# Patient Record
Sex: Male | Born: 1955 | Race: White | Hispanic: No | Marital: Married | Smoking: Never smoker
Health system: Southern US, Community
[De-identification: ages and names within clinical notes are randomized; demographics above are authoritative.]

## PROBLEM LIST (undated history)

## (undated) DIAGNOSIS — G459 Transient cerebral ischemic attack, unspecified: Secondary | ICD-10-CM

## (undated) DIAGNOSIS — I10 Essential (primary) hypertension: Secondary | ICD-10-CM

## (undated) DIAGNOSIS — I639 Cerebral infarction, unspecified: Secondary | ICD-10-CM

## (undated) DIAGNOSIS — M1990 Unspecified osteoarthritis, unspecified site: Secondary | ICD-10-CM

## (undated) DIAGNOSIS — R011 Cardiac murmur, unspecified: Secondary | ICD-10-CM

## (undated) HISTORY — PX: APPENDECTOMY (OPEN): SHX54

## (undated) HISTORY — PX: EYE SURGERY: SHX253

## (undated) HISTORY — PX: APPENDECTOMY: SHX54

## (undated) HISTORY — PX: COLONOSCOPY: SHX174

## (undated) HISTORY — PX: ESOPHAGOGASTRODUODENOSCOPY: SHX1529

## (undated) HISTORY — PX: OTHER SURGICAL HISTORY: SHX169

---

## 1997-10-29 ENCOUNTER — Other Ambulatory Visit: Admission: RE | Admit: 1997-10-29 | Discharge: 1997-10-29 | Payer: Self-pay | Admitting: Family Medicine

## 1998-02-08 ENCOUNTER — Other Ambulatory Visit: Admission: RE | Admit: 1998-02-08 | Discharge: 1998-02-08 | Payer: Self-pay | Admitting: Family Medicine

## 2014-11-07 ENCOUNTER — Observation Stay
Admission: EM | Admit: 2014-11-07 | Discharge: 2014-11-09 | Disposition: A | Discharge status: Home / Self Care | Payer: Commercial Managed Care - POS | Attending: Internal Medicine | Admitting: Internal Medicine

## 2014-11-07 ENCOUNTER — Observation Stay: Payer: Commercial Managed Care - POS

## 2014-11-07 ENCOUNTER — Observation Stay: Payer: Commercial Managed Care - POS | Admitting: Internal Medicine

## 2014-11-07 ENCOUNTER — Emergency Department: Payer: Commercial Managed Care - POS

## 2014-11-07 DIAGNOSIS — R030 Elevated blood-pressure reading, without diagnosis of hypertension: Secondary | ICD-10-CM | POA: Insufficient documentation

## 2014-11-07 DIAGNOSIS — G459 Transient cerebral ischemic attack, unspecified: Principal | ICD-10-CM | POA: Insufficient documentation

## 2014-11-07 DIAGNOSIS — E781 Pure hyperglyceridemia: Secondary | ICD-10-CM | POA: Insufficient documentation

## 2014-11-07 LAB — CBC AND DIFFERENTIAL
Basophils Absolute Automated: 0.02 10*3/uL (ref 0.00–0.20)
Basophils Automated: 0 %
Eosinophils Absolute Automated: 0.18 10*3/uL (ref 0.00–0.70)
Eosinophils Automated: 4 %
Hematocrit: 41.8 % — ABNORMAL LOW (ref 42.0–52.0)
Hgb: 14.9 g/dL (ref 13.0–17.0)
Immature Granulocytes Absolute: 0.01 10*3/uL
Immature Granulocytes: 0 %
Lymphocytes Absolute Automated: 2.41 10*3/uL (ref 0.50–4.40)
Lymphocytes Automated: 46 %
MCH: 31.4 pg (ref 28.0–32.0)
MCHC: 35.6 g/dL (ref 32.0–36.0)
MCV: 88 fL (ref 80.0–100.0)
MPV: 9.9 fL (ref 9.4–12.3)
Monocytes Absolute Automated: 0.39 10*3/uL (ref 0.00–1.20)
Monocytes: 8 %
Neutrophils Absolute: 2.18 10*3/uL (ref 1.80–8.10)
Neutrophils: 42 %
Nucleated RBC: 0 /100 WBC (ref 0–1)
Platelets: 211 10*3/uL (ref 140–400)
RBC: 4.75 10*6/uL (ref 4.70–6.00)
RDW: 13 % (ref 12–15)
WBC: 5.18 10*3/uL (ref 3.50–10.80)

## 2014-11-07 LAB — ECG 12-LEAD
Atrial Rate: 77 {beats}/min
P Axis: 60 degrees
P-R Interval: 138 ms
Q-T Interval: 376 ms
QRS Duration: 82 ms
QTC Calculation (Bezet): 425 ms
R Axis: 63 degrees
T Axis: 55 degrees
Ventricular Rate: 77 {beats}/min

## 2014-11-07 LAB — LIPID PANEL
Cholesterol / HDL Ratio: 4.7
Cholesterol: 160 mg/dL (ref 0–199)
HDL: 34 mg/dL — ABNORMAL LOW (ref >40)
LDL Calculated: 84 mg/dL (ref 0–99)
Triglycerides: 212 mg/dL — ABNORMAL HIGH (ref 34–149)
VLDL Calculated: 42 mg/dL — ABNORMAL HIGH (ref 10–40)

## 2014-11-07 LAB — URINALYSIS, REFLEX TO MICROSCOPIC EXAM IF INDICATED
Bilirubin, UA: NEGATIVE
Blood, UA: NEGATIVE
Glucose, UA: NEGATIVE
Ketones UA: NEGATIVE
Leukocyte Esterase, UA: NEGATIVE
Nitrite, UA: NEGATIVE
Protein, UR: NEGATIVE
Specific Gravity UA: 1.013 (ref 1.001–1.035)
Urine pH: 6 (ref 5.0–8.0)
Urobilinogen, UA: NORMAL mg/dL

## 2014-11-07 LAB — BASIC METABOLIC PANEL
Anion Gap: 8 (ref 5.0–15.0)
BUN: 21 mg/dL (ref 9–28)
CO2: 24 mEq/L (ref 22–29)
Calcium: 9.3 mg/dL (ref 8.5–10.5)
Chloride: 111 mEq/L (ref 100–111)
Creatinine: 1 mg/dL (ref 0.7–1.3)
Glucose: 105 mg/dL — ABNORMAL HIGH (ref 70–100)
Potassium: 3.9 mEq/L (ref 3.5–5.1)
Sodium: 143 mEq/L (ref 136–145)

## 2014-11-07 LAB — PT AND APTT
PT INR: 0.8 — ABNORMAL LOW (ref 0.9–1.1)
PT: 11.7 s — ABNORMAL LOW (ref 12.6–15.0)
PTT: 25 s (ref 23–37)

## 2014-11-07 LAB — HEMOGLOBIN A1C: Hemoglobin A1C: 5.5 % (ref 0.0–6.0)

## 2014-11-07 LAB — GFR: EGFR: >60

## 2014-11-07 LAB — PHOSPHORUS: Phosphorus: 3 mg/dL (ref 2.3–4.7)

## 2014-11-07 LAB — TSH: TSH: 2.91 u[IU]/mL (ref 0.35–4.94)

## 2014-11-07 LAB — GLUCOSE WHOLE BLOOD - POCT: Whole Blood Glucose POCT: 106 mg/dL — ABNORMAL HIGH (ref 70–100)

## 2014-11-07 LAB — TROPONIN I: Troponin I: 0.02 ng/mL (ref 0.00–0.09)

## 2014-11-07 LAB — HEMOLYSIS INDEX: Hemolysis Index: 10 (ref 0–18)

## 2014-11-07 LAB — MAGNESIUM: Magnesium: 2.2 mg/dL (ref 1.6–2.6)

## 2014-11-07 MED ORDER — SODIUM CHLORIDE 0.9 % IV SOLN
INTRAVENOUS | Status: DC
Start: 2014-11-07 — End: 2014-11-07

## 2014-11-07 MED ORDER — MORPHINE SULFATE 2 MG/ML IJ/IV SOLN (WRAP)
2.0000 mg | Status: AC | PRN
Start: 2014-11-07 — End: 2014-11-07

## 2014-11-07 MED ORDER — ATORVASTATIN CALCIUM 20 MG PO TABS
40.0000 mg | ORAL_TABLET | Freq: Every evening | ORAL | Status: DC
Start: 2014-11-07 — End: 2014-11-09
  Administered 2014-11-07 – 2014-11-08 (×2): 40 mg via ORAL
  Filled 2014-11-07 (×2): qty 2

## 2014-11-07 MED ORDER — ENOXAPARIN SODIUM 40 MG/0.4ML SC SOLN
40.0000 mg | Freq: Every morning | SUBCUTANEOUS | Status: DC
Start: 2014-11-07 — End: 2014-11-09
  Administered 2014-11-07 – 2014-11-09 (×3): 40 mg via SUBCUTANEOUS
  Filled 2014-11-07 (×3): qty 0.4

## 2014-11-07 MED ORDER — ONDANSETRON HCL 4 MG/2ML IJ SOLN
4.0000 mg | Freq: Four times a day (QID) | INTRAMUSCULAR | Status: AC | PRN
Start: 2014-11-07 — End: 2014-11-07

## 2014-11-07 MED ORDER — ASPIRIN 81 MG PO CHEW
81.0000 mg | CHEWABLE_TABLET | Freq: Every day | ORAL | Status: DC
Start: 2014-11-08 — End: 2014-11-09
  Administered 2014-11-08 – 2014-11-09 (×2): 81 mg via ORAL
  Filled 2014-11-07 (×2): qty 1

## 2014-11-07 NOTE — Plan of Care (Signed)
Problem: Day of Admission- Stroke  Goal: Neurological status is stable or improving  Outcome: Progressing  Patient states left arm tingling sensation getting better and sensation coming back.   Otherwise neurological exam benign.  Goal: Patient/Patient Care Companion demonstrates understanding on disease process, treatment plan, medications and discharge plan and consequences of noncompliance.  Outcome: Progressing  Patient Admitted For: TIA  Current Assessed Risk Factors:              Non-modifiable (age, gender, race, family history,              Modifiable (smoking, obesity, alcohol, illegal drug use): n/a              Modifiable with Doctor's help (hypertension, DM,carotid, A-fib, high chol):  n/a              Doctor prescribed: Lovenox, Lipitor  Patient/Family Stroke Prevention Goals: list stated lifestyle changes/goals  Education Materials Given To: patient  Response To Teaching As Evidenced By: (summarize patient/family response) verbalized signs / symptoms  Goals For Today (imaging, lab tests, q 4hr neuro checks, education): PT/OT; MRI; US carotid, Neuro consult        Problem: Safety  Goal: Patient will be free from injury during hospitalization  Outcome: Progressing  Received patient resting in bed. Patient is alert, oriented x 4.   Ambulatory, independent.    Problem: Pain  Goal: Patient's pain/discomfort is manageable  Outcome: Progressing  Patient denies any pain / discomforts at this time.

## 2014-11-07 NOTE — Consults (Signed)
Service Date: 11/07/2014     Patient Type: V     CONSULTING PHYSICIAN: Shawnee Knapp MD     REFERRING PHYSICIAN: Ramond Dial MD     CONSULTING SERVICE:  Neurology.       REASON FOR CONSULTATION:  Transient left arm numbness and weakness.     HISTORY OF PRESENT ILLNESS:  The patient is a 59 year old overall healthy man who comes into the  hospital with above complaints.  The patient woke up at 2:30 a.m., his left  arm was numb.  He also could not use his left hand properly to tie buttons  or shoelaces.  The patient came to the ER within an hour.  Due to low NIH  stroke scale, improving symptoms, he is not felt to be a candidate for  thrombolytic therapy.  The patient afebrile, hemodynamically stable, blood  pressure elevated at 164/101.  CT brain scan is negative.  The patient is  admitted to the hospital for further evaluation.  His symptoms lasted about  3 hours, completely resolved.  He denies any headache, dizziness, facial  numbness, slurred speech, facial droop, no chest pain, palpitation, cough,  hemoptysis, abdominal pain, diarrhea, dysuria, hematuria.  No head or neck  trauma, no neck pain, no problem with left leg such as numbness, tingling,  weakness.     PAST MEDICAL HISTORY:  Unremarkable.  The patient's blood pressure elevated, but he has never been  diagnosed with hypertension.  He was also noted to have mild greatly  increased triglycerides of 212.     PAST SURGICAL HISTORY:  Vasectomy.     FAMILY HISTORY:  Father died of ruptured aortic aneurysm at age 66.  Mother is living in  good health at age 25.     SOCIAL HISTORY:  Nonsmoker, no alcohol or drug abuse.  Lives in [place].     HOME MEDICATIONS:  None regularly.     ALLERGIES:  None known.     REVIEW OF SYSTEMS:  A 10-point review of systems is negative other than covered above.     PHYSICAL EXAMINATION:  VITAL SIGNS:  Temperature 97.7, pulse 72, respirations 18, blood pressure  149/87, oxygen saturation 98%.  GENERAL:  Pleasant,  cooperative, middle-aged man lying in bed in no acute  distress.  HEAD:  No external signs of trauma.  NECK:  Supple.  ENT:  Unremarkable.  SKIN:  Warm, dry.  EXTREMITIES:  Without clubbing, cyanosis or edema.  Peripheral pulses  intact.  NEUROLOGIC:  Shows patient to be awake, alert, oriented to time, place,  person and situation.  Speech is clear.  Language is normal.  Extraocular  movements full.  Pupils equal, round, reactive.  Face symmetric.  Tongue  midline.  Good strength in arms and legs, no drift.  Negative Babinski.  HEART:  Sounds normal, no murmurs, no carotid bruits.  No signs of  meningeal irritation.  Sensations and reflexes are intact and symmetric.     LABORATORY DATA:  Review of laboratory and imaging data shows unremarkable CBC, normal  chemistry panel:  Creatinine 1, glucose 105, sodium 143, cholesterol 160,  triglyceride 212, LDL 84, HDL 34.  CT brain scan:  No acute intracranial  infarct.  Brain MRI done without contrast:  Mild nonspecific white matter  disease, no acute infarct.     IMPRESSION:  A 59 year old overall healthy man comes in with a 3-hour episode of left  arm numbness and weakness.  Symptoms have completely resolved.  Most  likely  diagnosis is transient ischemic attack, possibility of pinched nerve from  lying on the left arm in and incorrect position is not entirely excluded  and is a consideration in differential diagnosis.  The patient's blood  pressure has been borderline elevated and he has got mild dyslipidemia with  elevated triglycerides.     RECOMMENDATIONS:  Brain MRI to look for infarct has been negative.  Carotid Doppler is  pending.  The patient's symptoms have resolved.  Recommendation is to treat  patient with aspirin, medications for dyslipidemia.  If the blood pressure  continues to run high, the patient will need medications for that.   Treatment of modifiable risk factors to reduce future risk of strokes and  TIAs.     Diagnosis, recommendations and plan of  care discussed with the patient.   Care coordinated with the hospitalist team and the nursing staff.  If  patient's carotid Doppler is negative, he can be discharged with outpatient  neurology and primary care followup.           D:  11/07/2014 17:51 PM by Dr. Shawnee Knapp, MD ([id number])  T:  11/07/2014 18:26 PM by [id number]      Everlean Cherry: [id number]) (Doc ID: [id number])

## 2014-11-07 NOTE — H&P (Signed)
Patient Type: V    PRIMARY CARE PHYSICIAN:  The patient does not have a local primary care doctor.     CHIEF COMPLAINT:  Left upper extremity numbness.     HISTORY OF PRESENT ILLNESS:  Matthew Dudley is a 59 year old male with no significant past medical history  who presented to the ED earlier tonight secondary to acute onset of left  upper extremity numbness.  The patient states that he was awoken in the  middle of the night with flailing left upper extremity and numbness.  The  patient at that time denied any other associated symptoms, no associated  headache, no vision changes, no difficulty in swallowing, no slurred  speech.  He denied any associated chest pressure or chest discomfort.  No  left lower extremity paresthesias or weakness.  He denied any associated  left upper extremity weakness; however, he did note some flailing of the  extremity.  While present in the emergency department, he stated his  symptoms have resolved.     PAST MEDICAL HISTORY:  None.     CURRENT MEDICATIONS:  None.     ALLERGIES:  No known drug allergies.     SOCIAL HISTORY:  The patient is married and resides in [place], [place].  The  patient is currently visiting the area for work.  He is an Doctor, general practice.  Denies any history of tobacco use.  No significant alcohol use,  he reports only on occasion.  No illicit drug use reported.  The patient  states he does not have an advanced directive and is full code.     FAMILY HISTORY:  The patient was asked and reports noncontributory.     REVIEW OF SYSTEMS:  Twelve systems were reviewed.  Pertinent positives per the HPI, which  include recent onset of left upper extremity numbness with associated arm  flailing per the patient's report.  The patient denies any other associated  symptoms.  No fevers, chills.  No headaches, no vision changes, no sore  throat, no difficulty in swallowing, no difficulty with speech or aphasia.   He denies any associated chest pain, chest  pressure, or palpitations.  No  shortness of breath, no cough, no abdominal pain, no nausea or changes in  bowels.  No unilateral weakness.  All other systems were reviewed and were  negative.     PHYSICAL EXAMINATION:  VITAL SIGNS:  Blood pressure 153/88, heart rate 63, respiratory rate 17,  oxygen saturation 98% on room air, temperature current 98.2.  GENERAL:  No acute distress.  HEENT:  Head normocephalic, atraumatic.  Oropharynx clear with moist mucous  membranes.  NECK:  Supple, no JVD, no carotid bruits.  CARDIOVASCULAR:  Regular rate and rhythm, no murmurs.  LUNGS:  Clear auscultation bilaterally, no wheezes, rales, or rhonchi.  ABDOMEN:  Soft, nontender, nondistended, normoactive bowel sounds.  EXTREMITIES:  No cyanosis, clubbing, or edema.  NEUROLOGIC:  Cranial nerves II-XII are grossly intact.  He moves all  extremities, 5/5 muscle strength bilateral upper and lower extremities.   Bilateral upper extremity sensation intact and symmetrical.  No focal  neurologic deficit appreciated on exam.  SKIN:  No visual rashes or lesions.  PSYCHIATRIC:  The patient is awake, alert and oriented x3.     LABORATORY AND DIAGNOSTIC DATA:  WBC is 5.18, hemoglobin 14.9, hematocrit 41.8, platelets 211, MCV 88.   Sodium 143, potassium 3.9, chloride 111, bicarbonate 24, BUN 21 creatinine  is 1.0, glucose of 105, calcium 9.3.  Troponin 0.02.  INR 0.8.  Urinalysis  unremarkable.  Chest x-ray shows no acute findings.  CT of the head without  contrast shows no acute findings.     ASSESSMENT AND PLAN:  The patient is a 59 year old male with no significant past medical history  who presents to the emergency department earlier today secondary to acute  onset of left upper extremity numbness.    1.  Transient ischemic attack.  The patient will be admitted to observation  and monitored on telemetry overnight.  The patient will have MRI of the  brain as well as carotid Dopplers performed.  We will check fasting lipid  panel.  We will  consult neurology for further evaluation.  The patient has  received aspirin prior to arrival to the ED.  We will start statin and  daily aspirin dose.  2.  Hypertension.  Monitor blood pressure closely.  3.  Deep venous thrombosis prophylaxis with Lovenox.  4.  Code status:  Full code.           D:  11/07/2014 06:53 AM by Dr. Ramond Dial, MD ([id number])  T:  11/07/2014 14:31 PM by NTS      (Conf: [id number]) (Doc ID: [id number])

## 2014-11-07 NOTE — ED Provider Notes (Signed)
Physician/Midlevel provider first contact with patient: 11/07/14 0330         [facility] EMERGENCY DEPARTMENT HISTORY AND PHYSICAL EXAM    Patient Name: Matthew Dudley, Matthew Dudley  Encounter Date:  11/07/2014  Rendering Provider: Venancio Poisson, MD  Patient DOB:  12/01/1955  MRN:  [id number]    History of Presenting Illness     Historian: Patient    59 y.o. male with h/o appendectomy p/w persistent left arm numbness distal to the elbow since waking up 1 hour ago (3:30 AM). Patient states he did not have these sxs when he went to sleep at 11 PM last night. He notes he took 3 ASA just PTA. Denies any facial numbness or slurred speech.     PMD:  Md, Out Of Network    Past Medical History     History reviewed. No pertinent past medical history.    Past Surgical History     Past Surgical History   Procedure Laterality Date   . Appendectomy         Family History     History reviewed. No pertinent family history.    Social History     History     Social History   . Marital Status: Married     Spouse Name: N/A   . Number of Children: N/A   . Years of Education: N/A     Social History Main Topics   . Smoking status: Never Smoker    . Smokeless tobacco: Not on file   . Alcohol Use: Yes   . Drug Use: No   . Sexual Activity: Not on file     Other Topics Concern   . Not on file     Social History Narrative   . No narrative on file       Home Medications     Home medications reviewed by ED MD     There are no discharge medications for this patient.      Review of Systems     Neuro: +Arm numbness. No facial numbness or slurred speech  All other systems reviewed and negative.     Physical Exam     BP 138/93 mmHg  Pulse 66  Temp(Src) 97.5 F (36.4 C) (Oral)  Resp 19  Ht 5\' 6"  (1.676 m)  Wt 69.536 kg  BMI 24.75 kg/m2  SpO2 95%    CONSTITUTIONAL: Vital signs reviewed, Well appearing, Alert and oriented X 3.  HEAD: Atraumatic, Normocephalic.  EYES: Eyes are normal to inspection, Sclera are normal, Conjunctiva are normal.  ENT:  Ears normal to inspection, Nose examination normal.  NECK: Normal ROM.  RESPIRATORY CHEST: Breath sounds normal, No respiratory distress.  CARDIOVASCULAR: RRR, No murmurs, Normal S1 S2.  ABDOMEN: Abdomen is nontender, No distension.  UPPER EXTREMITY: Inspection normal, Normal range of motion.  NEURO: GCS Is 15, No focal motor deficits. Decreased sensation from elbow distally on the left, strength is normal  SKIN: Skin is warm, Skin Is dry.  PSYCHIATRIC: Oriented X 3, Normal affect.    ED Medications Administered     ED Medication Orders     None          Orders Placed During This Encounter     Orders Placed This Encounter   Procedures   . Stroke/tPA Management   . Chest AP Portable   . CT Head WO Contrast   . MRI brain without contrast   . US CAROTID DUPLEX DOPP COMP   .  Basic Metabolic Panel   . CBC and differential   . PT/APTT   . Troponin I   . UA, Reflex to Microscopic   . GFR   . Hemoglobin A1c   . CBC   . Basic Metabolic Panel   . Magnesium   . Phosphorus   . Lipid panel   . TSH   . Hemolysis index   . Diet cardiac   . Glucose POC   . Cardiac monitoring (ED ONLY)   . Dysphagia Screen   . Nasal Cannula Low-Flow (5 lpm or Less)   . Activity as Tolerated   . Cardiac Monitor-May transport OFF monitor   . ED Holding Orders Expire in 8 Hours   . Notify Admitting Attending ( Change in Condition)   . Notify Attending of Patient Arrival to Floor within 8 Hours   . Notify Physician (Vital Signs)   . Notify Physician (Lab Results)   . Vital Signs Q4HR   . Vital signs with pulse ox (Q4H)   . Up as tolerated   . Notify physician (specify)   . Document Stroke Score   . Nursing swallow assessment   . Neuro checks   . Intermittent Pneumatic Compression   . Telemetry 24 Hour Protocol   . Full Code   . OT eval and treat   . OT eval and treat   . PT EVALUATE AND TREAT   . PT EVALUATE AND TREAT   . Glucose Whole Blood - POCT   . ECG 12 lead   . EKG SCAN   . EKG SCAN   . Echocardiogram Adult Comp W Clr/Dop/Bubble   . Saline lock  IV   . Place (admit) for Observation Services   . Place  for Observation Services   . Einar Gip ED Bed Request         Diagnostic Study Results     Labs  Results     Procedure Component Value Units Date/Time    TSH [[id number]] Collected:  11/07/14 0340     Thyroid Stimulating Hormone 2.91 uIU/mL Updated:  11/07/14 1614    Hemoglobin A1c [[id number]] Collected:  11/07/14 0340    Specimen Information:  Blood Updated:  11/07/14 1412     Hemoglobin A1C 5.5 %     Lipid panel [[id number]]  (Abnormal) Collected:  11/07/14 0340     Cholesterol 160 mg/dL Updated:  08/65/78 4696     Triglycerides 212 (H) mg/dL      HDL 34 (L) mg/dL      LDL Calculated 84 mg/dL      VLDL Cholesterol Cal 42 (H) mg/dL      CHOL/HDL Ratio 4.7     Hemolysis index [[id number]] Collected:  11/07/14 0340     Hemolysis Index 10 Updated:  11/07/14 1406    Magnesium [[id number]] Collected:  11/07/14 0340     Magnesium 2.2 mg/dL Updated:  36/64/40 3474    Phosphorus [[id number]] Collected:  11/07/14 0340     Phosphorus 3.0 mg/dL Updated:  64/33/29 5188    UA, Reflex to Microscopic [[id number]] Collected:  11/07/14 0515    Specimen Information:  Urine Updated:  11/07/14 0524     Urine Type Clean Catch      Color, UA Yellow      Clarity, UA Clear      Specific Gravity UA 1.013      Urine pH 6.0      Leukocyte Esterase, UA Negative      Nitrite,  UA Negative      Protein, UR Negative      Glucose, UA Negative      Ketones UA Negative      Urobilinogen, UA Normal mg/dL      Bilirubin, UA Negative      Blood, UA Negative     Troponin I [[id number]] Collected:  11/07/14 0340    Specimen Information:  Blood Updated:  11/07/14 0419     Troponin I 0.02 ng/mL     Basic Metabolic Panel [[id number]]  (Abnormal) Collected:  11/07/14 0340    Specimen Information:  Blood Updated:  11/07/14 0400     Glucose 105 (H) mg/dL      BUN 21 mg/dL      Creatinine 1.0 mg/dL      CALCIUM 9.3 mg/dL      Sodium 742 mEq/L      Potassium 3.9 mEq/L      Chloride 111 mEq/L      CO2 24 mEq/L       Anion Gap 8.0     GFR [[id number]] Collected:  11/07/14 0340     EGFR >60.0 Updated:  11/07/14 0400    PT/APTT [[id number]]  (Abnormal) Collected:  11/07/14 0340     PT 11.7 (L) sec Updated:  11/07/14 0357     PT INR 0.8 (L)      PT Anticoag. Given Within 48 hrs. None      PTT 25 sec     CBC and differential [[id number]]  (Abnormal) Collected:  11/07/14 0340    Specimen Information:  Blood / Blood Updated:  11/07/14 0346     WBC 5.18 x10 3/uL      Hgb 14.9 g/dL      Hematocrit 60.1 (L) %      Platelets 211 x10 3/uL      RBC 4.75 x10 6/uL      MCV 88.0 fL      MCH 31.4 pg      MCHC 35.6 g/dL      RDW 13 %      MPV 9.9 fL      Neutrophils 42 %      Lymphocytes Automated 46 %      Monocytes 8 %      Eosinophils Automated 4 %      Basophils Automated 0 %      Immature Granulocyte 0 %      Nucleated RBC 0 /100 WBC      Neutrophils Absolute 2.18 x10 3/uL      Abs Lymph Automated 2.41 x10 3/uL      Abs Mono Automated 0.39 x10 3/uL      Abs Eos Automated 0.18 x10 3/uL      Absolute Baso Automated 0.02 x10 3/uL      Absolute Immature Granulocyte 0.01 x10 3/uL     Glucose Whole Blood - POCT [[id number]]  (Abnormal) Collected:  11/07/14 0338     POCT - Glucose Whole blood 106 (H) mg/dL Updated:  22/02/54 2706        Labs Reviewed   BASIC METABOLIC PANEL - Abnormal; Notable for the following:     Glucose 105 (*)     All other components within normal limits   CBC AND DIFFERENTIAL - Abnormal; Notable for the following:     Hematocrit 41.8 (*)     All other components within normal limits   PT AND APTT - Abnormal; Notable for the following:     PT  11.7 (*)     PT INR 0.8 (*)     All other components within normal limits   LIPID PANEL - Abnormal; Notable for the following:     Triglycerides 212 (*)     HDL 34 (*)     VLDL Cholesterol Cal 42 (*)     All other components within normal limits   GLUCOSE WHOLE BLOOD - POCT - Abnormal; Notable for the following:     POCT - Glucose Whole blood 106 (*)     All other components within normal  limits   TROPONIN I   URINALYSIS, REFLEX TO MICROSCOPIC EXAM IF INDICATED   GFR   HEMOGLOBIN A1C   MAGNESIUM   PHOSPHORUS   TSH   HEMOLYSIS INDEX         Radiologic Studies  Radiology Results (24 Hour)     Procedure Component Value Units Date/Time    US CAROTID DUPLEX DOPP COMP [[id number]] Collected:  11/07/14 1739    Order Status:  Completed Updated:  11/07/14 1744    Narrative:      HISTORY: TIA    DUPLEX CAROTID DOPPLER:  The extracranial carotid arteries and proximal vertebral arteries were  evaluated with high resolution imaging, color Doppler, and spectral  Doppler techniques.      There are minimal carotid plaque seen bilaterally.  Flow velocities are  not elevated.  Low resistance flow pattern is seen in the common carotid  and internal carotid arteries bilaterally.  Vertebral arteries show  antegrade flow bilaterally.  Following maximal velocities were measured:    MEASURED VELOCITIES:    Right Distal CCA                    54cm/sec      Right Distal ICA (peak systolic)    70cm/sec  Right Distal ICA (end diastolic)     32cm/sec             Left Distal CCA                     61cm/sec                  Left Distal ICA (peak systolic)     94cm/sec  Left Distal ICA (end diastolic)      45cm/sec           Impression:          No hemodynamically significant stenosis seen in bilateral carotid  arteries.    Kristine Linea, MD   11/07/2014 5:40 PM      MRI brain without contrast [[id number]] Collected:  11/07/14 1333    Order Status:  Completed Updated:  11/07/14 1339    Narrative:      History: TIA.    TECHNIQUE: Noncontrast multiplanar MR imaging of the brain was performed  on a 1.5 Tesla MRI scanner.    COMPARISON: CT of the brain dated 11/07/2014.    FINDINGS: There are mild scattered punctate foci of T2 and FLAIR  hyperintense signal within the cerebral white matter. These lesions are  nonspecific and similar finding can be seen in the setting of chronic  small vessel ischemic change, migraine headaches, gliosis  from prior  infectious/inflammatory processes, vasculitis or demyelination.  Statistically, these lesions likely reflect chronic small vessel  ischemic change in a patient of this age. There is no restricted  diffusion to suggest acute infarction. There is no evidence of acute  intracranial  hemorrhage. The ventricles are age-appropriate. There is no  hydrocephalus. There is no mass effect or midline shift. No extra-axial  collections are seen. Note is made of a partially empty sella. The  corpus callosum is normally formed. The cerebellar tonsils are normally  positioned. The major intracranial flow voids are preserved. The orbits  appear unremarkable. The visualized portions of the paranasal sinuses  are clear.      Impression:       No acute intracranial abnormality. Mild nonspecific white  matter changes which may reflect chronic small vessel ischemic change in  a patient of this age.    Georgann Housekeeper, MD   11/07/2014 1:35 PM      Chest AP Portable [[id number]] Collected:  11/07/14 0410    Order Status:  Completed Updated:  11/07/14 0416    Narrative:      INDICATION: Code neuro.  59 year old male. Suspected stroke.            COMPARISON: No comparison study.          TECHNIQUE: XR CHEST AP PORTABLE: AP portable upright    FINDINGS: No indwelling lines are appreciated. Multiple cardiac  monitoring leads are noted.      The lungs are well-expanded and clear.       There is no evidence of pulmonary edema.      No pleural effusion or  pneumothorax is seen.      The heart is top normal in size.      There  is no mediastinal shift and the upper mediastinum does not appear  widened.      Hilar structures are within normal limits.       There is  no apparent subdiaphragmatic free air.          No significant bony  lesions are seen.               Impression:       Portable AP radiographic examination of the chest shows no  acute abnormality. The heart appears top normal in size.     There is no  evidence of CHF or  pneumonia.                 Miguel Dibble, MD   11/07/2014 4:11 AM      CT Head WO Contrast [[id number]] Collected:  11/07/14 0359    Order Status:  Completed Updated:  11/07/14 0404    Narrative:      INDICATION: left sided numbness.  59 year old male presents with  numbness in the left arm and hand.                    COMPARISON: No prior study for comparison.           TECHNIQUE:  CT HEAD WO CONTRAST: Axial imaging of the brain was  performed from base to vertex.  Scans were performed without IV  contrast.        FINDINGS:      The scalp and calvarium appear normal without acute or  significant abnormalities.       There is no midline shift or other mass  effect.  No intracranial hemorrhage is seen.  The ventricles and sulci  appear unremarkable.  There is no hydrocephalus.  No territorial  infarcts are identified.  The basal cisterns are clear.  No extra-axial  lesions are identified.          The skull base, middle  ear structures,  and mastoid air cells are grossly unremarkable.      The visualized  facial structures are essentially unremarkable and show no acute  abnormality.                  Impression:       CT scan of the head shows no hydrocephalus, herniation, or  intracranial hemorrhage. There is no visible acute intracranial  abnormality.       An acute stroke, if present, is not visible on this  examination.  Correlate clinically to determine whether further imaging  with MRI is indicated.                       Miguel Dibble, MD   11/07/2014 4:00 AM              Monitors, EKG     EKG (interpreted by ED physician): NSR at 80, no ST/T wave changes    Cardiac Monitor (interpreted by ED physician): NSR at 80, no ST/T wave changes      Injury Mechanism               Critical Care     None     Clinical Course / MDM     Working Differential (not completely inclusive):   Harvard Zeiss is a 59 y.o. male who woke from sleep with numbness in his left lower arm. Code NEURO called. Will work up and reccomended  admission.     Notes:     4:05 AM Patient reports sensation is returning in his left arm.     4:45 AM Sxs have nearly resolved. Discussed with patient importance of admission for TIA work-up. He agrees.     4:50 AM Discussed with Dr. Patsy Lager [facility]) who accepts patient for admission to CDU Tele for TIA.     Code NEUROCalled  Last known well: 11 PM  ED RN NIH Stroke Score: 1    Stroke/tPA Management  Date/Time: 11/07/2014 4:44 AM  Performed by: Venancio Poisson  Authorized by: Venancio Poisson  Stroke/tPA: Best estimate of time since last normal exceeds the recommended window for tPA treatment..Reasons for not giving tPA at < 3 hours: Minimal deficit on NIHSS on arrival.        Discussion of abnormal results/incidental findings:         Data Review     Nursing records reviewed and agree: Yes    Laboratory results reviewed by ED provider (yes/no): Yes    Radiologic study results reviewed by ED provider (yes/no): Yes    I, Venancio Poisson, MD, personally performed the services documented. Sherrilyn Rist is scribing for me on Boyden,Har[place] ANTHONY. I reviewed and confirm the accuracy of the information in this medical record.    I, Sherrilyn Rist, am serving as a Neurosurgeon to document services personally performed by Venancio Poisson, MD, based on the provider's statements to me.     Rendering Provider: Venancio Poisson, MD      Diagnosis and Disposition     Clinical Impression  1. Transient cerebral ischemia, unspecified type        Disposition  ED Disposition     Admit Bed Type: Telemetry [5]  Bed request comments: Dx: TIA- CDU Tele Obs  Admitting Physician: Dawayne Patricia [[id number]]  Patient Class: Observation [104]            Prescriptions  There are no discharge medications for this patient.          Venancio Poisson, MD  11/07/14 [phone]

## 2014-11-07 NOTE — Progress Notes (Signed)
Received patient back from MRI, tele placed. All other needs attended. U/S carotid not yet done.

## 2014-11-07 NOTE — Plan of Care (Signed)
Problem: Day of Admission- Stroke  Goal: Patient/Patient Care Companion demonstrates understanding on disease process, treatment plan, medications and discharge plan and consequences of noncompliance.  Outcome: Progressing  Patient Admitted For: left arm numbness  Current Assessed Risk Factors:              Non-modifiable (age, gender, race, family history, previous stroke or TIA): gender              Modifiable (smoking, obesity, alcohol, illegal drug use): n/a              Modifiable with Doctor's help (hypertension, DM,carotid, A-fib, high chol):  n/a              Doctor prescribed: to be determined, physician into see patient now  Patient/Family Stroke Prevention Goals: (list stated lifestyle changes/goals) "Go to PCP"  Education Materials Given To: patient  Response To Teaching As Evidenced By: (summarize patient/family response) patient understands S/S of stroke  Goals For Today (imaging, lab tests, q 4hr neuro checks, education): MRI, q4 neuro checks, AM labs, continued education

## 2014-11-07 NOTE — Progress Notes (Signed)
Vascular Lab called at [phone] and left a message regarding patient's testing.

## 2014-11-07 NOTE — Plan of Care (Signed)
Problem: Day of Admission- Stroke  Goal: Patient/Patient Care Companion demonstrates understanding on disease process, treatment plan, medications and discharge plan and consequences of noncompliance.  Outcome: Progressing  Patient Admitted For: left arm numbness   Current Assessed Risk Factors:              Non-modifiable (age, gender, race, family history, previous stroke or TIA): age, gender              Modifiable (smoking, obesity, alcohol, illegal drug use): n/a              Modifiable with Doctor's help (hypertension, DM,carotid, A-fib, high chol):  High cholesterol               Doctor prescribed: asa, lipitor, lovenox  Patient/Family Stroke Prevention Goals: (list stated lifestyle changes/goals) Diet modification, medication compliance   Education Materials Given To: patient  Response To Teaching As Evidenced By: (summarize patient/family response) Patient verbalized understanding   Goals For Today (imaging, lab tests, q 4hr neuro checks, education): ECHO, Q4 neuro checks, continued education

## 2014-11-07 NOTE — PT Eval Note (Signed)
[facility]  Physical Therapy Evaluation    Patient: Matthew Dudley MRN: [id number]   Unit: [facility]    Bed: V409/W119-14    Medicare ID # - Patient is not covered by Medicare.     Assessment:   Patient is independent w/ all assessed activities and may discharge home when medically able. No continued inpatient physical therapy needs.   Discharge therapy.    Interdisciplinary Communication:   Patient is in bed and call bell within reach.  Spoke with RN regarding results of evaluation.    Plan:   Goals:  All physical therapy goals met on evaluation.  Discontinue PT.    Discharge Recommendation: Home with family  DME Recommended for Discharge: none    Education:   Educated patient on role of physical therapy and no further needs for inpatient PT.  Patient verbalized understanding and in agreement with discharge.    Evaluation:   Consult received for Juliann Pares [facility] for PT evaluation and treatment.  Chart reviewed.  Patient's medical condition is appropriate for Physical Therapy intervention at this time.     Medical Diagnosis: Transient cerebral ischemia, unspecified type [G45.9]         History of Present Illness: Matthew Dudley is a 59 y.o. male admitted on 11/07/2014 with numbness to left arm and left hand.    Patient Active Problem List   Diagnosis   . TIA (transient ischemic attack)     History reviewed. No pertinent past medical history.  Past Surgical History   Procedure Laterality Date   . Appendectomy       Prior Level of Function   Prior level of function: Ambulates / Performs ADL's independently   Baseline Activity Level: Community ambulation   DME Currently at Home: none    Home Living Arrangements    home w/ family in [place].  Up in area for business.    Subjective: Patient is agreeable to participation in the therapy session.   Patient Goal: Return home    Pain Assessment  denies    Objective:  Observation of patient/vitals  Cognition:  A&Ox3  Inspection/Posture: [facility]    Musculoskeletal Examination  Gross ROM: Within functional limits B LE  Gross Strength: Within functional limits B LE    Sensation: Grossly Intact    Functional Mobility  Transfers: independent  Ambulation: 300' independent without AD  Stairs: 4 independent w/ R Rail    Balance: within functional limits    Participation and Endurance   Participation Effort: excellent   Endurance: Endurance does not limit participation in activity      G codes  Based on AM-PAC Current:         Goal:         Discharge:    Time Calculation  PT Received On: 11/07/14  Start Time: 0945  Stop Time: 1005  Time Calculation (min): 20 min    Signature: Jacqulyn Cane, PT

## 2014-11-07 NOTE — ED Notes (Addendum)
Pt with numbness to left arm and left hand, which woke him from sleep. Went to sleep at 2300 yesterday and woke at 0230 with s/s. No HA. No weakness. No vision change. No slurred speech or facial droop. Took three regular asa prior to arrival.

## 2014-11-07 NOTE — Progress Notes (Signed)
Patient picked up by transport via [facility] for MRI and possibly [place] carotid. Patient travelled off tele per MD order.

## 2014-11-07 NOTE — Progress Notes (Addendum)
[facility]  HOSPITALIST  PROGRESS NOTE      Patient: Matthew Dudley [facility]  Date: 11/07/2014   LOS:  Days  Admission Date: 11/07/2014   MRN: [id number]  Attending: Brand Males MD     ASSESSMENT/PLAN     Matthew Dudley is a 59 y.o. male admitted with left arm numbness      1. Left arm numbess - likely TIA  - CT head negative for acute CVA  - MRI head negative for without acute intracranial abnormality  - pending carotid doppler, TTE  - Dr. Adline Mango consulted  - lipid panel with LDL 84 and low HDL 34 - cont statin  - cont asa  - TSH normal  - HbA1C  5.5    2. DVT prophylaxis  - lovenox     Code Status: full code    DISPO: home with family      Care Plan discussed with nursing, consultants       SUBJECTIVE     Matthew Dudley [facility] states his left arm numbness has resolved. No other complaints. Has not seen PCP in years.     MEDICATIONS     Current Facility-Administered Medications   Medication Dose Route Frequency   . [START ON 11/08/2014] aspirin  81 mg Oral Daily   . atorvastatin  40 mg Oral QPM   . enoxaparin  40 mg Subcutaneous QAM       ROS     Remainder of 10 point ROS as above or otherwise negative    PHYSICAL EXAM     Filed Vitals:    11/07/14 1526   BP: 142/98   Pulse: 67   Temp: 97.3 F (36.3 C)   Resp: 17   SpO2: 97%       Temperature: Temp  Min: 97.3 F (36.3 C)  Max: 98.2 F (36.8 C)  Pulse: Pulse  Min: 63  Max: 78  Respiratory: Resp  Min: 17  Max: 18  Non-Invasive BP: BP  Min: 141/78  Max: 164/101  Pulse Oximetry SpO2  Min: 96 %  Max: 98 %    Intake and Output Summary (Last 24 hours) at Date Time    Intake/Output Summary (Last 24 hours) at 11/07/14 1705  Last data filed at 11/07/14 1256   Gross per 24 hour   Intake    480 ml   Output      0 ml   Net    480 ml       GEN APPEARANCE: Normal;  A&OX3  HEENT: PERLA; EOMI; Conjunctiva Clear  NECK: Supple; No bruits  CVS: RRR, S1, S2; No M/G/R  LUNGS: CTAB; No Wheezes; No Rhonchi: No rales  ABD: Soft; No TTP; + Normoactive BS  EXT: No edema; Pulses 2+ and  intact  SKIN: No rash or Lesions  NEURO: CN 2-12 intact; No Focal neurological deficits      LABS       Recent Labs  Lab 11/07/14  0340   WBC 5.18   RBC 4.75   HGB 14.9   HEMATOCRIT 41.8*   MCV 88.0   PLATELETS 211         Recent Labs  Lab 11/07/14  0340   SODIUM 143   POTASSIUM 3.9   CHLORIDE 111   CO2 24   BUN 21   CREATININE 1.0   GLUCOSE 105*   CALCIUM 9.3   MAGNESIUM 2.2  Recent Labs  Lab 11/07/14  0340   TROPONIN I 0.02         Recent Labs  Lab 11/07/14  0340   PT INR 0.8*   PT 11.7*   PTT 25       Microbiology Results     None           RADIOLOGY     Radiological Procedure personally reviewed and concur with radiologist reports unless stated otherwise.    MRI brain without contrast   Final Result    No acute intracranial abnormality. Mild nonspecific white   matter changes which may reflect chronic small vessel ischemic change in   a patient of this age.      Georgann Housekeeper, MD    11/07/2014 1:35 PM         Chest AP Portable   Final Result    Portable AP radiographic examination of the chest shows no   acute abnormality. The heart appears top normal in size.     There is no   evidence of CHF or pneumonia.                   Miguel Dibble, MD    11/07/2014 4:11 AM         CT Head WO Contrast   Final Result    CT scan of the head shows no hydrocephalus, herniation, or   intracranial hemorrhage. There is no visible acute intracranial   abnormality.       An acute stroke, if present, is not visible on this   examination.  Correlate clinically to determine whether further imaging   with MRI is indicated.                         Miguel Dibble, MD    11/07/2014 4:00 AM         US CAROTID DUPLEX DOPP COMP    (Results Pending)       Signed,  Brand Males, MD  5:05 PM 11/07/2014

## 2014-11-07 NOTE — OT Eval Note (Signed)
[facility]   Occupational Therapy Evaluation     Patient: Matthew Dudley [facility]      MRN#: [id number]   Unit: T4C TELEMETRY  Bed: Q469/G295-28    Time Calculation  OT Received On: 11/07/14  Start Time: 1422  Stop Time: 1434  Time Calculation (min): 12 min    Consult received for Matthew Dudley for OT Evaluation and Treatment.  Patient's medical condition is appropriate for Occupational therapy intervention at this time.    Medicare ID# - Patient is not covered by Medicare.    Assessment:   Assessment: Appears to be at baseline for ADL's;Appears to be at baseline for mobility.   Prognosis: Good   Pt with very mild impairment of FMC and proprioception of LUE; WFL. HEP provided for functional [facility] exercises. D/C OT.     Interdisciplinary Communication   Patient is in bed and call bell set within reach. Communicated with RN regarding readiness for session.    Plan:   Treatment Interventions: No skilled interventions needed at this time     Discharge Recommendation: Home with no needs;Other (Comment) (HEP for [facility] provided to pt)     OT Frequency Recommended: one time visit   If coordination worsens or does not improve pt educated on importance of discussing with MD to obtain outpatient OT order.      Education:   Educated patient to role of occupational therapy, plan of care, home safety, next appropriate level of care, HEP. Demonstrated good understanding. Also discussed goals of therapy and are in agreement with plan.      Evaluation:   Precautions and Contraindications:  Precautions  Weight Bearing Status: no restrictions    Medical Diagnosis: Transient cerebral ischemia, unspecified type [G45.9]      History of Present Illness: Matthew Dudley is a 59 y.o. male admitted on 11/07/2014 with LUE numbness    Patient Active Problem List   Diagnosis   . TIA (transient ischemic attack)      Social History:  Prior Level of Function  Prior level of function: Independent with ADLs;Ambulates  independently  Baseline Activity Level: Community ambulation  Driving: independent  Dressing - Upper Body: independent  Dressing - Lower Body: independent  Cooking: Yes  Feeding: independent  Bathing: independent  Grooming: independent  Toileting: independent  Employment: FT  Home Living Arrangements  Living Arrangements: Spouse/significant other  Type of Home: House  Home Layout: Multi-level  Home Living - Notes / Comments: home with spouse, pt is from out of town and here for work    Subjective:   Patient is agreeable to participation in the therapy session. Nursing clears patient for therapy.      Pain Assessment  Pain Assessment: No/denies pain    Objective:  Inspection/Posture: [facility]    Patient is in bed with telemetry in place.    Cognitive Status and Neuro Exam:  Cognition/Neuro Status  Arousal/Alertness: Appropriate responses to stimuli  Attention Span: Appears intact  Orientation Level: Oriented X4  Memory: Appears intact  Following Commands: independent  Safety Awareness: independent  Insights: Fully aware of deficits  Problem Solving: Able to problem solve independently  Behavior: calm;cooperative  Neuro Status  Behavior: calm;cooperative        Sensory  Auditory: intact  Tactile - Light Touch: intact  Visual Acuity: wears glasses  Vision - Complex Assessment  Ocular Range of Motion: Within Functional Limits  Head Position: Within Defined Limits  Tracking: Able to track stimulus in all  quads without difficulty  Visual Fields:  [facility])  Acuity: Able to read clock/calendar on wall without difficulty;Able to read employee name badge without difficulty;Able to read normal print without difficulty (wearing reading glasses)    Musculoskeletal Examination  Gross ROM  Right Upper Extremity ROM: within functional limits  Left Upper Extremity ROM: within functional limits  Gross Strength  Right Upper Extremity Strength: 5/5  Left Upper Extremity Strength: 4+/5    Sensory/Oculomotor Examination  Sensory  Auditory:  intact  Tactile - Light Touch: intact  Visual Acuity: wears glasses  Vision - Complex Assessment  Ocular Range of Motion: Within Functional Limits  Head Position: Within Defined Limits  Tracking: Able to track stimulus in all quads without difficulty  Visual Fields:  [facility])  Acuity: Able to read clock/calendar on wall without difficulty;Able to read employee name badge without difficulty;Able to read normal print without difficulty (wearing reading glasses)    Activities of Daily Living  Self-care and Home Management  Eating: Independent  Grooming: Independent  Bathing: Independent  UB Dressing: Independent  LB Dressing: Independent  Toileting: Independent  Functional Transfers: Independent    Functional Mobility:  Mobility and Transfers  Supine to Sit: Independent  Sit to Stand: Independent  Functional Mobility/Ambulation: Independent     Balance  Balance  Static Sitting Balance: Independent  Dyanamic Sitting Balance: Independent  Static Standing Balance: Independent  Dynamic Standing Balance: Independent    Goals:   Patient Goal: Return home at PLOF    Signature: Synetta Fail, OT 11/07/2014

## 2014-11-07 NOTE — Progress Notes (Signed)
Patient picked up by transport via [place] for [place] carotid. Patient travelled off Tele.

## 2014-11-08 ENCOUNTER — Observation Stay: Payer: Commercial Managed Care - POS

## 2014-11-08 ENCOUNTER — Other Ambulatory Visit: Payer: Commercial Managed Care - POS

## 2014-11-08 ENCOUNTER — Other Ambulatory Visit: Payer: Self-pay

## 2014-11-08 DIAGNOSIS — G459 Transient cerebral ischemic attack, unspecified: Secondary | ICD-10-CM | POA: Insufficient documentation

## 2014-11-08 LAB — CBC
Hematocrit: 40.3 % — ABNORMAL LOW (ref 42.0–52.0)
Hgb: 14.2 g/dL (ref 13.0–17.0)
MCH: 31 pg (ref 28.0–32.0)
MCHC: 35.2 g/dL (ref 32.0–36.0)
MCV: 88 fL (ref 80.0–100.0)
MPV: 10.3 fL (ref 9.4–12.3)
Nucleated RBC: 0 /100 WBC (ref 0–1)
Platelets: 221 10*3/uL (ref 140–400)
RBC: 4.58 10*6/uL — ABNORMAL LOW (ref 4.70–6.00)
RDW: 13 % (ref 12–15)
WBC: 5.76 10*3/uL (ref 3.50–10.80)

## 2014-11-08 LAB — BASIC METABOLIC PANEL
Anion Gap: 6 (ref 5.0–15.0)
BUN: 15 mg/dL (ref 9–28)
CO2: 25 mEq/L (ref 22–29)
Calcium: 9.2 mg/dL (ref 8.5–10.5)
Chloride: 109 mEq/L (ref 100–111)
Creatinine: 1 mg/dL (ref 0.7–1.3)
Glucose: 97 mg/dL (ref 70–100)
Potassium: 4.3 mEq/L (ref 3.5–5.1)
Sodium: 140 mEq/L (ref 136–145)

## 2014-11-08 LAB — GFR: EGFR: >60

## 2014-11-08 MED ORDER — ACETAMINOPHEN 325 MG PO TABS
650.0000 mg | ORAL_TABLET | ORAL | Status: DC | PRN
Start: 2014-11-08 — End: 2014-11-09
  Administered 2014-11-08 – 2014-11-09 (×2): 650 mg via ORAL
  Filled 2014-11-08 (×2): qty 2

## 2014-11-08 MED ORDER — ASPIRIN 81 MG PO CHEW
81.0000 mg | CHEWABLE_TABLET | Freq: Every day | ORAL | 1 refills | Status: AC
Start: 2014-11-08 — End: ?
  Filled 2014-11-08: qty 30, 30d supply, fill #0

## 2014-11-08 MED ORDER — ATORVASTATIN CALCIUM 40 MG PO TABS
40.0000 mg | ORAL_TABLET | Freq: Every evening | ORAL | 1 refills | Status: AC
Start: 2014-11-08 — End: ?
  Filled 2014-11-08: qty 30, 30d supply, fill #0

## 2014-11-08 MED ORDER — AMLODIPINE BESYLATE 5 MG PO TABS
5.0000 mg | ORAL_TABLET | Freq: Every day | ORAL | Status: DC
Start: 2014-11-08 — End: 2014-11-09
  Administered 2014-11-08 – 2014-11-09 (×2): 5 mg via ORAL
  Filled 2014-11-08 (×3): qty 1

## 2014-11-08 MED ORDER — AMLODIPINE BESYLATE 5 MG PO TABS
5.0000 mg | ORAL_TABLET | Freq: Every day | ORAL | 1 refills | Status: AC
Start: 2014-11-08 — End: ?
  Filled 2014-11-08: qty 30, 30d supply, fill #0

## 2014-11-08 NOTE — Discharge Instructions (Signed)
Please make an appointment with your primary care doctor within 1 week of discharge so they can monitor your blood pressure and note your new medications you have been started on. Be sure to follow up with your PCP routinely.     What isa TIA?  A TIA (Transient Ischemic Attack) is an early warning that a stroke (also called a brain attack) is coming. A TIA is a temporary stroke. It causes no lasting damage. But the effects of a stroke, if it happens, can be very serious and lasting. If you think you are having symptoms of a TIA or stroke--even if they don't last--get medical help right away.  Symptoms of TIA and stroke  Symptoms may come on suddenly and last for a few seconds or a few hours. You may have symptoms only once. Or they may come and go for days. If you notice any of the following symptoms, don't wait. Call 911 or emergency services right away.   Weakness, numbness, tingling, or loss of feeling in your face, arm, or leg.   Trouble seeing in one or both eyes; double vision.   Slurred speech, trouble talking, or problems understanding others when they speak   Sudden, severe headache   Dizziness or a feeling of spinning   Loss of balance or falling   Blackouts   2000-2015 [employer] [address], [place], [place] [address]. All rights reserved. This information is not intended as a substitute for professional medical care. Always follow your healthcare professional's instructions.      Atorvastatin Calcium Oral tablet  What is this medicine?  ATORVASTATIN (a TORE [place] sta tin) is known as a HMG-CoA reductase inhibitor or 'statin'. It lowers the level of cholesterol and triglycerides in the blood. This drug may also reduce the risk of heart attack, stroke, or other health problems in patients with risk factors for heart disease. Diet and lifestyle changes are often used with this drug.  This medicine may be used for other purposes; ask your health care provider or pharmacist if you  have questions.  What should I tell my health care provider before I take this medicine?  They need to know if you have any of these conditions:   frequently drink alcoholic beverages   history of stroke, TIA   kidney disease   liver disease   muscle aches or weakness   other medical condition   an unusual or allergic reaction to atorvastatin, other medicines, foods, dyes, or preservatives   pregnant or trying to get pregnant   breast-feeding  How should I use this medicine?  Take this medicine by mouth with a glass of water. Follow the directions on the prescription label. You can take this medicine with or without food. Take your doses at regular intervals. Do not take your medicine more often than directed.  Talk to your pediatrician regarding the use of this medicine in children. While this drug may be prescribed for children as young as 42 years old for selected conditions, precautions do apply.  Overdosage: If you think you have taken too much of this medicine contact a poison control center or emergency room at once.  NOTE: This medicine is only for you. Do not share this medicine with others.  What if I miss a dose?  If you miss a dose, take it as soon as you can. If it is almost time for your next dose, take only that dose. Do not take double or extra  doses.  What may interact with this medicine?  Do not take this medicine with any of the following medications:   red yeast rice   telaprevir   telithromycin   voriconazole  This medicine may also interact with the following medications:   alcohol   antiviral medicines for HIV or AIDS   boceprevir   certain antibiotics like clarithromycin, erythromycin, troleandomycin   certain medicines for cholesterol like fenofibrate or gemfibrozil   cimetidine   clarithromycin   colchicine   cyclosporine   digoxin   male hormones, like estrogens or progestins and birth control pills   grapefruit juice   medicines for fungal infections like  fluconazole, itraconazole, ketoconazole   niacin   rifampin   spironolactone  This list may not describe all possible interactions. Give your health care provider a list of all the medicines, herbs, non-prescription drugs, or dietary supplements you use. Also tell them if you smoke, drink alcohol, or use illegal drugs. Some items may interact with your medicine.  What should I watch for while using this medicine?  Visit your doctor or health care professional for regular check-ups. You may need regular tests to make sure your liver is working properly.  Tell your doctor or health care professional right away if you get any unexplained muscle pain, tenderness, or weakness, especially if you also have a fever and tiredness. Your doctor or health care professional may tell you to stop taking this medicine if you develop muscle problems. If your muscle problems do not go away after stopping this medicine, contact your health care professional.  This drug is only part of a total heart-health program. Your doctor or a dietician can suggest a low-cholesterol and low-fat diet to help. Avoid alcohol and smoking, and keep a proper exercise schedule.  Do not use this drug if you are pregnant or breast-feeding. Serious side effects to an unborn child or to an infant are possible. Talk to your doctor or pharmacist for more information.  This medicine may affect blood sugar levels. If you have diabetes, check with your doctor or health care professional before you change your diet or the dose of your diabetic medicine.  If you are going to have surgery tell your health care professional that you are taking this drug.  What side effects may I notice from receiving this medicine?  Side effects that you should report to your doctor or health care professional as soon as possible:   allergic reactions like skin rash, itching or hives, swelling of the face, lips, or tongue   dark urine   fever   joint pain   muscle cramps,  pain   redness, blistering, peeling or loosening of the skin, including inside the mouth   trouble passing urine or change in the amount of urine   unusually weak or tired   yellowing of eyes or skin  Side effects that usually do not require medical attention (report to your doctor or health care professional if they continue or are bothersome):   constipation   heartburn   stomach gas, pain, upset  This list may not describe all possible side effects. Call your doctor for medical advice about side effects. You may report side effects to FDA at 1-800-FDA-1088.  Where should I keep my medicine?  Keep out of the reach of children.  Store at room temperature between 20 to 25 degrees C (68 to 77 degrees F). Throw away any unused medicine after the expiration date.  NOTE:This sheet is a summary. It may not cover all possible information. If you have questions about this medicine, talk to your doctor, pharmacist, or health care provider. Copyright 2015 [employer]          Aspirin Oral tablet  What is this medicine?  ASPIRIN (AS pir in) is a pain reliever. It is used to treat mild pain and fever. This medicine is also used as directed by a doctor to prevent and to treat heart attacks, to prevent strokes, and to treat arthritis or inflammation.  This medicine may be used for other purposes; ask your health care provider or pharmacist if you have questions.  What should I tell my health care provider before I take this medicine?  They need to know if you have any of these conditions:   anemia   asthma   bleeding problems   child with chickenpox, the flu, or other viral infection   diabetes   gout   if you frequently drink alcohol containing drinks   kidney disease   liver disease   low level of vitamin K   lupus   smoke tobacco   stomach ulcers or other problems   an unusual or allergic reaction to aspirin, tartrazine dye, other medicines, dyes, or preservatives   pregnant or trying to get  pregnant   breast-feeding  How should I use this medicine?  Take this medicine by mouth with a glass of water. Follow the directions on the package or prescription label. You can take this medicine with or without food. If it upsets your stomach, take it with food. Do not take your medicine more often than directed.  Talk to your pediatrician regarding the use of this medicine in children. While this drug may be prescribed for children as young as 38 years of age for selected conditions, precautions do apply. Children and teenagers should not use this medicine to treat chicken pox or flu symptoms unless directed by a doctor.  Patients over 20 years old may have a stronger reaction and need a smaller dose.  Overdosage: If you think you have taken too much of this medicine contact a poison control center or emergency room at once.  NOTE: This medicine is only for you. Do not share this medicine with others.  What if I miss a dose?  If you are taking this medicine on a regular schedule and miss a dose, take it as soon as you can. If it is almost time for your next dose, take only that dose. Do not take double or extra doses.  What may interact with this medicine?  Do not take this medicine with any of the following medications:   cidofovir   ketorolac   probenecid  This medicine may also interact with the following medications:   alcohol   alendronate   bismuth subsalicylate   flavocoxid   herbal supplements like feverfew, garlic, ginger, ginkgo biloba, horse chestnut   medicines for diabetes or glaucoma like acetazolamide, methazolamide   medicines for gout   medicines that treat or prevent blood clots like enoxaparin, heparin, ticlopidine, warfarin   other aspirin and aspirin-like medicines   NSAIDs, medicines for pain and inflammation, like ibuprofen or naproxen   pemetrexed   sulfinpyrazone   varicella live vaccine  This list may not describe all possible interactions. Give your health care  provider a list of all the medicines, herbs, non-prescription drugs, or dietary supplements you use. Also tell them if you smoke,  drink alcohol, or use illegal drugs. Some items may interact with your medicine.  What should I watch for while using this medicine?  If you are treating yourself for pain, tell your doctor or health care professional if the pain lasts more than 10 days, if it gets worse, or if there is a new or different kind of pain. Tell your doctor if you see redness or swelling. Also, check with your doctor if you have a fever that lasts for more than 3 days. Only take this medicine to prevent heart attacks or blood clotting if prescribed by your doctor or health care professional.  Do not take aspirin or aspirin-like medicines with this medicine. Too much aspirin can be dangerous. Always read the labels carefully.  This medicine can irritate your stomach or cause bleeding problems. Do not smoke cigarettes or drink alcohol while taking this medicine. Do not lie down for 30 minutes after taking this medicine to prevent irritation to your throat.  If you are scheduled for any medical or dental procedure, tell your healthcare provider that you are taking this medicine. You may need to stop taking this medicine before the procedure.  This medicine may be used to treat migraines. If you take migraine medicines for 10 or more days a month, your migraines may get worse. Keep a diary of headache days and medicine use. Contact your healthcare professional if your migraine attacks occur more frequently.  What side effects may I notice from receiving this medicine?  Side effects that you should report to your doctor or health care professional as soon as possible:   allergic reactions like skin rash, itching or hives, swelling of the face, lips, or tongue   breathing problems   changes in hearing, ringing in the ears   confusion   general ill feeling or flu-like symptoms   pain on swallowing   redness,  blistering, peeling or loosening of the skin, including inside the mouth or nose   signs and symptoms of bleeding such as bloody or black, tarry stools; red or dark-brown urine; spitting up blood or brown material that looks like coffee grounds; red spots on the skin; unusual bruising or bleeding from the eye, gums, or nose   trouble passing urine or change in the amount of urine   unusually weak or tired   yellowing of the eyes or skin  Side effects that usually do not require medical attention (report to your doctor or health care professional if they continue or are bothersome):   diarrhea or constipation   headache   nausea, vomiting   stomach gas, heartburn  This list may not describe all possible side effects. Call your doctor for medical advice about side effects. You may report side effects to FDA at 1-800-FDA-1088.  Where should I keep my medicine?  Keep out of the reach of children.  Store at room temperature between 15 and 30 degrees C (59 and 86 degrees F). Protect from heat and moisture. Do not use this medicine if it has a strong vinegar smell. Throw away any unused medicine after the expiration date.  NOTE:This sheet is a summary. It may not cover all possible information. If you have questions about this medicine, talk to your doctor, pharmacist, or health care provider. Copyright 2015 [employer] Standard        Amlodipine Besylate Oral tablet  What is this medicine?  AMLODIPINE (am LOE di peen) is a calcium-channel blocker. It affects the amount of calcium found  in your heart and muscle cells. This relaxes your blood vessels, which can reduce the amount of work the heart has to do. This medicine is used to lower high blood pressure. It is also used to prevent chest pain.  This medicine may be used for other purposes; ask your health care provider or pharmacist if you have questions.  What should I tell my health care provider before I take this medicine?  They need to know if you have any of  these conditions:   heart problems like heart failure or aortic stenosis   liver disease   an unusual or allergic reaction to amlodipine, other medicines, foods, dyes, or preservatives   pregnant or trying to get pregnant   breast-feeding  How should I use this medicine?  Take this medicine by mouth with a glass of water. Follow the directions on the prescription label. Take your medicine at regular intervals. Do not take more medicine than directed.  Talk to your pediatrician regarding the use of this medicine in children. Special care may be needed. This medicine has been used in children as young as 6.  Persons over 21 years old may have a stronger reaction to this medicine and need smaller doses.  Overdosage: If you think you have taken too much of this medicine contact a poison control center or emergency room at once.  NOTE: This medicine is only for you. Do not share this medicine with others.  What if I miss a dose?  If you miss a dose, take it as soon as you can. If it is almost time for your next dose, take only that dose. Do not take double or extra doses.  What may interact with this medicine?   herbal or dietary supplements   local or general anesthetics   medicines for high blood pressure   medicines for prostate problems   rifampin  This list may not describe all possible interactions. Give your health care provider a list of all the medicines, herbs, non-prescription drugs, or dietary supplements you use. Also tell them if you smoke, drink alcohol, or use illegal drugs. Some items may interact with your medicine.  What should I watch for while using this medicine?  Visit your doctor or health care professional for regular check ups. Check your blood pressure and pulse rate regularly. Ask your health care professional what your blood pressure and pulse rate should be, and when you should contact him or her.  This medicine may make you feel confused, dizzy or lightheaded. Do not drive, use  machinery, or do anything that needs mental alertness until you know how this medicine affects you. To reduce the risk of dizzy or fainting spells, do not sit or stand up quickly, especially if you are an older patient. Avoid alcoholic drinks; they can make you more dizzy.  Do not suddenly stop taking amlodipine. Ask your doctor or health care professional how you can gradually reduce the dose.  What side effects may I notice from receiving this medicine?  Side effects that you should report to your doctor or health care professional as soon as possible:   allergic reactions like skin rash, itching or hives, swelling of the face, lips, or tongue   breathing problems   changes in vision or hearing   chest pain   fast, irregular heartbeat   swelling of legs or ankles  Side effects that usually do not require medical attention (report to your doctor or health care professional  if they continue or are bothersome):   dry mouth   facial flushing   nausea, vomiting   stomach gas, pain   tired, weak   trouble sleeping  This list may not describe all possible side effects. Call your doctor for medical advice about side effects. You may report side effects to FDA at 1-800-FDA-1088.  Where should I keep my medicine?  Keep out of the reach of children.  Store at room temperature between 59 and 86 degrees F (15 and 30 degrees C). Protect from light. Keep container tightly closed. Throw away any unused medicine after the expiration date.  NOTE:This sheet is a summary. It may not cover all possible information. If you have questions about this medicine, talk to your doctor, pharmacist, or health care provider. Copyright 2015 Gold Standard

## 2014-11-08 NOTE — Progress Notes (Signed)
NEUROLOGY PROGRESS NOTE    Date Time: 11/08/2014 2:18 PM  Patient Name: Matthew Dudley  Attending Physician: Shawnee Knapp, MD    BRIEF HISTORY:   REASON FOR CONSULTATION:  Transient left arm numbness and weakness.    HISTORY OF PRESENT ILLNESS:  The patient is a 59 year old overall healthy man who comes into the  hospital with above complaints. The patient woke up at 2:30 a.m., his left  arm was numb. He also could not use his left hand properly to tie buttons  or shoelaces. The patient came to the ER within an hour. Due to low NIH  stroke scale, improving symptoms, he is not felt to be a candidate for  thrombolytic therapy. The patient afebrile, hemodynamically stable, blood  pressure elevated at 164/101. CT brain scan is negative. The patient is  admitted to the hospital for further evaluation. His symptoms lasted about  3 hours, completely resolved. He denies any headache, dizziness, facial  numbness, slurred speech, facial droop, no chest pain, palpitation, cough,  hemoptysis, abdominal pain, diarrhea, dysuria, hematuria. No head or neck  trauma, no neck pain, no problem with left leg such as numbness, tingling,  weakness.      Chief Complaints:   Left arm OK    Interval History/24 hour events:   Awaiting TEE based on 2 D Echo results      Review Of Systems:  No headaches, dizziness, confusion, focal weakness or numbness  No deafness, tinnitus, blurred vision, or vision loss, No double vision or droopy lids  Sleep good, no anxiety or depression, memory preserved  No speech or swallowing problems  Balance intact, No Falls  No back or neck pain  No bowel or bladder incontinence  No fever or chills        Physical Exam:     Filed Vitals:    11/08/14 1204   BP: 158/87   Pulse: 66   Temp: 96.4 F (35.8 C)   Resp: 16   SpO2: 96%       Intake and Output Summary (Last 24 hours) at Date Time    Intake/Output Summary (Last 24 hours) at 11/08/14 1418  Last data filed at 11/08/14 0918   Gross per  24 hour   Intake    420 ml   Output      0 ml   Net    420 ml       General: No apparent distress, comfortable appearing, vital signs noted above  Head: Atraumatic and normocephalic  Eyes: Anicteric sclerae, moist conjunctiva, PERRLA   ENT: Oropharynx clear; no erythema or exudates;   Neck: supple; FROM; no mass   Extremities: No clubbing, cyanosis or edema  CV: Regular rate and rhythm, no murmurs  Lungs: clear  Skin: No rash, lesions, ulcers   Neurological Examination  Mental Status:   Alert, oriented, to time, place, person and situation   Clear speech, normal language, including fluency, naming,     Normal short term and remote memory.  Adequate knowledge.   Affect appropriate   CN 2-12    EOM full, PERL, Normal fields and fundi, Face symmetric, tongue midline with normal bulk and no fasicualtions  Hearing, facial sensations and Gag reflex intact   Motor system: Normal tone, bulk, no involuntary movements.     Power 5/5 and  Symmetric in arms and legs.  No cog wheeling, tremors, or bradykinesia.   Sensory system: Normal touch, pin prick vibration in all 4 extremities; no sensory level  Reflexes:  Deep tendon reflexes symmetric, Negative Babinski  Cerebellar:  Normal   Gait: Normal  Spine: Non tender full Range of motion  Neurovascular: No bruits.  Signs of meningeal irritation: None        Other pertinent positive findings:       Meds:     Current Facility-Administered Medications   Medication Dose Route Frequency   . amLODIPine  5 mg Oral Daily   . aspirin  81 mg Oral Daily   . atorvastatin  40 mg Oral QPM   . enoxaparin  40 mg Subcutaneous QAM       Labs:     Results     Procedure Component Value Units Date/Time    Basic Metabolic Panel [[id number]] Collected:  11/08/14 0517    Specimen Information:  Blood Updated:  11/08/14 0632     Glucose 97 mg/dL      BUN 15 mg/dL      Creatinine 1.0 mg/dL      CALCIUM 9.2 mg/dL      Sodium 409 mEq/L      Potassium 4.3 mEq/L      Chloride 109 mEq/L      CO2 25 mEq/L       Anion Gap 6.0     GFR [[id number]] Collected:  11/08/14 0517     EGFR >60.0 Updated:  11/08/14 0632    CBC [[id number]]  (Abnormal) Collected:  11/08/14 0517    Specimen Information:  Blood / Blood Updated:  11/08/14 0604     WBC 5.76 x10 3/uL      Hgb 14.2 g/dL      Hematocrit 57.8 (L) %      Platelets 221 x10 3/uL      RBC 4.58 (L) x10 6/uL      MCV 88.0 fL      MCH 31.0 pg      MCHC 35.2 g/dL      RDW 13 %      MPV 10.3 fL      Nucleated RBC 0 /100 WBC     TSH [[id number]] Collected:  11/07/14 0340     Thyroid Stimulating Hormone 2.91 uIU/mL Updated:  11/07/14 1614            Imaging personally reviewed, including:     Radiology Results (24 Hour)     Procedure Component Value Units Date/Time    US CAROTID DUPLEX DOPP COMP [[id number]] Collected:  11/07/14 1739    Order Status:  Completed Updated:  11/07/14 1744    Narrative:      HISTORY: TIA    DUPLEX CAROTID DOPPLER:  The extracranial carotid arteries and proximal vertebral arteries were  evaluated with high resolution imaging, color Doppler, and spectral  Doppler techniques.      There are minimal carotid plaque seen bilaterally.  Flow velocities are  not elevated.  Low resistance flow pattern is seen in the common carotid  and internal carotid arteries bilaterally.  Vertebral arteries show  antegrade flow bilaterally.  Following maximal velocities were measured:    MEASURED VELOCITIES:    Right Distal CCA                    54cm/sec      Right Distal ICA (peak systolic)    70cm/sec  Right Distal ICA (end diastolic)     32cm/sec             Left Distal CCA  61cm/sec                  Left Distal ICA (peak systolic)     94cm/sec  Left Distal ICA (end diastolic)      45cm/sec           Impression:          No hemodynamically significant stenosis seen in bilateral carotid  arteries.    Kristine Linea, MD   11/07/2014 5:40 PM              Assessment:     Patient Active Problem List   Diagnosis   . TIA (transient ischemic attack)   . Transient  cerebral ischemia, unspecified type     Neurologically stable    Plan/Recommendations:   General measures:  Diet/activity/Precuations: Per routine from admitting orders  Symptomatic measures: as needed  Diagnostics:MRI/Carotid doppler/Echo  Reviewed, TEE planned  Medications:Noted  Consultation:Noted  Physical/Occupational/Speech Therapy:As needed  Patient Education/Counseling:Provided        Anticipated discharge disposition and date: Unknown    Case discussed with: Nursing/Attending        Signed by: Shawnee Knapp, MD

## 2014-11-08 NOTE — Plan of Care (Signed)
Problem: Day 2- Stroke  Goal: Stable vital signs and fluid balance  Outcome: Progressing  Patient Admitted For:TIA  Current Assessed Risk Factors:              Non-modifiable (age, gender, race, family history, previous stroke or TIA): age, gender              Modifiable (smoking, obesity, alcohol, illegal drug use): na              Modifiable with Doctor's help (hypertension, DM,carotid, A-fib, high chol):  High chol, HTN               Doctor prescribed: Norvasc, Lipitor  Patient/Family Stroke Prevention Goals: (list stated lifestyle changes/goals) taking medication as prescribed  Education Materials Given To: Pt  Response To Teaching As Evidenced By: (summarize patient/family response) verbalized understanding  Goals For Today (imaging, lab tests, q 4hr neuro checks, education): q4 neuro check, education, ECHO

## 2014-11-08 NOTE — Plan of Care (Signed)
Problem: Day 2- Stroke  Goal: Patient/Patient Care Companion demonstrates understanding on disease process, treatment plan, medications,discharge plan, and consequences of noncompliance  Outcome: Progressing  Patient Admitted For:TIA  Current Assessed Risk Factors:              Non-modifiable (age, gender, race, family history, previous stroke or TIA): age, gender              Modifiable (smoking, obesity, alcohol, illegal drug use): na              Modifiable with Doctor's help (hypertension, DM,carotid, A-fib, high chol):  High chol, HTN                Doctor prescribed: Norvasc, Lipitor  Patient/Family Stroke Prevention Goals: (list stated lifestyle changes/goals) taking medication as prescribed  Education Materials Given To: Pt  Response To Teaching As Evidenced By: (summarize patient/family response) verbalized understanding  Goals For Today (imaging, lab tests, q 4hr neuro checks, education): q4 neuro check, education, ECHO

## 2014-11-08 NOTE — Progress Notes (Addendum)
[facility] - Inpatient  HOSPITALIST  PROGRESS NOTE      Patient: Matthew Dudley  Date: 11/08/2014   LOS:  Days  Admission Date: 11/07/2014   MRN: [id number]  Attending: Brand Males MD     ASSESSMENT/PLAN     Matthew Dudley is a 59 y.o. male admitted with left arm numbness      1. Left arm numbess - likely TIA  - CT head negative for acute CVA  - MRI head negative for without acute intracranial abnormality  - Carotid doppler without significant stenosis  - Dr. Adline Mango consulted, appreciate input  - lipid panel with LDL 84 and low HDL 34 - cont statin  - cont asa  - TSH normal  - HbA1C  5.5    2. 4mm echodensity on the right coronary cusp of aortic valve on TTE  - read as a focal calcification vs vegetation  - Pt is asymptomatic with no fevers or leukocytosis so suspicion for endocarditis is low although should be ruled out before discharge.Holding off on abx for now. Will obtain TEE tomorrow for definite answer - spoke with Dr. Marinell Blight  - check blood cx  - NPO after midnight    3. HTN - no previous dx of HTN (although pt admits to not seeing his PCP in years)  - SBP during this admission mostly 140-150's  - start norvasc 5mg  daily  - educated about low salt diet, exercise    4. DVT prophylaxis  - lovenox    Dispo: if TEE results tomorrow confirm calcification rather than vegetation pt can be discharged     Code Status: full code    DISPO: home with family      Care Plan discussed with nursing, consultants       SUBJECTIVE     Matthew Dudley feels well - anxious to go home although understands TTE findings and need for further evaluation to r/o vegetation. No other complaints. No numbness in left arm.    MEDICATIONS     Current Facility-Administered Medications   Medication Dose Route Frequency   . amLODIPine  5 mg Oral Daily   . aspirin  81 mg Oral Daily   . atorvastatin  40 mg Oral QPM   . enoxaparin  40 mg Subcutaneous QAM       ROS     Remainder of 10 point ROS as above or otherwise  negative    PHYSICAL EXAM     Filed Vitals:    11/08/14 1548   BP: 143/83   Pulse: 84   Temp: 97.2 F (36.2 C)   Resp: 16   SpO2: 95%       Temperature: Temp  Min: 96.4 F (35.8 C)  Max: 97.5 F (36.4 C)  Pulse: Pulse  Min: 63  Max: 84  Respiratory: Resp  Min: 13  Max: 19  Non-Invasive BP: BP  Min: 138/93  Max: 159/94  Pulse Oximetry SpO2  Min: 95 %  Max: 97 %    Intake and Output Summary (Last 24 hours) at Date Time    Intake/Output Summary (Last 24 hours) at 11/08/14 1612  Last data filed at 11/08/14 0918   Gross per 24 hour   Intake    420 ml   Output      0 ml   Net    420 ml       GEN APPEARANCE: Normal;  A&OX3  HEENT: PERLA; EOMI; Conjunctiva Clear  NECK: Supple; No bruits  CVS: RRR, S1, S2; No M/G/R  LUNGS: CTAB; No Wheezes; No Rhonchi: No rales  ABD: Soft; No TTP; + Normoactive BS  EXT: No edema; Pulses 2+ and intact  SKIN: No rash or Lesions  NEURO: CN 2-12 intact; No Focal neurological deficits      LABS       Recent Labs  Lab 11/08/14  0517 11/07/14  0340   WBC 5.76 5.18   RBC 4.58* 4.75   HGB 14.2 14.9   HEMATOCRIT 40.3* 41.8*   MCV 88.0 88.0   PLATELETS 221 211         Recent Labs  Lab 11/08/14  0517 11/07/14  0340   SODIUM 140 143   POTASSIUM 4.3 3.9   CHLORIDE 109 111   CO2 25 24   BUN 15 21   CREATININE 1.0 1.0   GLUCOSE 97 105*   CALCIUM 9.2 9.3   MAGNESIUM  --  2.2               Recent Labs  Lab 11/07/14  0340   TROPONIN I 0.02         Recent Labs  Lab 11/07/14  0340   PT INR 0.8*   PT 11.7*   PTT 25       Microbiology Results     None           RADIOLOGY     Radiological Procedure personally reviewed and concur with radiologist reports unless stated otherwise.    Echocardiogram Adult Comp W Clr/Dop/Bubble   Final Result         1.  The quality of the study is technically adequate for interpretation.   2.  The left ventricle is normal in size. Left ventricular systolic   function is normal. There are no discrete regional wall motion   abnormalities. Estimated EF is  60%. There is no left  ventricular   hypertrophy. There is Doppler evidence of reduced left ventricular   compliance.   3.  The left atrium is normal in size.   4.  The aortic valve is trileaflet and mildly sclerotic. On the aortic   aspect of the right coronary cusp, located at the cusp margin, there is   a 4 mm echodensity, visualized in multiple planes, with an appearance   consistent with a focal calcification but also consistent with the   possibility of a vegetation. Normal valve excursion is present. There is   mild to moderate, centrally directed, aortic insufficiency. No aortic   stenosis is present. The aortic root is normal in size.   5.  The mitral valve leaflets are minimally thickened, with normal   leaflet excursion. Mild, centrally directed, mitral regurgitation is   present.   6.  The right ventricle is normal in size and contractility.   7.  The right atrium is normal in size.   8.  The tricuspid valve is structurally normal. There is trace tricuspid   regurgitation.   9.  The pulmonic valve is structurally normal. There is trace pulmonic   insufficiency.   10.  There is no Doppler evidence of pulmonary hypertension.   11.  No pericardial effusion, intracardiac thrombi, or masses are   visualized.   12.  The atrial septum is structurally normal, without shunt by color   flow Doppler.   13.  No prior studies are available for comparison.   14.  The echocardiographic abnormality of the aortic valve was   communicated  by telephone to Brand Males, MD, at 1:00 PM.      Lanette Hampshire, MD    11/08/2014 1:13 PM         US CAROTID DUPLEX DOPP COMP   Final Result         No hemodynamically significant stenosis seen in bilateral carotid   arteries.      Kristine Linea, MD    11/07/2014 5:40 PM         MRI brain without contrast   Final Result    No acute intracranial abnormality. Mild nonspecific white   matter changes which may reflect chronic small vessel ischemic change in   a patient of this age.      Georgann Housekeeper, MD     11/07/2014 1:35 PM         Chest AP Portable   Final Result    Portable AP radiographic examination of the chest shows no   acute abnormality. The heart appears top normal in size.     There is no   evidence of CHF or pneumonia.                   Miguel Dibble, MD    11/07/2014 4:11 AM         CT Head WO Contrast   Final Result    CT scan of the head shows no hydrocephalus, herniation, or   intracranial hemorrhage. There is no visible acute intracranial   abnormality.       An acute stroke, if present, is not visible on this   examination.  Correlate clinically to determine whether further imaging   with MRI is indicated.                         Miguel Dibble, MD    11/07/2014 4:00 AM         Echocardiogram Adult Transesophageal    (Results Pending)       Signed,  Brand Males, MD  4:12 PM 11/08/2014

## 2014-11-09 ENCOUNTER — Observation Stay: Payer: Commercial Managed Care - POS | Admitting: Pain Medicine

## 2014-11-09 ENCOUNTER — Observation Stay: Payer: Commercial Managed Care - POS

## 2014-11-09 LAB — GFR: EGFR: >60

## 2014-11-09 LAB — CBC
Hematocrit: 43 % (ref 42.0–52.0)
Hgb: 15.3 g/dL (ref 13.0–17.0)
MCH: 31 pg (ref 28.0–32.0)
MCHC: 35.6 g/dL (ref 32.0–36.0)
MCV: 87 fL (ref 80.0–100.0)
MPV: 10 fL (ref 9.4–12.3)
Nucleated RBC: 0 /100 WBC (ref 0–1)
Platelets: 227 10*3/uL (ref 140–400)
RBC: 4.94 10*6/uL (ref 4.70–6.00)
RDW: 13 % (ref 12–15)
WBC: 5.08 10*3/uL (ref 3.50–10.80)

## 2014-11-09 LAB — BASIC METABOLIC PANEL
Anion Gap: 6 (ref 5.0–15.0)
BUN: 21 mg/dL (ref 9–28)
CO2: 26 mEq/L (ref 22–29)
Calcium: 9.4 mg/dL (ref 8.5–10.5)
Chloride: 108 mEq/L (ref 100–111)
Creatinine: 1 mg/dL (ref 0.7–1.3)
Glucose: 99 mg/dL (ref 70–100)
Potassium: 4.3 mEq/L (ref 3.5–5.1)
Sodium: 140 mEq/L (ref 136–145)

## 2014-11-09 MED ORDER — SODIUM CHLORIDE 0.9 % IV SOLN
INTRAVENOUS | Status: DC | PRN
Start: 2014-11-09 — End: 2014-11-09

## 2014-11-09 MED ORDER — PROPOFOL INFUSION 10 MG/ML
INTRAVENOUS | Status: DC | PRN
Start: 2014-11-09 — End: 2014-11-09
  Administered 2014-11-09 (×4): 10 mg via INTRAVENOUS
  Administered 2014-11-09: 50 mg via INTRAVENOUS
  Administered 2014-11-09 (×2): 10 mg via INTRAVENOUS

## 2014-11-09 MED ORDER — LIDOCAINE HCL 2 % IJ SOLN
INTRAMUSCULAR | Status: DC | PRN
Start: 2014-11-09 — End: 2014-11-09
  Administered 2014-11-09: 50 mg

## 2014-11-09 NOTE — Transfer of Care (Signed)
Patient stable, care transferred.

## 2014-11-09 NOTE — Progress Notes (Signed)
NEUROLOGY PROGRESS NOTE    Date Time: 11/09/2014 10:07 AM  Patient Name: Matthew Dudley  Attending Physician: Shawnee Knapp, MD    BRIEF HISTORY:   REASON FOR CONSULTATION:  Transient left arm numbness and weakness.    HISTORY OF PRESENT ILLNESS:  The patient is a 59 year old overall healthy man who comes into the  hospital with above complaints. The patient woke up at 2:30 a.m., his left  arm was numb. He also could not use his left hand properly to tie buttons  or shoelaces. The patient came to the ER within an hour. Due to low NIH  stroke scale, improving symptoms, he is not felt to be a candidate for  thrombolytic therapy. The patient afebrile, hemodynamically stable, blood  pressure elevated at 164/101. CT brain scan is negative. The patient is  admitted to the hospital for further evaluation. His symptoms lasted about  3 hours, completely resolved. He denies any headache, dizziness, facial  numbness, slurred speech, facial droop, no chest pain, palpitation, cough,  hemoptysis, abdominal pain, diarrhea, dysuria, hematuria. No head or neck  trauma, no neck pain, no problem with left leg such as numbness, tingling,  weakness.      Chief Complaints:   Left arm OK    Interval History/24 hour events:   TEE just completed  Official results pending      Review Of Systems:  No headaches, dizziness, confusion, focal weakness or numbness  No deafness, tinnitus, blurred vision, or vision loss, No double vision or droopy lids  Sleep good, no anxiety or depression, memory preserved  No speech or swallowing problems  Balance intact, No Falls  No back or neck pain  No bowel or bladder incontinence  No fever or chills        Physical Exam:     Filed Vitals:    11/09/14 0800   BP: 150/94   Pulse: 70   Temp: 96.5 F (35.8 C)   Resp: 16   SpO2: 97%       Intake and Output Summary (Last 24 hours) at Date Time  No intake or output data in the 24 hours ending 11/09/14 1007    General: No apparent distress,  comfortable appearing, vital signs noted above  Head: Atraumatic and normocephalic  Eyes: Anicteric sclerae, moist conjunctiva, PERRLA   ENT: Oropharynx clear; no erythema or exudates;   Neck: supple; FROM; no mass   Extremities: No clubbing, cyanosis or edema  CV: Regular rate and rhythm, no murmurs  Lungs: clear  Skin: No rash, lesions, ulcers   Neurological Examination  Mental Status:   Alert, oriented, to time, place, person and situation   Clear speech, normal language, including fluency, naming,     Normal short term and remote memory.  Adequate knowledge.   Affect appropriate   CN 2-12    EOM full, PERL, Normal fields and fundi, Face symmetric, tongue midline with normal bulk and no fasicualtions  Hearing, facial sensations and Gag reflex intact   Motor system: Normal tone, bulk, no involuntary movements.     Power 5/5 and  Symmetric in arms and legs.  No cog wheeling, tremors, or bradykinesia.   Sensory system: Normal touch, pin prick vibration in all 4 extremities; no sensory level   Reflexes:  Deep tendon reflexes symmetric, Negative Babinski  Cerebellar:  Normal   Gait: Normal  Spine: Non tender full Range of motion  Neurovascular: No bruits.  Signs of meningeal irritation: None  Other pertinent positive findings:       Meds:     Current Facility-Administered Medications   Medication Dose Route Frequency   . amLODIPine  5 mg Oral Daily   . aspirin  81 mg Oral Daily   . atorvastatin  40 mg Oral QPM   . enoxaparin  40 mg Subcutaneous QAM       Labs:     Results     Procedure Component Value Units Date/Time    Basic Metabolic Panel [[id number]] Collected:  11/09/14 0607    Specimen Information:  Blood Updated:  11/09/14 0723     Glucose 99 mg/dL      BUN 21 mg/dL      Creatinine 1.0 mg/dL      CALCIUM 9.4 mg/dL      Sodium 829 mEq/L      Potassium 4.3 mEq/L      Chloride 108 mEq/L      CO2 26 mEq/L      Anion Gap 6.0     GFR [[id number]] Collected:  11/09/14 0607     EGFR >60.0 Updated:  11/09/14 0723     CBC [[id number]] Collected:  11/09/14 0607    Specimen Information:  Blood / Blood Updated:  11/09/14 0704     WBC 5.08 x10 3/uL      Hgb 15.3 g/dL      Hematocrit 28.4 %      Platelets 227 x10 3/uL      RBC 4.94 x10 6/uL      MCV 87.0 fL      MCH 31.0 pg      MCHC 35.6 g/dL      RDW 13 %      MPV 10.0 fL      Nucleated RBC 0 /100 WBC     CULTURE BLOOD AEROBIC AND ANAEROBIC [[id number]] Collected:  11/08/14 1501    Specimen Information:  Blood, Venipuncture Updated:  11/08/14 1919    Narrative:      L AC  1 BLUE+1 PURPLE            Imaging personally reviewed, including:     Radiology Results (24 Hour)     ** No results found for the last 24 hours. **            Assessment:     Patient Active Problem List   Diagnosis   . TIA (transient ischemic attack)   . Transient cerebral ischemia, unspecified type     Neurologically stable    Plan/Recommendations:   General measures:  Diet/activity/Precuations: Per routine from admitting orders  Symptomatic measures: as needed  Diagnostics:MRI/Carotid doppler/Echo  Reviewed, TEE preliminary negative  Medications:Noted, Optimum BP control  Consultation:Noted  Physical/Occupational/Speech Therapy:As needed  Patient Education/Counseling:Provided  Follow up with MD in [place]            Anticipated discharge disposition and date: [place] planning    Case discussed with: Nursing/Attending        Signed by: Shawnee Knapp, MD

## 2014-11-09 NOTE — Discharge Summary (Signed)
[facility]      Patient: Matthew Dudley [facility]  Admission Date: 11/07/2014   DOB: 10-Jan-1956  Discharge Date: 11/09/2014    MRN: [id number]  Discharge Attending: Buzzy Han MD   Referring Physician: Md, Out Of Network  PCP: Md, Out Of Network       DISCHARGE SUMMARY     Discharge Information   Admission Diagnosis: TIA    Discharge Diagnosis:   Patient Active Problem List    Diagnosis Date Noted   . Transient cerebral ischemia, unspecified type    . TIA (transient ischemic attack) 11/07/2014      Admission Condition: fair  Discharge Condition: fair  Functional Status: Patient is independent with mobility/ambulation, transfers, ADL's, IADL's.    Discharge Medications:     Medication List      START taking these medications          amLODIPine 5 MG tablet   Commonly known as:  NORVASC   Take 1 tablet (5 mg total) by mouth daily.       aspirin 81 MG chewable tablet   Chew 1 tablet (81 mg total) by mouth daily.       atorvastatin 40 MG tablet   Commonly known as:  LIPITOR   Take 1 tablet (40 mg total) by mouth every evening.         Where to Get Your Medications     You need to pick up these prescriptions. We sent them to a specific pharmacy, so go there to get them.           [facility]   -  amLODIPine 5 MG tablet   -  aspirin 81 MG chewable tablet   -  atorvastatin 40 MG tablet    [address]   [place] [address]   Phone:  [phone]   Hours:  9AM to 7PM Monday - Friday 10AM to [facility] Saturday                          Hospital Course   Presentation History   [name] is a 59 year old male with no significant past medical history  who presented to the ED earlier tonight secondary to acute onset of left  upper extremity numbness. The patient states that he was awoken in the  middle of the night with flailing left upper extremity and numbness. The  patient at that time denied any other associated symptoms, no associated  headache, no vision changes, no difficulty in  swallowing, no slurred  speech. He denied any associated chest pressure or chest discomfort. No  left lower extremity paresthesias or weakness. He denied any associated  left upper extremity weakness; however, he did note some flailing of the  extremity. While present in the emergency department, he stated his  symptoms have resolved.    See HPI for details.    Hospital Course ( Days)     Dispo: Home    1. Left arm numbess - likely TIA  CT head negative for acute CVA. MRI head negative for without acute intracranial abnormality. Carotid doppler without significant stenosis. Dr. Adline Mango consulted, appreciate help. Lipid panel with LDL 84 and low HDL 34 - cont statin. Cont asa. TSH normal. HbA1C 5.5.    4mm echodensity on the right coronary cusp of aortic valve on TTE  Read as a focal calcification vs vegetation, TEE showed some calcifacation. Negative for endocarditis.  HTN - no previous dx of HTN (although pt admits to not seeing his PCP in years)  SBP during this admission mostly 140-150's. Started on norvasc 5mg  daily. Educated about low salt diet, exercise.    Procedures/Imaging:   Echocardiogram Adult Transesophageal W Color Doppler Waveform   Final Result         The left ventricle is normal in size. Left ventricular function is   normal.Regional wall motion abnormalities are absent.  Estimated EF is   65%.       The left atrium and left atrial appendage demonstrate no clot.      The aortic valve is trileaflet. There is an area of focal calcification   noted on the valve..  There is no aortic insufficiency. There is no   aortic stenosis. The aortic root is normal.      The mitral valve is structurally normal. There is no significant mitral   insufficiency.      The right ventricle is normal in size and normal in function.      The right atrium demonstrates no clot.      The tricuspid valve is structurally normal. There is physiologic   tricuspid insufficiency.    The pulmonic valve is structurally normal.  There is no significant   pulmonic insufficiency.      No pericardial effusion or intracardiac masses.      The atrial septum is structurally intact. There is no shunt by color   flow doppler or by bubble study.      The descending thoracic aorta was visualized and appeared free of   detectable atherosclerosis, thrombus, or spontaneous echo contrast.       CONCLUSION:   Normal TEE except for a small area of focal calcification on the aortic   valve      Jodelle Gross, MD    11/09/2014 2:58 PM         Echocardiogram Adult Comp W Clr/Dop/Bubble   Final Result         1.  The quality of the study is technically adequate for interpretation.   2.  The left ventricle is normal in size. Left ventricular systolic   function is normal. There are no discrete regional wall motion   abnormalities. Estimated EF is  60%. There is no left ventricular   hypertrophy. There is Doppler evidence of reduced left ventricular   compliance.   3.  The left atrium is normal in size.   4.  The aortic valve is trileaflet and mildly sclerotic. On the aortic   aspect of the right coronary cusp, located at the cusp margin, there is   a 4 mm echodensity, visualized in multiple planes, with an appearance   consistent with a focal calcification but also consistent with the   possibility of a vegetation. Normal valve excursion is present. There is   mild to moderate, centrally directed, aortic insufficiency. No aortic   stenosis is present. The aortic root is normal in size.   5.  The mitral valve leaflets are minimally thickened, with normal   leaflet excursion. Mild, centrally directed, mitral regurgitation is   present.   6.  The right ventricle is normal in size and contractility.   7.  The right atrium is normal in size.   8.  The tricuspid valve is structurally normal. There is trace tricuspid   regurgitation.   9.  The pulmonic valve is structurally normal. There is trace pulmonic  insufficiency.   10.  There is no Doppler evidence of pulmonary  hypertension.   11.  No pericardial effusion, intracardiac thrombi, or masses are   visualized.   12.  The atrial septum is structurally normal, without shunt by color   flow Doppler.   13.  No prior studies are available for comparison.   14.  The echocardiographic abnormality of the aortic valve was   communicated by telephone to Brand Males, MD, at 1:00 PM.      Lanette Hampshire, MD    11/08/2014 1:13 PM         US CAROTID DUPLEX DOPP COMP   Final Result         No hemodynamically significant stenosis seen in bilateral carotid   arteries.      Kristine Linea, MD    11/07/2014 5:40 PM         MRI brain without contrast   Final Result    No acute intracranial abnormality. Mild nonspecific white   matter changes which may reflect chronic small vessel ischemic change in   a patient of this age.      Georgann Housekeeper, MD    11/07/2014 1:35 PM         Chest AP Portable   Final Result    Portable AP radiographic examination of the chest shows no   acute abnormality. The heart appears top normal in size.     There is no   evidence of CHF or pneumonia.                   Miguel Dibble, MD    11/07/2014 4:11 AM         CT Head WO Contrast   Final Result    CT scan of the head shows no hydrocephalus, herniation, or   intracranial hemorrhage. There is no visible acute intracranial   abnormality.       An acute stroke, if present, is not visible on this   examination.  Correlate clinically to determine whether further imaging   with MRI is indicated.                         Miguel Dibble, MD    11/07/2014 4:00 AM             Treatment Team:   Consulting Physician: Dawayne Patricia, MD  Consulting Physician: Shawnee Knapp, MD       Best Practices   Was the patient admitted with either a CHF Exacerbation or Pneumonia? No     Progress Note/Physical Exam at Discharge     Subjective: Ready to go home.    Filed Vitals:    11/08/14 2356 11/09/14 0355 11/09/14 0800 11/09/14 1150   BP: 148/82 135/81 150/94 145/94   Pulse: 68 63 70 68   Temp: 97.2  F (36.2 C) 97.2 F (36.2 C) 96.5 F (35.8 C) 95.5 F (35.3 C)   TempSrc: Oral Oral  Oral   Resp: 16 16 16 16    Height:       Weight:       SpO2: 97% 97% 97% 99%       General: NAD, AAOx3  HEENT: perrla, eomi, sclera anicteric, OP: Clear, MMM  Neck: supple, FROM, no LAD  Cardiovascular: RRR, no m/r/g  Lungs: CTAB, no w/r/r  Abdomen: soft, +BS, NT/ND, no masses, no g/r  Extremities: no C/C/E  Skin: no rashes or lesions noted  Neuro: CN 2-12 intact; No Focal neurological deficits       Diagnostics     Labs/Studies Pending at Discharge: Yes    Last Labs     Recent Labs  Lab 11/09/14  0607 11/08/14  0517 11/07/14  0340   WBC 5.08 5.76 5.18   RBC 4.94 4.58* 4.75   HGB 15.3 14.2 14.9   HEMATOCRIT 43.0 40.3* 41.8*   MCV 87.0 88.0 88.0   PLATELETS 227 221 211         Recent Labs  Lab 11/09/14  0607 11/08/14  0517 11/07/14  0340   SODIUM 140 140 143   POTASSIUM 4.3 4.3 3.9   CHLORIDE 108 109 111   CO2 26 25 24    BUN 21 15 21    CREATININE 1.0 1.0 1.0   GLUCOSE 99 97 105*   CALCIUM 9.4 9.2 9.3   MAGNESIUM  --   --  2.2             Microbiology Results     Procedure Component Value Units Date/Time    CULTURE BLOOD AEROBIC AND ANAEROBIC [[id number]] Collected:  11/08/14 1501    Specimen Information:  Blood, Venipuncture Updated:  11/08/14 1919    Narrative:      L AC  1 BLUE+1 PURPLE            Recent Labs  Lab 11/09/14  0607 11/08/14  0517 11/07/14  0340   WBC 5.08 5.76 5.18   RBC 4.94 4.58* 4.75   HGB 15.3 14.2 14.9   HEMATOCRIT 43.0 40.3* 41.8*   MCV 87.0 88.0 88.0   PLATELETS 227 221 211         Recent Labs  Lab 11/09/14  0607 11/08/14  0517 11/07/14  0340   SODIUM 140 140 143   POTASSIUM 4.3 4.3 3.9   CHLORIDE 108 109 111   CO2 26 25 24    BUN 21 15 21    CREATININE 1.0 1.0 1.0   GLUCOSE 99 97 105*   CALCIUM 9.4 9.2 9.3   MAGNESIUM  --   --  2.2               Recent Labs  Lab 11/07/14  0340   TROPONIN I 0.02         Recent Labs  Lab 11/07/14  0340   PT INR 0.8*   PT 11.7*   PTT 25          Patient Instructions   Discharge  Diet: regular diet  Discharge Activity:  activity as tolerated    Follow Up Appointment: PCP in 1 week.       Time spent examining patient, discussing with patient/family regarding hospital course, chart review, reconciling medications and discharge planning: 45 minutes.    Signed,  Buzzy Han, MD  6:22 PM 11/09/2014

## 2014-11-09 NOTE — Discharge Summary -  Nursing (Signed)
Discharge instruction given to pt and family. Verbalized understanding. IV out. Telemetry returned. No s/s of distress noted. Pt left the unit via wheelchair with volunteer.

## 2014-11-09 NOTE — Progress Notes (Signed)
Patient in ICU for TEE procedure with Dr. Marcelo Baldy and Dr. Wyvonnia Dusky.  Tolerated well, VSS throughout procedure.  Patient currently awake and alert, wife at bedside.

## 2014-11-09 NOTE — Anesthesia Preprocedure Evaluation (Signed)
Anesthesia Evaluation    AIRWAY    Mallampati: II    TM distance: >3 FB  Neck ROM: full  Mouth Opening:full   CARDIOVASCULAR    cardiovascular exam normal       DENTAL    no notable dental hx     PULMONARY    pulmonary exam normal     OTHER FINDINGS                      Anesthesia Plan    ASA 2     general                     intravenous induction   Detailed anesthesia plan: general IV            informed consent obtained    ECG reviewed  pertinent labs reviewed

## 2014-11-09 NOTE — Anesthesia Postprocedure Evaluation (Signed)
Anesthesia Post Evaluation    Patient: Matthew Dudley [facility]    Procedures performed: * No procedures listed *    Anesthesia type: General TIVA    Patient location:ICU    Last vitals:   Filed Vitals:    11/09/14 0800   BP: 150/94   Pulse: 70   Temp: 35.8 C (96.5 F)   Resp: 16   SpO2: 97%       Post pain: Patient not complaining of pain, continue current therapy      Mental Status:awake    Respiratory Function: tolerating nasal cannula    Cardiovascular: stable    Nausea/Vomiting: patient not complaining of nausea or vomiting    Hydration Status: adequate    Post assessment: no apparent anesthetic complications

## 2014-11-09 NOTE — Plan of Care (Signed)
Problem: Day 3- Stroke  Goal: Stable vital signs and fluid balance  Outcome: Progressing  Patient Admitted For:tia  Current Assessed Risk Factors:              Non-modifiable (age, gender, race, family history, previous stroke or TIA): age, gender              Modifiable (smoking, obesity, alcohol, illegal drug use): na              Modifiable with Doctor's help (hypertension, DM,carotid, A-fib, high chol):  High cholesterol, htn               Doctor prescribed: Norvasc, ASA, Lipitor  Patient/Family Stroke Prevention Goals: (list stated lifestyle changes/goals) maintaining healthy lifestyle and control HTN and High chol  Education Materials Given To: Pt and family  Response To Teaching As Evidenced By: (summarize patient/family response) verbalized  Goals For Today (imaging, lab tests, q 4hr neuro checks, education): q4h neuro check, education

## 2016-01-21 ENCOUNTER — Encounter (INDEPENDENT_AMBULATORY_CARE_PROVIDER_SITE_OTHER): Payer: Managed Care, Other (non HMO) | Admitting: Ophthalmology

## 2016-01-21 DIAGNOSIS — I10 Essential (primary) hypertension: Secondary | ICD-10-CM

## 2016-01-21 DIAGNOSIS — H33302 Unspecified retinal break, left eye: Secondary | ICD-10-CM

## 2016-01-21 DIAGNOSIS — H2513 Age-related nuclear cataract, bilateral: Secondary | ICD-10-CM

## 2016-01-21 DIAGNOSIS — H43813 Vitreous degeneration, bilateral: Secondary | ICD-10-CM

## 2016-01-21 DIAGNOSIS — H35033 Hypertensive retinopathy, bilateral: Secondary | ICD-10-CM

## 2016-01-29 ENCOUNTER — Ambulatory Visit (INDEPENDENT_AMBULATORY_CARE_PROVIDER_SITE_OTHER): Payer: Managed Care, Other (non HMO) | Admitting: Ophthalmology

## 2016-01-29 DIAGNOSIS — H33302 Unspecified retinal break, left eye: Secondary | ICD-10-CM

## 2016-03-17 ENCOUNTER — Ambulatory Visit: Payer: Self-pay | Admitting: Orthopedic Surgery

## 2016-04-22 ENCOUNTER — Encounter (HOSPITAL_COMMUNITY): Payer: Self-pay

## 2016-04-22 ENCOUNTER — Encounter (HOSPITAL_COMMUNITY)
Admission: RE | Admit: 2016-04-22 | Discharge: 2016-04-22 | Disposition: A | Discharge status: Home / Self Care | Payer: Managed Care, Other (non HMO) | Source: Ambulatory Visit | Attending: Orthopedic Surgery | Admitting: Orthopedic Surgery

## 2016-04-22 ENCOUNTER — Other Ambulatory Visit: Payer: Self-pay

## 2016-04-22 ENCOUNTER — Ambulatory Visit: Payer: Self-pay | Admitting: Orthopedic Surgery

## 2016-04-22 DIAGNOSIS — Z01812 Encounter for preprocedural laboratory examination: Secondary | ICD-10-CM | POA: Diagnosis not present

## 2016-04-22 DIAGNOSIS — Z0183 Encounter for blood typing: Secondary | ICD-10-CM | POA: Insufficient documentation

## 2016-04-22 DIAGNOSIS — Z01818 Encounter for other preprocedural examination: Secondary | ICD-10-CM | POA: Diagnosis not present

## 2016-04-22 DIAGNOSIS — I10 Essential (primary) hypertension: Secondary | ICD-10-CM | POA: Diagnosis not present

## 2016-04-22 DIAGNOSIS — M1612 Unilateral primary osteoarthritis, left hip: Secondary | ICD-10-CM | POA: Diagnosis not present

## 2016-04-22 HISTORY — DX: Unspecified osteoarthritis, unspecified site: M19.90

## 2016-04-22 HISTORY — DX: Transient cerebral ischemic attack, unspecified: G45.9

## 2016-04-22 HISTORY — DX: Essential (primary) hypertension: I10

## 2016-04-22 LAB — BASIC METABOLIC PANEL
Anion gap: 8 (ref 5–15)
BUN: 23 mg/dL — ABNORMAL HIGH (ref 6–20)
CALCIUM: 9.5 mg/dL (ref 8.9–10.3)
CO2: 26 mmol/L (ref 22–32)
CREATININE: 0.97 mg/dL (ref 0.61–1.24)
Chloride: 107 mmol/L (ref 101–111)
GFR calc non Af Amer: >60 mL/min (ref >60)
GLUCOSE: 98 mg/dL (ref 65–99)
Potassium: 4 mmol/L (ref 3.5–5.1)
Sodium: 141 mmol/L (ref 135–145)

## 2016-04-22 LAB — CBC
HCT: 40.7 % (ref 39.0–52.0)
Hemoglobin: 14.1 g/dL (ref 13.0–17.0)
MCH: 30.5 pg (ref 26.0–34.0)
MCHC: 34.6 g/dL (ref 30.0–36.0)
MCV: 87.9 fL (ref 78.0–100.0)
PLATELETS: 243 10*3/uL (ref 150–400)
RBC: 4.63 MIL/uL (ref 4.22–5.81)
RDW: 14 % (ref 11.5–15.5)
WBC: 7.7 10*3/uL (ref 4.0–10.5)

## 2016-04-22 LAB — TYPE AND SCREEN
ABO/RH(D): A NEG
ANTIBODY SCREEN: NEGATIVE

## 2016-04-22 LAB — SURGICAL PCR SCREEN
MRSA, PCR: NEGATIVE
Staphylococcus aureus: NEGATIVE

## 2016-04-22 LAB — ABO/RH: ABO/RH(D): A NEG

## 2016-04-22 NOTE — H&P (Signed)
TOTAL HIP ADMISSION H&P  Patient is admitted for left total hip arthroplasty.  Subjective:  Chief Complaint: left hip pain  HPI: Jerome Shaffer, 60 y.o. male, has a history of pain and functional disability in the left hip(s) due to arthritis and patient has failed non-surgical conservative treatments for greater than 12 weeks to include NSAID's and/or analgesics, flexibility and strengthening excercises, use of assistive devices and activity modification.  Onset of symptoms was gradual starting 2 years ago with rapidlly worsening course since that time.The patient noted no past surgery on the left hip(s).  Patient currently rates pain in the left hip at 10 out of 10 with activity. Patient has night pain, worsening of pain with activity and weight bearing, pain that interfers with activities of daily living and pain with passive range of motion. Patient has evidence of subchondral cysts, subchondral sclerosis, periarticular osteophytes and joint space narrowing by imaging studies. This condition presents safety issues increasing the risk of falls.  There is no current active infection.  There are no active problems to display for this patient.  Past Medical History:  Diagnosis Date  . Arthritis   . Hypertension   . TIA (transient ischemic attack)     Past Surgical History:  Procedure Laterality Date  . APPENDECTOMY    . COLONOSCOPY    . ESOPHAGOGASTRODUODENOSCOPY    . EYE SURGERY     lasic, retina tear repair  . vasctectomy       (Not in a hospital admission) No Known Allergies  Social History  Substance Use Topics  . Smoking status: Never Smoker  . Smokeless tobacco: Never Used  . Alcohol use Yes     Comment: occasional    No family history on file.   Review of Systems  Constitutional: Negative.   HENT: Negative.   Eyes: Negative.   Respiratory: Negative.   Cardiovascular: Negative.   Gastrointestinal: Negative.   Genitourinary: Negative.   Musculoskeletal:  Positive for joint pain.  Skin: Negative.   Neurological: Negative.   Endo/Heme/Allergies: Negative.   Psychiatric/Behavioral: Negative.     Objective:  Physical Exam  Vitals reviewed. Constitutional: He is oriented to person, place, and time. He appears well-developed and well-nourished.  HENT:  Head: Normocephalic and atraumatic.  Eyes: Conjunctivae and EOM are normal. Pupils are equal, round, and reactive to light.  Neck: Normal range of motion. Neck supple.  Cardiovascular: Normal rate, regular rhythm and intact distal pulses.   Respiratory: Effort normal. No respiratory distress.  GI: Soft. He exhibits no distension.  Genitourinary:  Genitourinary Comments: deferred  Musculoskeletal:       Left hip: He exhibits decreased range of motion and tenderness.  Neurological: He is alert and oriented to person, place, and time. He has normal reflexes.  Skin: Skin is warm and dry.  Psychiatric: He has a normal mood and affect. His behavior is normal. Judgment and thought content normal.    Vital signs in last 24 hours: @VSRANGES @  Labs:   Estimated body mass index is 25.76 kg/m as calculated from the following:   Height as of an earlier encounter on 04/22/16: 5' 5.5" (1.664 m).   Weight as of an earlier encounter on 04/22/16: 71.3 kg (157 lb 3 oz).   Imaging Review Plain radiographs demonstrate severe degenerative joint disease of the left hip(s). The bone quality appears to be adequate for age and reported activity level.  Assessment/Plan:  End stage arthritis, left hip(s)  The patient history, physical examination, clinical judgement  of the provider and imaging studies are consistent with end stage degenerative joint disease of the left hip(s) and total hip arthroplasty is deemed medically necessary. The treatment options including medical management, injection therapy, arthroscopy and arthroplasty were discussed at length. The risks and benefits of total hip arthroplasty  were presented and reviewed. The risks due to aseptic loosening, infection, stiffness, dislocation/subluxation,  thromboembolic complications and other imponderables were discussed.  The patient acknowledged the explanation, agreed to proceed with the plan and consent was signed. Patient is being admitted for inpatient treatment for surgery, pain control, PT, OT, prophylactic antibiotics, VTE prophylaxis, progressive ambulation and ADL's and discharge planning.The patient is planning to be discharged home with home health services. (+) TXA, apixaban.

## 2016-04-22 NOTE — Progress Notes (Signed)
PCP - Maury Dus [facility] Cardiologist - denies  Chest x-ray - not needed EKG - 04/22/16 Stress Test - denies ECHO - requesting from PCP Cardiac Cath - denies  Stopped Aspirin 04/21/16    Patient denies shortness of breath, fever, cough and chest pain at PAT appointment

## 2016-04-22 NOTE — Pre-Procedure Instructions (Signed)
Jerome Shaffer  04/22/2016      [facility] [place], [place] AT [facility] [address] [facility] [address] [place] [address] Phone: [phone] Fax: [phone]    Your procedure is scheduled on Monday October 23  Report to [facility] Admitting at [place].M.  Call this number if you have problems the morning of surgery:  [phone]   Remember:  Do not eat food or drink liquids after midnight.   Take these medicines the morning of surgery with A SIP OF WATER amLODipine (NORVASC),   7 days prior to surgery STOP taking any Aspirin, Aleve, Naproxen, Ibuprofen, Motrin, Advil, Goody's, BC's, all herbal medications, fish oil, and all vitamins    Do not wear jewelry.  Do not wear lotions, powders, or cologne, or deoderant.  Men may shave face and neck.  Do not bring valuables to the hospital.  [facility] is not responsible for any belongings or valuables.  Contacts, dentures or bridgework may not be worn into surgery.  Leave your suitcase in the car.  After surgery it may be brought to your room.  For patients admitted to the hospital, discharge time will be determined by your treatment team.  Patients discharged the day of surgery will not be allowed to drive home.    Special instructions:   [place]- Preparing For Surgery  Before surgery, you can play an important role. Because skin is not sterile, your skin needs to be as free of germs as possible. You can reduce the number of germs on your skin by washing with CHG (chlorahexidine gluconate) Soap before surgery.  CHG is an antiseptic cleaner which kills germs and bonds with the skin to continue killing germs even after washing.  Please do not use if you have an allergy to CHG or antibacterial soaps. If your skin becomes reddened/irritated stop using the CHG.  Do not shave (including legs and underarms) for at least 48 hours prior to first CHG shower. It  is OK to shave your face.  Please follow these instructions carefully.   1. Shower the NIGHT BEFORE SURGERY and the MORNING OF SURGERY with CHG.   2. If you chose to wash your hair, wash your hair first as usual with your normal shampoo.  3. After you shampoo, rinse your hair and body thoroughly to remove the shampoo.  4. Use CHG as you would any other liquid soap. You can apply CHG directly to the skin and wash gently with a scrungie or a clean washcloth.   5. Apply the CHG Soap to your body ONLY FROM THE NECK DOWN.  Do not use on open wounds or open sores. Avoid contact with your eyes, ears, mouth and genitals (private parts). Wash genitals (private parts) with your normal soap.  6. Wash thoroughly, paying special attention to the area where your surgery will be performed.  7. Thoroughly rinse your body with warm water from the neck down.  8. DO NOT shower/wash with your normal soap after using and rinsing off the CHG Soap.  9. Pat yourself dry with a CLEAN TOWEL.   10. Wear CLEAN PAJAMAS   11. Place CLEAN SHEETS on your bed the night of your first shower and DO NOT SLEEP WITH PETS.    Day of Surgery: Do not apply any deodorants/lotions. Please wear clean clothes to the hospital/surgery center.      Please read over the following  fact sheets that you were given.

## 2016-04-24 MED ORDER — TRANEXAMIC ACID 1000 MG/10ML IV SOLN
1000.0000 mg | INTRAVENOUS | Status: AC
  Administered 2016-04-27: 1000 mg via INTRAVENOUS
  Filled 2016-04-24: qty 10

## 2016-04-24 MED ORDER — ACETAMINOPHEN 10 MG/ML IV SOLN
1000.0000 mg | INTRAVENOUS | Status: AC
  Administered 2016-04-27: 1000 mg via INTRAVENOUS
  Filled 2016-04-24: qty 100

## 2016-04-26 ENCOUNTER — Ambulatory Visit: Payer: Self-pay | Admitting: Orthopedic Surgery

## 2016-04-26 NOTE — Anesthesia Preprocedure Evaluation (Addendum)
Anesthesia Evaluation  Patient identified by MRN, date of birth, ID band Patient awake    Reviewed: Allergy & Precautions, NPO status , Patient's Chart, lab work & pertinent test results  Airway Mallampati: II  TM Distance: >3 FB Neck ROM: Full    Dental  (+) Dental Advisory Given   Pulmonary neg pulmonary ROS,    breath sounds clear to auscultation       Cardiovascular hypertension, Pt. on medications  Rhythm:Regular Rate:Normal     Neuro/Psych TIA   GI/Hepatic negative GI ROS, Neg liver ROS,   Endo/Other  negative endocrine ROS  Renal/GU negative Renal ROS     Musculoskeletal   Abdominal   Peds  Hematology negative hematology ROS (+)   Anesthesia Other Findings   Reproductive/Obstetrics                            Lab Results  Component Value Date   WBC 7.7 04/22/2016   HGB 14.1 04/22/2016   HCT 40.7 04/22/2016   MCV 87.9 04/22/2016   PLT 243 04/22/2016   Lab Results  Component Value Date   CREATININE 0.97 04/22/2016   BUN 23 (H) 04/22/2016   NA 141 04/22/2016   K 4.0 04/22/2016   CL 107 04/22/2016   CO2 26 04/22/2016   No results found for: INR, PROTIME  Anesthesia Physical Anesthesia Plan  ASA: II  Anesthesia Plan: MAC and Spinal   Post-op Pain Management:    Induction: Intravenous  Airway Management Planned: Natural Airway and Simple Face Mask  Additional Equipment:   Intra-op Plan:   Post-operative Plan:   Informed Consent: I have reviewed the patients History and Physical, chart, labs and discussed the procedure including the risks, benefits and alternatives for the proposed anesthesia with the patient or authorized representative who has indicated his/her understanding and acceptance.     Plan Discussed with: CRNA  Anesthesia Plan Comments:        Anesthesia Quick Evaluation

## 2016-04-27 ENCOUNTER — Inpatient Hospital Stay (HOSPITAL_COMMUNITY): Payer: Managed Care, Other (non HMO)

## 2016-04-27 ENCOUNTER — Inpatient Hospital Stay (HOSPITAL_COMMUNITY)
Admission: RE | Admit: 2016-04-27 | Discharge: 2016-04-28 | DRG: 470 | Disposition: A | Discharge status: Home Health | Payer: Managed Care, Other (non HMO) | Source: Ambulatory Visit | Attending: Orthopedic Surgery | Admitting: Orthopedic Surgery

## 2016-04-27 ENCOUNTER — Inpatient Hospital Stay (HOSPITAL_COMMUNITY): Payer: Managed Care, Other (non HMO) | Admitting: Emergency Medicine

## 2016-04-27 ENCOUNTER — Encounter (HOSPITAL_COMMUNITY)
Admission: RE | Disposition: A | Discharge status: Home Health | Payer: Self-pay | Source: Ambulatory Visit | Attending: Orthopedic Surgery

## 2016-04-27 ENCOUNTER — Encounter (HOSPITAL_COMMUNITY): Payer: Self-pay | Admitting: Anesthesiology

## 2016-04-27 ENCOUNTER — Inpatient Hospital Stay (HOSPITAL_COMMUNITY): Payer: Managed Care, Other (non HMO) | Admitting: Anesthesiology

## 2016-04-27 DIAGNOSIS — I10 Essential (primary) hypertension: Secondary | ICD-10-CM | POA: Diagnosis present

## 2016-04-27 DIAGNOSIS — Z09 Encounter for follow-up examination after completed treatment for conditions other than malignant neoplasm: Secondary | ICD-10-CM

## 2016-04-27 DIAGNOSIS — M1612 Unilateral primary osteoarthritis, left hip: Secondary | ICD-10-CM | POA: Diagnosis present

## 2016-04-27 DIAGNOSIS — Z8673 Personal history of transient ischemic attack (TIA), and cerebral infarction without residual deficits: Secondary | ICD-10-CM | POA: Diagnosis not present

## 2016-04-27 DIAGNOSIS — Z9889 Other specified postprocedural states: Secondary | ICD-10-CM

## 2016-04-27 HISTORY — PX: TOTAL HIP ARTHROPLASTY: SHX124

## 2016-04-27 SURGERY — ARTHROPLASTY, HIP, TOTAL, ANTERIOR APPROACH
Anesthesia: Monitor Anesthesia Care | Site: Hip | Laterality: Left

## 2016-04-27 MED ORDER — METOCLOPRAMIDE HCL 5 MG PO TABS: 5.0000 mg | ORAL_TABLET | Freq: Three times a day (TID) | ORAL | Status: DC | PRN

## 2016-04-27 MED ORDER — 0.9 % SODIUM CHLORIDE (POUR BTL) OPTIME
TOPICAL | Status: DC | PRN
  Administered 2016-04-27: 1000 mL

## 2016-04-27 MED ORDER — BUPIVACAINE-EPINEPHRINE (PF) 0.5% -1:200000 IJ SOLN
INTRAMUSCULAR | Status: AC
  Filled 2016-04-27: qty 30

## 2016-04-27 MED ORDER — AMLODIPINE BESYLATE 5 MG PO TABS
5.0000 mg | ORAL_TABLET | Freq: Every day | ORAL | Status: DC
  Filled 2016-04-27: qty 1

## 2016-04-27 MED ORDER — CEFAZOLIN SODIUM-DEXTROSE 2-4 GM/100ML-% IV SOLN
2.0000 g | INTRAVENOUS | Status: AC
  Administered 2016-04-27: 2 g via INTRAVENOUS
  Filled 2016-04-27: qty 100

## 2016-04-27 MED ORDER — PROPOFOL 10 MG/ML IV BOLUS
INTRAVENOUS | Status: DC | PRN
  Administered 2016-04-27: 150 mg via INTRAVENOUS

## 2016-04-27 MED ORDER — LACTATED RINGERS IV SOLN
INTRAVENOUS | Status: DC | PRN
  Administered 2016-04-27 (×2): via INTRAVENOUS

## 2016-04-27 MED ORDER — ATORVASTATIN CALCIUM 40 MG PO TABS
40.0000 mg | ORAL_TABLET | Freq: Every day | ORAL | Status: DC
  Filled 2016-04-27: qty 1

## 2016-04-27 MED ORDER — DEXTROSE 5 % IV SOLN
INTRAVENOUS | Status: DC | PRN
  Administered 2016-04-27: 07:00:00 via INTRAVENOUS

## 2016-04-27 MED ORDER — PROMETHAZINE HCL 25 MG/ML IJ SOLN: 6.2500 mg | INTRAMUSCULAR | Status: DC | PRN

## 2016-04-27 MED ORDER — FENTANYL CITRATE (PF) 100 MCG/2ML IJ SOLN
INTRAMUSCULAR | Status: DC | PRN
  Administered 2016-04-27 (×4): 50 ug via INTRAVENOUS

## 2016-04-27 MED ORDER — METOCLOPRAMIDE HCL 5 MG/ML IJ SOLN: 5.0000 mg | Freq: Three times a day (TID) | INTRAMUSCULAR | Status: DC | PRN

## 2016-04-27 MED ORDER — ONDANSETRON HCL 4 MG/2ML IJ SOLN
INTRAMUSCULAR | Status: DC | PRN
  Administered 2016-04-27: 4 mg via INTRAVENOUS

## 2016-04-27 MED ORDER — PROPOFOL 10 MG/ML IV BOLUS
INTRAVENOUS | Status: AC
  Filled 2016-04-27: qty 40

## 2016-04-27 MED ORDER — CHLORHEXIDINE GLUCONATE 4 % EX LIQD
60.0000 mL | Freq: Once | CUTANEOUS | Status: DC
Start: 2016-04-27 — End: 2016-04-27

## 2016-04-27 MED ORDER — DEXAMETHASONE SODIUM PHOSPHATE 10 MG/ML IJ SOLN
INTRAMUSCULAR | Status: AC
  Filled 2016-04-27: qty 1

## 2016-04-27 MED ORDER — SODIUM CHLORIDE 0.9 % IR SOLN
Status: DC | PRN
  Administered 2016-04-27: 3000 mL

## 2016-04-27 MED ORDER — ASPIRIN 81 MG PO CHEW
81.0000 mg | CHEWABLE_TABLET | Freq: Two times a day (BID) | ORAL | Status: DC
  Administered 2016-04-28: 81 mg via ORAL
  Filled 2016-04-27 (×2): qty 1

## 2016-04-27 MED ORDER — SODIUM CHLORIDE 0.9 % IV SOLN
INTRAVENOUS | Status: DC
  Administered 2016-04-27: 08:00:00 via INTRAVENOUS

## 2016-04-27 MED ORDER — SODIUM CHLORIDE 0.9 % IJ SOLN
INTRAMUSCULAR | Status: DC | PRN
  Administered 2016-04-27: 30 mL

## 2016-04-27 MED ORDER — POVIDONE-IODINE 10 % EX SWAB: 2.0000 "application " | Freq: Once | CUTANEOUS | Status: DC

## 2016-04-27 MED ORDER — SODIUM CHLORIDE 0.9 % IV SOLN
INTRAVENOUS | Status: DC
  Administered 2016-04-27 – 2016-04-28 (×3): via INTRAVENOUS

## 2016-04-27 MED ORDER — ONDANSETRON HCL 4 MG/2ML IJ SOLN
INTRAMUSCULAR | Status: AC
  Filled 2016-04-27: qty 2

## 2016-04-27 MED ORDER — SODIUM CHLORIDE 0.9 % IR SOLN
Status: DC | PRN
Start: 2016-04-27 — End: 2016-04-27
  Administered 2016-04-27: 1000 mL

## 2016-04-27 MED ORDER — HYDROMORPHONE HCL 1 MG/ML IJ SOLN
0.2500 mg | INTRAMUSCULAR | Status: DC | PRN
  Administered 2016-04-27 (×2): 0.5 mg via INTRAVENOUS

## 2016-04-27 MED ORDER — KETOROLAC TROMETHAMINE 15 MG/ML IJ SOLN
15.0000 mg | Freq: Four times a day (QID) | INTRAMUSCULAR | Status: AC
  Administered 2016-04-27 – 2016-04-28 (×4): 15 mg via INTRAVENOUS
  Filled 2016-04-27 (×4): qty 1

## 2016-04-27 MED ORDER — ARTIFICIAL TEARS OP OINT
TOPICAL_OINTMENT | OPHTHALMIC | Status: AC
  Filled 2016-04-27: qty 3.5

## 2016-04-27 MED ORDER — HYDROMORPHONE HCL 2 MG/ML IJ SOLN: 0.5000 mg | INTRAMUSCULAR | Status: DC | PRN

## 2016-04-27 MED ORDER — PHENOL 1.4 % MT LIQD: 1.0000 | OROMUCOSAL | Status: DC | PRN

## 2016-04-27 MED ORDER — KETOROLAC TROMETHAMINE 30 MG/ML IJ SOLN
INTRAMUSCULAR | Status: AC
  Filled 2016-04-27: qty 1

## 2016-04-27 MED ORDER — TRANEXAMIC ACID 1000 MG/10ML IV SOLN
1000.0000 mg | Freq: Once | INTRAVENOUS | Status: AC
  Administered 2016-04-27: 1000 mg via INTRAVENOUS
  Filled 2016-04-27 (×2): qty 10

## 2016-04-27 MED ORDER — DEXAMETHASONE SODIUM PHOSPHATE 10 MG/ML IJ SOLN
10.0000 mg | Freq: Once | INTRAMUSCULAR | Status: AC
  Administered 2016-04-28: 10 mg via INTRAVENOUS
  Filled 2016-04-27: qty 1

## 2016-04-27 MED ORDER — ACETAMINOPHEN 650 MG RE SUPP: 650.0000 mg | Freq: Four times a day (QID) | RECTAL | Status: DC | PRN

## 2016-04-27 MED ORDER — METHOCARBAMOL 1000 MG/10ML IJ SOLN
500.0000 mg | Freq: Four times a day (QID) | INTRAMUSCULAR | Status: DC | PRN
  Filled 2016-04-27: qty 5

## 2016-04-27 MED ORDER — BUPIVACAINE-EPINEPHRINE 0.5% -1:200000 IJ SOLN
INTRAMUSCULAR | Status: DC | PRN
  Administered 2016-04-27: 30 mL

## 2016-04-27 MED ORDER — DOCUSATE SODIUM 100 MG PO CAPS
100.0000 mg | ORAL_CAPSULE | Freq: Two times a day (BID) | ORAL | Status: DC
  Administered 2016-04-27 – 2016-04-28 (×2): 100 mg via ORAL
  Filled 2016-04-27 (×2): qty 1

## 2016-04-27 MED ORDER — ACETAMINOPHEN 10 MG/ML IV SOLN: 1000.0000 mg | INTRAVENOUS | Status: DC

## 2016-04-27 MED ORDER — CEFAZOLIN IN D5W 1 GM/50ML IV SOLN
1.0000 g | Freq: Four times a day (QID) | INTRAVENOUS | Status: AC
  Administered 2016-04-27 (×2): 1 g via INTRAVENOUS
  Filled 2016-04-27 (×2): qty 50

## 2016-04-27 MED ORDER — LIDOCAINE HCL (CARDIAC) 20 MG/ML IV SOLN
INTRAVENOUS | Status: DC | PRN
  Administered 2016-04-27: 50 mg via INTRATRACHEAL

## 2016-04-27 MED ORDER — PROPOFOL 500 MG/50ML IV EMUL
INTRAVENOUS | Status: DC | PRN
  Administered 2016-04-27: 100 ug/kg/min via INTRAVENOUS

## 2016-04-27 MED ORDER — FENTANYL CITRATE (PF) 100 MCG/2ML IJ SOLN
INTRAMUSCULAR | Status: AC
  Filled 2016-04-27: qty 2

## 2016-04-27 MED ORDER — POLYETHYLENE GLYCOL 3350 17 G PO PACK: 17.0000 g | PACK | Freq: Every day | ORAL | Status: DC | PRN

## 2016-04-27 MED ORDER — MIDAZOLAM HCL 5 MG/5ML IJ SOLN
INTRAMUSCULAR | Status: DC | PRN
  Administered 2016-04-27 (×2): 1 mg via INTRAVENOUS

## 2016-04-27 MED ORDER — ACETAMINOPHEN 325 MG PO TABS
650.0000 mg | ORAL_TABLET | Freq: Four times a day (QID) | ORAL | Status: DC | PRN
Start: 2016-04-27 — End: 2016-04-28

## 2016-04-27 MED ORDER — HYDROMORPHONE HCL 2 MG/ML IJ SOLN
INTRAMUSCULAR | Status: AC
  Filled 2016-04-27: qty 1

## 2016-04-27 MED ORDER — POVIDONE-IODINE 7.5 % EX SOLN
CUTANEOUS | Status: DC | PRN
Start: 2016-04-27 — End: 2016-04-27

## 2016-04-27 MED ORDER — DEXAMETHASONE SODIUM PHOSPHATE 10 MG/ML IJ SOLN
INTRAMUSCULAR | Status: DC | PRN
  Administered 2016-04-27: 10 mg via INTRAVENOUS

## 2016-04-27 MED ORDER — POVIDONE-IODINE 10 % EX SOLN
CUTANEOUS | Status: DC | PRN
  Administered 2016-04-27: 1 via TOPICAL

## 2016-04-27 MED ORDER — HYDROCODONE-ACETAMINOPHEN 5-325 MG PO TABS
1.0000 | ORAL_TABLET | ORAL | Status: DC | PRN
  Administered 2016-04-27: 1 via ORAL
  Administered 2016-04-27 (×2): 2 via ORAL
  Administered 2016-04-27: 1 via ORAL
  Administered 2016-04-28: 2 via ORAL
  Administered 2016-04-28: 1 via ORAL
  Filled 2016-04-27: qty 2
  Filled 2016-04-27: qty 1
  Filled 2016-04-27 (×4): qty 2

## 2016-04-27 MED ORDER — ONDANSETRON HCL 4 MG/2ML IJ SOLN: 4.0000 mg | Freq: Four times a day (QID) | INTRAMUSCULAR | Status: DC | PRN

## 2016-04-27 MED ORDER — MENTHOL 3 MG MT LOZG: 1.0000 | LOZENGE | OROMUCOSAL | Status: DC | PRN

## 2016-04-27 MED ORDER — ONDANSETRON HCL 4 MG PO TABS: 4.0000 mg | ORAL_TABLET | Freq: Four times a day (QID) | ORAL | Status: DC | PRN

## 2016-04-27 MED ORDER — BUPIVACAINE IN DEXTROSE 0.75-8.25 % IT SOLN
INTRATHECAL | Status: DC | PRN
  Administered 2016-04-27: 2 mL via INTRATHECAL

## 2016-04-27 MED ORDER — MIDAZOLAM HCL 2 MG/2ML IJ SOLN
INTRAMUSCULAR | Status: AC
  Filled 2016-04-27: qty 2

## 2016-04-27 MED ORDER — SENNA 8.6 MG PO TABS
2.0000 | ORAL_TABLET | Freq: Every day | ORAL | Status: DC
  Administered 2016-04-27: 17.2 mg via ORAL
  Filled 2016-04-27: qty 2

## 2016-04-27 MED ORDER — METHOCARBAMOL 500 MG PO TABS
500.0000 mg | ORAL_TABLET | Freq: Four times a day (QID) | ORAL | Status: DC | PRN
  Administered 2016-04-27: 500 mg via ORAL
  Filled 2016-04-27: qty 1

## 2016-04-27 MED ORDER — KETOROLAC TROMETHAMINE 30 MG/ML IJ SOLN
INTRAMUSCULAR | Status: DC | PRN
Start: 2016-04-27 — End: 2016-04-27
  Administered 2016-04-27: 30 mg

## 2016-04-27 MED ORDER — DIPHENHYDRAMINE HCL 12.5 MG/5ML PO ELIX: 12.5000 mg | ORAL_SOLUTION | ORAL | Status: DC | PRN

## 2016-04-27 SURGICAL SUPPLY — 50 items
ALCOHOL ISOPROPYL (RUBBING) (MISCELLANEOUS) ×2 IMPLANT
BLADE SURG ROTATE 9660 (MISCELLANEOUS) ×2 IMPLANT
CAPT HIP TOTAL 2 ×2 IMPLANT
CHLORAPREP W/TINT 26ML (MISCELLANEOUS) ×2 IMPLANT
COVER SURGICAL LIGHT HANDLE (MISCELLANEOUS) ×2 IMPLANT
DERMABOND ADVANCED (GAUZE/BANDAGES/DRESSINGS) ×2
DERMABOND ADVANCED .7 DNX12 (GAUZE/BANDAGES/DRESSINGS) ×2 IMPLANT
DRAPE C-ARM 42X72 X-RAY (DRAPES) ×2 IMPLANT
DRAPE IMP U-DRAPE 54X76 (DRAPES) ×4 IMPLANT
DRAPE STERI IOBAN 125X83 (DRAPES) ×2 IMPLANT
DRAPE U-SHAPE 47X51 STRL (DRAPES) ×4 IMPLANT
DRSG AQUACEL AG ADV 3.5X10 (GAUZE/BANDAGES/DRESSINGS) ×2 IMPLANT
ELECT BLADE 4.0 EZ CLEAN MEGAD (MISCELLANEOUS) ×2
ELECT REM PT RETURN 9FT ADLT (ELECTROSURGICAL) ×2
ELECTRODE BLDE 4.0 EZ CLN MEGD (MISCELLANEOUS) ×1 IMPLANT
ELECTRODE REM PT RTRN 9FT ADLT (ELECTROSURGICAL) ×1 IMPLANT
GLOVE BIO SURGEON STRL SZ8.5 (GLOVE) ×4 IMPLANT
GLOVE BIOGEL PI IND STRL 8.5 (GLOVE) ×1 IMPLANT
GLOVE BIOGEL PI INDICATOR 8.5 (GLOVE) ×1
GOWN STRL REUS W/ TWL LRG LVL3 (GOWN DISPOSABLE) ×2 IMPLANT
GOWN STRL REUS W/TWL 2XL LVL3 (GOWN DISPOSABLE) ×4 IMPLANT
GOWN STRL REUS W/TWL LRG LVL3 (GOWN DISPOSABLE) ×2
HANDPIECE INTERPULSE COAX TIP (DISPOSABLE) ×1
HOOD PEEL AWAY FACE SHEILD DIS (HOOD) ×4 IMPLANT
KIT BASIN OR (CUSTOM PROCEDURE TRAY) ×2 IMPLANT
KIT ROOM TURNOVER OR (KITS) ×2 IMPLANT
MANIFOLD NEPTUNE II (INSTRUMENTS) ×2 IMPLANT
MARKER SKIN DUAL TIP RULER LAB (MISCELLANEOUS) ×2 IMPLANT
NEEDLE SPNL 18GX3.5 QUINCKE PK (NEEDLE) ×2 IMPLANT
NS IRRIG 1000ML POUR BTL (IV SOLUTION) ×2 IMPLANT
PACK TOTAL JOINT (CUSTOM PROCEDURE TRAY) ×2 IMPLANT
PACK UNIVERSAL I (CUSTOM PROCEDURE TRAY) ×2 IMPLANT
PAD ARMBOARD 7.5X6 YLW CONV (MISCELLANEOUS) ×2 IMPLANT
SAW OSC TIP CART 19.5X105X1.3 (SAW) ×2 IMPLANT
SEALER BIPOLAR AQUA 6.0 (INSTRUMENTS) ×2 IMPLANT
SET HNDPC FAN SPRY TIP SCT (DISPOSABLE) ×1 IMPLANT
SOLUTION BETADINE 4OZ (MISCELLANEOUS) ×2 IMPLANT
SUCTION FRAZIER HANDLE 10FR (MISCELLANEOUS) ×1
SUCTION TUBE FRAZIER 10FR DISP (MISCELLANEOUS) ×1 IMPLANT
SUT ETHIBOND NAB CT1 #1 30IN (SUTURE) ×2 IMPLANT
SUT MNCRL AB 3-0 PS2 18 (SUTURE) ×2 IMPLANT
SUT MON AB 2-0 CT1 36 (SUTURE) ×2 IMPLANT
SUT VIC AB 1 CT1 27 (SUTURE) ×1
SUT VIC AB 1 CT1 27XBRD ANBCTR (SUTURE) ×1 IMPLANT
SUT VIC AB 2-0 CT1 27 (SUTURE) ×1
SUT VIC AB 2-0 CT1 TAPERPNT 27 (SUTURE) ×1 IMPLANT
SUT VLOC 180 0 24IN GS25 (SUTURE) ×2 IMPLANT
SYR 50ML LL SCALE MARK (SYRINGE) ×2 IMPLANT
TOWEL OR 17X24 6PK STRL BLUE (TOWEL DISPOSABLE) ×2 IMPLANT
TOWEL OR 17X26 10 PK STRL BLUE (TOWEL DISPOSABLE) ×2 IMPLANT

## 2016-04-27 NOTE — Transfer of Care (Signed)
Immediate Anesthesia Transfer of Care Note  Patient: ANTRELL ABRAHA  Procedure(s) Performed: Procedure(s) with comments: LEFT TOTAL HIP ARTHROPLASTY ANTERIOR APPROACH (Left) - Needs RNFA  Patient Location: PACU  Anesthesia Type:General and Spinal  Level of Consciousness: awake, alert , oriented, pateint uncooperative and responds to stimulation  Airway & Oxygen Therapy: Patient Spontanous Breathing and Patient connected to nasal cannula oxygen  Post-op Assessment: Report given to RN, Post -op Vital signs reviewed and stable, Patient moving all extremities and Patient moving all extremities X 4  Post vital signs: Reviewed and stable  Last Vitals:  Vitals:   04/27/16 0615 04/27/16 1015  BP: 126/82   Pulse: 69   Resp: 20   Temp: 36.4 C (P) 36.3 C    Last Pain:  Vitals:   04/27/16 0650  TempSrc:   PainSc: 5       Patients Stated Pain Goal: 2 ([id number])  Complications: No apparent anesthesia complications

## 2016-04-27 NOTE — Op Note (Signed)
OPERATIVE REPORT  SURGEON: Rod Can, MD   ASSISTANT: Ky Barban, RNFA.  PREOPERATIVE DIAGNOSIS: Left hip arthritis.   POSTOPERATIVE DIAGNOSIS: Left hip arthritis.   PROCEDURE: Left total hip arthroplasty, anterior approach.   IMPLANTS: DePuy Tri Lock stem, size 6, hi offset. DePuy Pinnacle Cup, size 56 mm. DePuy Altrx liner, size 36 by 56 mm, neutral. DePuy Biolox ceramic head ball, size 36 + 5 mm.  ANESTHESIA:  General and Spinal  ESTIMATED BLOOD LOSS: 300 mL.  ANTIBIOTICS: 2 g Ancef.  DRAINS: None.  COMPLICATIONS: None.   CONDITION: PACU - hemodynamically stable.  BRIEF CLINICAL NOTE: Jerome Shaffer is a 60 y.o. male with a long-standing history of Left hip arthritis. After failing conservative management, the patient was indicated for total hip arthroplasty. The risks, benefits, and alternatives to the procedure were explained, and the patient elected to proceed.  PROCEDURE IN DETAIL: Surgical site was marked by myself. Once inside the operative room, spinal anesthesia was obtained, and a foley catheter was inserted. The patient was then positioned on the Hana table. All bony prominences were well padded. The hip was prepped and draped in the normal sterile surgical fashion. A time-out was called verifying side and site of surgery. The patient received IV antibiotics within 60 minutes of beginning the procedure.  The direct anterior approach to the hip was performed through the Hueter interval. The spinal was not provide adequate, and therefore general anesthesia was obtained.  Lateral femoral circumflex vessels were treated with the Auqumantys. The anterior capsule was exposed and an inverted T capsulotomy was made.The femoral neck cut was made to the level of the templated cut. A corkscrew was placed into the head and the head was removed. The femoral head was found to have eburnated bone. The head was passed to the back table and was  measured.  Acetabular exposure was achieved, and the pulvinar and labrum were excised. Sequental reaming of the acetabulum was then performed up to a size 55 mm reamer. A 56 mm cup was then opened and impacted into place at approximately 40 degrees of abduction and 20 degrees of anteversion. The final polyethylene liner was impacted into place and acetabular osteophytes were removed.   I then gained femoral exposure taking care to protect the abductors and greater trochanter. This was performed using standard external rotation, extension, and adduction. The capsule was peeled off the inner aspect of the greater trochanter, taking care to preserve the short external rotators. A cookie cutter was used to enter the femoral canal, and then the femoral canal finder was placed. Sequential broaching was performed up to a size 6. Calcar planer was used on the femoral neck remnant. I placed a hi offset neck and a trial head ball. The hip was reduced. Leg lengths and offset were checked fluoroscopically. The hip was dislocated and trial components were removed. The final implants were placed, and the hip was reduced.  Fluoroscopy was used to confirm component position and leg lengths. At 90 degrees of external rotation and full extension, the hip was stable to an anterior directed force.  The wound was copiously irrigated with a dilute betadine solution followed by normal saline. Marcaine solution was injected into the periarticular soft tissue. The wound was closed in layers using #1 Vicryl and V-Loc for the fascia, 2-0 Vicryl for the subcutaneous fat, 2-0 Monocryl for the deep dermal layer, 3-0 running Monocryl subcuticular stitch, and Dermabond for the skin. Once the glue was fully dried, an Aquacell Ag  dressing was applied. The patient was transported to the recovery room in stable condition. Sponge, needle, and instrument counts were correct at the end of the case x2. The patient tolerated the  procedure well and there were no known complications.

## 2016-04-27 NOTE — Discharge Instructions (Signed)
°Dr. Teauna Dubach °Joint Replacement Specialist °[facility] °[address] °[place], [place] [address] °[phone] ° ° °TOTAL HIP REPLACEMENT POSTOPERATIVE DIRECTIONS ° ° ° °Hip Rehabilitation, Guidelines Following Surgery  ° °WEIGHT BEARING °Weight bearing as tolerated with assist device (walker, cane, etc) as directed, use it as long as suggested by your surgeon or therapist, typically at least 4-6 weeks. ° °The results of a hip operation are greatly improved after range of motion and muscle strengthening exercises. Follow all safety measures which are given to protect your hip. If any of these exercises cause increased pain or swelling in your joint, decrease the amount until you are comfortable again. Then slowly increase the exercises. Call your caregiver if you have problems or questions.  ° °HOME CARE INSTRUCTIONS  °Most of the following instructions are designed to prevent the dislocation of your new hip.  °Remove items at home which could result in a fall. This includes throw rugs or furniture in walking pathways.  °Continue medications as instructed at time of discharge. °· You may have some home medications which will be placed on hold until you complete the course of blood thinner medication. °· You may start showering once you are discharged home. Do not remove your dressing. °Do not put on socks or shoes without following the instructions of your caregivers.   °Sit on chairs with arms. Use the chair arms to help push yourself up when arising.  °Arrange for the use of a toilet seat elevator so you are not sitting low.  °· Walk with walker as instructed.  °You may resume a sexual relationship in one month or when given the OK by your caregiver.  °Use walker as long as suggested by your caregivers.  °You may put full weight on your legs and walk as much as is comfortable. °Avoid periods of inactivity such as sitting longer than an hour when not asleep. This helps prevent  blood clots.  °You may return to work once you are cleared by your surgeon.  °Do not drive a car for 6 weeks or until released by your surgeon.  °Do not drive while taking narcotics.  °Wear elastic stockings for two weeks following surgery during the day but you may remove then at night.  °Make sure you keep all of your appointments after your operation with all of your doctors and caregivers. You should call the office at the above phone number and make an appointment for approximately two weeks after the date of your surgery. °Please pick up a stool softener and laxative for home use as long as you are requiring pain medications. °· ICE to the affected hip every three hours for 30 minutes at a time and then as needed for pain and swelling. Continue to use ice on the hip for pain and swelling from surgery. You may notice swelling that will progress down to the foot and ankle.  This is normal after surgery.  Elevate the leg when you are not up walking on it.   °It is important for you to complete the blood thinner medication as prescribed by your doctor. °· Continue to use the breathing machine which will help keep your temperature down.  It is common for your temperature to cycle up and down following surgery, especially at night when you are not up moving around and exerting yourself.  The breathing machine keeps your lungs expanded and your temperature down. ° °RANGE OF MOTION AND STRENGTHENING EXERCISES  °These exercises are   designed to help you keep full movement of your hip joint. Follow your caregiver's or physical therapist's instructions. Perform all exercises about fifteen times, three times per day or as directed. Exercise both hips, even if you have had only one joint replacement. These exercises can be done on a training (exercise) mat, on the floor, on a table or on a bed. Use whatever works the best and is most comfortable for you. Use music or television while you are exercising so that the exercises  are a pleasant break in your day. This will make your life better with the exercises acting as a break in routine you can look forward to.  °Lying on your back, slowly slide your foot toward your buttocks, raising your knee up off the floor. Then slowly slide your foot back down until your leg is straight again.  °Lying on your back spread your legs as far apart as you can without causing discomfort.  °Lying on your side, raise your upper leg and foot straight up from the floor as far as is comfortable. Slowly lower the leg and repeat.  °Lying on your back, tighten up the muscle in the front of your thigh (quadriceps muscles). You can do this by keeping your leg straight and trying to raise your heel off the floor. This helps strengthen the largest muscle supporting your knee.  °Lying on your back, tighten up the muscles of your buttocks both with the legs straight and with the knee bent at a comfortable angle while keeping your heel on the floor.  ° °SKILLED REHAB INSTRUCTIONS: °If the patient is transferred to a skilled rehab facility following release from the hospital, a list of the current medications will be sent to the facility for the patient to continue.  When discharged from the skilled rehab facility, please have the facility set up the patient's Home Health Physical Therapy prior to being released. Also, the skilled facility will be responsible for providing the patient with their medications at time of release from the facility to include their pain medication and their blood thinner medication. If the patient is still at the rehab facility at time of the two week follow up appointment, the skilled rehab facility will also need to assist the patient in arranging follow up appointment in our office and any transportation needs. ° °MAKE SURE YOU:  °Understand these instructions.  °Will watch your condition.  °Will get help right away if you are not doing well or get worse. ° °Pick up stool softner and  laxative for home use following surgery while on pain medications. °Do not remove your dressing. °The dressing is waterproof--it is OK to take showers. °Continue to use ice for pain and swelling after surgery. °Do not use any lotions or creams on the incision until instructed by your surgeon. °Total Hip Protocol. ° ° °

## 2016-04-27 NOTE — Anesthesia Procedure Notes (Signed)
Procedure Name: MAC Date/Time: 04/27/2016 7:27 AM Performed by: Jacquiline Doe A Pre-anesthesia Checklist: Patient identified, Emergency Drugs available, Suction available and Patient being monitored Patient Re-evaluated:Patient Re-evaluated prior to inductionOxygen Delivery Method: Nasal cannula Intubation Type: IV induction Placement Confirmation: positive ETCO2 Dental Injury: Teeth and Oropharynx as per pre-operative assessment

## 2016-04-27 NOTE — H&P (View-Only) (Signed)
TOTAL HIP ADMISSION H&P  Patient is admitted for left total hip arthroplasty.  Subjective:  Chief Complaint: left hip pain  HPI: Jerome Shaffer, 60 y.o. male, has a history of pain and functional disability in the left hip(s) due to arthritis and patient has failed non-surgical conservative treatments for greater than 12 weeks to include NSAID's and/or analgesics, flexibility and strengthening excercises, use of assistive devices and activity modification.  Onset of symptoms was gradual starting 2 years ago with rapidlly worsening course since that time.The patient noted no past surgery on the left hip(s).  Patient currently rates pain in the left hip at 10 out of 10 with activity. Patient has night pain, worsening of pain with activity and weight bearing, pain that interfers with activities of daily living and pain with passive range of motion. Patient has evidence of subchondral cysts, subchondral sclerosis, periarticular osteophytes and joint space narrowing by imaging studies. This condition presents safety issues increasing the risk of falls.  There is no current active infection.  There are no active problems to display for this patient.  Past Medical History:  Diagnosis Date  . Arthritis   . Hypertension   . TIA (transient ischemic attack)     Past Surgical History:  Procedure Laterality Date  . APPENDECTOMY    . COLONOSCOPY    . ESOPHAGOGASTRODUODENOSCOPY    . EYE SURGERY     lasic, retina tear repair  . vasctectomy       (Not in a hospital admission) No Known Allergies  Social History  Substance Use Topics  . Smoking status: Never Smoker  . Smokeless tobacco: Never Used  . Alcohol use Yes     Comment: occasional    No family history on file.   Review of Systems  Constitutional: Negative.   HENT: Negative.   Eyes: Negative.   Respiratory: Negative.   Cardiovascular: Negative.   Gastrointestinal: Negative.   Genitourinary: Negative.   Musculoskeletal:  Positive for joint pain.  Skin: Negative.   Neurological: Negative.   Endo/Heme/Allergies: Negative.   Psychiatric/Behavioral: Negative.     Objective:  Physical Exam  Vitals reviewed. Constitutional: He is oriented to person, place, and time. He appears well-developed and well-nourished.  HENT:  Head: Normocephalic and atraumatic.  Eyes: Conjunctivae and EOM are normal. Pupils are equal, round, and reactive to light.  Neck: Normal range of motion. Neck supple.  Cardiovascular: Normal rate, regular rhythm and intact distal pulses.   Respiratory: Effort normal. No respiratory distress.  GI: Soft. He exhibits no distension.  Genitourinary:  Genitourinary Comments: deferred  Musculoskeletal:       Left hip: He exhibits decreased range of motion and tenderness.  Neurological: He is alert and oriented to person, place, and time. He has normal reflexes.  Skin: Skin is warm and dry.  Psychiatric: He has a normal mood and affect. His behavior is normal. Judgment and thought content normal.    Vital signs in last 24 hours: @VSRANGES @  Labs:   Estimated body mass index is 25.76 kg/m as calculated from the following:   Height as of an earlier encounter on 04/22/16: 5' 5.5" (1.664 m).   Weight as of an earlier encounter on 04/22/16: 71.3 kg (157 lb 3 oz).   Imaging Review Plain radiographs demonstrate severe degenerative joint disease of the left hip(s). The bone quality appears to be adequate for age and reported activity level.  Assessment/Plan:  End stage arthritis, left hip(s)  The patient history, physical examination, clinical judgement  of the provider and imaging studies are consistent with end stage degenerative joint disease of the left hip(s) and total hip arthroplasty is deemed medically necessary. The treatment options including medical management, injection therapy, arthroscopy and arthroplasty were discussed at length. The risks and benefits of total hip arthroplasty  were presented and reviewed. The risks due to aseptic loosening, infection, stiffness, dislocation/subluxation,  thromboembolic complications and other imponderables were discussed.  The patient acknowledged the explanation, agreed to proceed with the plan and consent was signed. Patient is being admitted for inpatient treatment for surgery, pain control, PT, OT, prophylactic antibiotics, VTE prophylaxis, progressive ambulation and ADL's and discharge planning.The patient is planning to be discharged home with home health services. (+) TXA, apixaban.

## 2016-04-27 NOTE — Interval H&P Note (Signed)
History and Physical Interval Note:  04/27/2016 7:21 AM  Jerome Shaffer  has presented today for surgery, with the diagnosis of DJD Left Hip  The various methods of treatment have been discussed with the patient and family. After consideration of risks, benefits and other options for treatment, the patient has consented to  Procedure(s) with comments: LEFT TOTAL HIP ARTHROPLASTY ANTERIOR APPROACH (Left) - Needs RNFA as a surgical intervention .  The patient's history has been reviewed, patient examined, no change in status, stable for surgery.  I have reviewed the patient's chart and labs.  Questions were answered to the patient's satisfaction.     Caralina Nop, Horald Pollen

## 2016-04-27 NOTE — Anesthesia Procedure Notes (Signed)
Procedure Name: LMA Insertion Date/Time: 04/27/2016 8:10 AM Performed by: Jacquiline Doe A Pre-anesthesia Checklist: Patient identified, Emergency Drugs available, Suction available and Patient being monitored Patient Re-evaluated:Patient Re-evaluated prior to inductionOxygen Delivery Method: Circle System Utilized and Circle system utilized Preoxygenation: Pre-oxygenation with 100% oxygen Intubation Type: IV induction Ventilation: Mask ventilation without difficulty LMA: LMA inserted LMA Size: 4.0 Tube type: Oral Number of attempts: 1 Airway Equipment and Method: Bite block Placement Confirmation: positive ETCO2 Tube secured with: Tape Dental Injury: Teeth and Oropharynx as per pre-operative assessment

## 2016-04-27 NOTE — Anesthesia Postprocedure Evaluation (Signed)
Anesthesia Post Note  Patient: Jerome Shaffer  Procedure(s) Performed: Procedure(s) (LRB): LEFT TOTAL HIP ARTHROPLASTY ANTERIOR APPROACH (Left)  Patient location during evaluation: PACU Anesthesia Type: General and Spinal Level of consciousness: awake and alert Pain management: pain level controlled Vital Signs Assessment: post-procedure vital signs reviewed and stable Respiratory status: spontaneous breathing, nonlabored ventilation, respiratory function stable and patient connected to nasal cannula oxygen Cardiovascular status: blood pressure returned to baseline and stable Postop Assessment: no signs of nausea or vomiting Anesthetic complications: no    Last Vitals:  Vitals:   04/27/16 1134 04/27/16 1238  BP:  125/72  Pulse:  72  Resp:  12  Temp: 36.4 C 36.1 C    Last Pain:  Vitals:   04/27/16 1238  TempSrc: Oral  PainSc:                  [name]

## 2016-04-27 NOTE — Anesthesia Procedure Notes (Addendum)
Spinal  Staffing Anesthesiologist: Suzette Battiest Performed: anesthesiologist  Preanesthetic Checklist Completed: patient identified, site marked, surgical consent, pre-op evaluation, timeout performed, IV checked, risks and benefits discussed and monitors and equipment checked Spinal Block Patient position: sitting Prep: site prepped and draped and DuraPrep Patient monitoring: heart rate, continuous pulse ox and blood pressure Approach: midline Location: L4-5 Injection technique: single-shot Needle Needle type: Pencan  Needle gauge: 24 G Needle length: 9 cm Assessment Events: failed spinal Additional Notes CSF flow before and after injection LA. Pt moving with incision. Converted to [place] with LMA.

## 2016-04-27 NOTE — Discharge Summary (Signed)
Physician Discharge Summary  Patient ID: KANARD FORS MRN: [id number] DOB/AGE: July 12, 1955 60 y.o.  Admit date: 04/27/2016 Discharge date: 04/28/2016  Admission Diagnoses:  Primary osteoarthritis of left hip  Discharge Diagnoses:  Principal Problem:   Primary osteoarthritis of left hip   Past Medical History:  Diagnosis Date  . Arthritis   . Hypertension   . TIA (transient ischemic attack)     Surgeries: Procedure(s): LEFT TOTAL HIP ARTHROPLASTY ANTERIOR APPROACH on 04/27/2016   Consultants (if any):   Discharged Condition: Improved  Hospital Course: Jerome Shaffer is an 60 y.o. male who was admitted 04/27/2016 with a diagnosis of Primary osteoarthritis of left hip and went to the operating room on 04/27/2016 and underwent the above named procedures.    He was given perioperative antibiotics:  Anti-infectives    Start     Dose/Rate Route Frequency Ordered Stop   04/27/16 1400  ceFAZolin (ANCEF) IVPB 1 g/50 mL premix     1 g 100 mL/hr over 30 Minutes Intravenous Every 6 hours 04/27/16 1146 04/27/16 2239   04/27/16 0700  ceFAZolin (ANCEF) IVPB 2g/100 mL premix     2 g 200 mL/hr over 30 Minutes Intravenous On call to O.R. 04/27/16 [id number] 04/27/16 0750    .  He was given sequential compression devices, early ambulation, and ASA for DVT prophylaxis.  He benefited maximally from the hospital stay and there were no complications.    Recent vital signs:  Vitals:   04/28/16 0050 04/28/16 0700  BP: 122/66 (!) 106/55  Pulse: 77 75  Resp: 18 18  Temp: 98 F (36.7 C) 98 F (36.7 C)    Recent laboratory studies:  Lab Results  Component Value Date   HGB 14.1 04/22/2016   Lab Results  Component Value Date   WBC 7.7 04/22/2016   PLT 243 04/22/2016   No results found for: INR Lab Results  Component Value Date   NA 137 04/28/2016   K 4.3 04/28/2016   CL 108 04/28/2016   CO2 24 04/28/2016   BUN 17 04/28/2016   CREATININE 0.92 04/28/2016   GLUCOSE 121 (H)  04/28/2016    Discharge Medications:     Medication List    TAKE these medications   ADVIL PM 200-25 MG Caps Generic drug:  Ibuprofen-Diphenhydramine HCl Take 1-2 tablets by mouth at bedtime as needed (for pain/sleep.).   amLODipine 5 MG tablet Commonly known as:  NORVASC Take 5 mg by mouth daily.   aspirin 81 MG chewable tablet Chew 1 tablet (81 mg total) by mouth 2 (two) times daily. What changed:  when to take this  Another medication with the same name was removed. Continue taking this medication, and follow the directions you see here.   atorvastatin 40 MG tablet Commonly known as:  LIPITOR Take 40 mg by mouth daily.   CoQ10 200 MG Caps Take 200 mg by mouth daily.   docusate sodium 100 MG capsule Commonly known as:  COLACE Take 1 capsule (100 mg total) by mouth 2 (two) times daily.   Fish Oil 1200 MG Caps Take 1,200 mg by mouth daily.   Flaxseed Oil 1000 MG Caps Take 1,000 mg by mouth daily.   HYDROcodone-acetaminophen 5-325 MG tablet Commonly known as:  NORCO/VICODIN Take 1-2 tablets by mouth every 4 (four) hours as needed (breakthrough pain).   methocarbamol 500 MG tablet Commonly known as:  ROBAXIN Take 1 tablet (500 mg total) by mouth every 6 (six) hours as needed for muscle  spasms.   multivitamin with minerals Tabs tablet Take 1 tablet by mouth daily. Multivitamin for 50+   naproxen sodium 220 MG tablet Commonly known as:  ANAPROX Take 440 mg by mouth 2 (two) times daily as needed (for pain.).   ondansetron 4 MG tablet Commonly known as:  ZOFRAN Take 1 tablet (4 mg total) by mouth every 6 (six) hours as needed for nausea.   senna 8.6 MG Tabs tablet Commonly known as:  SENOKOT Take 2 tablets (17.2 mg total) by mouth at bedtime.   vitamin C 1000 MG tablet Take 1,000 mg by mouth daily.   VITAMIN E PO Take 1 capsule by mouth daily.       Diagnostic Studies: Dg Pelvis Portable  Result Date: 04/27/2016 CLINICAL DATA:  Status post left  hip replacement EXAM: PORTABLE PELVIS 1-2 VIEWS COMPARISON:  Fluoroscopy from earlier today FINDINGS: A total left hip arthroplasty is well seated in the AP projection. No evidence of acute fracture. Advanced right hip osteoarthritis with bone-on-bone contact from superior lateral joint narrowing. Flattening of the superior right femoral head. IMPRESSION: No acute finding after left hip arthroplasty. Electronically Signed   By: Monte Fantasia M.D.   On: 04/27/2016 13:33   Dg C-arm 61-120 Min  Result Date: 04/27/2016 CLINICAL DATA:  Arthroplasty. EXAM: DG C-ARM 61-120 MIN; OPERATIVE LEFT HIP WITH PELVIS COMPARISON:  No prior . FINDINGS: Total left hip replacement. Hardware intact. Normal alignment . No acute bony abnormality. 0 minutes 46 seconds fluoroscopy time. Two images. IMPRESSION: Total left hip replacement. Electronically Signed   By: Marcello Moores  Register   On: 04/27/2016 09:50   Dg Hip Operative Unilat W Or W/o Pelvis Left  Result Date: 04/27/2016 CLINICAL DATA:  Arthroplasty. EXAM: DG C-ARM 61-120 MIN; OPERATIVE LEFT HIP WITH PELVIS COMPARISON:  No prior . FINDINGS: Total left hip replacement. Hardware intact. Normal alignment . No acute bony abnormality. 0 minutes 46 seconds fluoroscopy time. Two images. IMPRESSION: Total left hip replacement. Electronically Signed   By: Marcello Moores  Register   On: 04/27/2016 09:50    Disposition: Final discharge disposition not confirmed  Discharge Instructions    Call MD / Call 911    Complete by:  As directed    If you experience chest pain or shortness of breath, CALL 911 and be transported to the hospital emergency room.  If you develope a fever above 101 F, pus (white drainage) or increased drainage or redness at the wound, or calf pain, call your surgeon's office.   Constipation Prevention    Complete by:  As directed    Drink plenty of fluids.  Prune juice may be helpful.  You may use a stool softener, such as Colace (over the counter) 100 mg twice a  day.  Use MiraLax (over the counter) for constipation as needed.   Diet - low sodium heart healthy    Complete by:  As directed    Driving restrictions    Complete by:  As directed    No driving for 4 weeks   Increase activity slowly as tolerated    Complete by:  As directed    Lifting restrictions    Complete by:  As directed    No lifting for 6 weeks   TED hose    Complete by:  As directed    Use stockings (TED hose) for 2 weeks on both leg(s).  You may remove them at night for sleeping.      Follow-up Information  Varie Machamer, Horald Pollen, MD. Schedule an appointment as soon as possible for a visit in 2 weeks.   Specialty:  Orthopedic Surgery Why:  For wound re-check Contact information: [address] [place] [address] [phone]            Signed: Elie Goody 04/28/2016, 7:36 AM

## 2016-04-27 NOTE — Evaluation (Signed)
Physical Therapy Evaluation Patient Details Name: Jerome Shaffer MRN: [id number] DOB: 13-Jul-1955 Today's Date: 04/27/2016   History of Present Illness  60 y.o. male now s/p direct anterior THA Lt. PMH: TIA.   Clinical Impression  Pt is s/p Lt direct anterior THA resulting in the deficits listed below (see PT Problem List). Pt ambulating 50 ft with rw and good stability.  Pt will benefit from skilled PT to increase their independence and safety with mobility to allow discharge to home with family support.       Follow Up Recommendations Home health PT;Supervision for mobility/OOB    Equipment Recommendations  Rolling walker with 5" wheels    Recommendations for Other Services       Precautions / Restrictions Precautions Precautions: Fall Restrictions Weight Bearing Restrictions: Yes LLE Weight Bearing: Weight bearing as tolerated      Mobility  Bed Mobility Overal bed mobility: Needs Assistance Bed Mobility: Supine to Sit     Supine to sit: HOB elevated;Min guard        Transfers Overall transfer level: Needs assistance Equipment used: Rolling walker (2 wheeled) Transfers: Sit to/from Stand Sit to Stand: Min guard         General transfer comment: cues for hand placement  Ambulation/Gait Ambulation/Gait assistance: Min guard Ambulation Distance (Feet): 50 Feet Assistive device: Rolling walker (2 wheeled) Gait Pattern/deviations: Step-through pattern Gait velocity: decreased   General Gait Details: good stability  Stairs            Wheelchair Mobility    Modified Rankin (Stroke Patients Only)       Balance Overall balance assessment: Needs assistance Sitting-balance support: No upper extremity supported Sitting balance-Leahy Scale: Good     Standing balance support: Bilateral upper extremity supported Standing balance-Leahy Scale: Poor Standing balance comment: using rw                             Pertinent  Vitals/Pain Pain Assessment: No/denies pain    Home Living Family/patient expects to be discharged to:: Private residence Living Arrangements: Spouse/significant other Available Help at Discharge: Family;Available 24 hours/day Type of Home: House Home Access: Stairs to enter Entrance Stairs-Rails:  (front none, garage both) Entrance Stairs-Number of Steps:  (3 front or 5 garage) Home Layout: Two level Home Equipment: None Additional Comments: bedroom/bathroom on second level    Prior Function Level of Independence: Independent               Hand Dominance        Extremity/Trunk Assessment   Upper Extremity Assessment: Overall WFL for tasks assessed           Lower Extremity Assessment: LLE deficits/detail   LLE Deficits / Details: good quad activation     Communication   Communication: No difficulties  Cognition Arousal/Alertness: Awake/alert Behavior During Therapy: WFL for tasks assessed/performed Overall Cognitive Status: Within Functional Limits for tasks assessed                      General Comments      Exercises     Assessment/Plan    PT Assessment Patient needs continued PT services  PT Problem List Decreased strength;Decreased range of motion;Decreased activity tolerance;Decreased balance;Decreased mobility          PT Treatment Interventions DME instruction;Gait training;Stair training;Functional mobility training;Therapeutic activities;Therapeutic exercise;Patient/family education    PT Goals (Current goals can be found in the Care Plan  section)  Acute Rehab PT Goals Patient Stated Goal: be active PT Goal Formulation: With patient Time For Goal Achievement: 05/04/16 Potential to Achieve Goals: Good    Frequency 7X/week   Barriers to discharge        Co-evaluation               End of Session Equipment Utilized During Treatment: Gait belt Activity Tolerance: Patient tolerated treatment well Patient left: in  chair;with call bell/phone within reach;with family/visitor present Nurse Communication: Mobility status         Time: [id number] PT Time Calculation (min) (ACUTE ONLY): 23 min   Charges:   PT Evaluation $PT Eval Moderate Complexity: 1 Procedure PT Treatments $Gait Training: 8-22 mins   PT G Codes:        Cassell Clement, PT, CSCS Pager [phone] Office [phone]  04/27/2016, 3:06 PM

## 2016-04-28 ENCOUNTER — Encounter (HOSPITAL_COMMUNITY): Payer: Self-pay | Admitting: Orthopedic Surgery

## 2016-04-28 LAB — CBC
HCT: 31.5 % — ABNORMAL LOW (ref 39.0–52.0)
Hemoglobin: 10.9 g/dL — ABNORMAL LOW (ref 13.0–17.0)
MCH: 30.7 pg (ref 26.0–34.0)
MCHC: 34.6 g/dL (ref 30.0–36.0)
MCV: 88.7 fL (ref 78.0–100.0)
PLATELETS: 225 10*3/uL (ref 150–400)
RBC: 3.55 MIL/uL — AB (ref 4.22–5.81)
RDW: 14.1 % (ref 11.5–15.5)
WBC: 10.1 10*3/uL (ref 4.0–10.5)

## 2016-04-28 LAB — BASIC METABOLIC PANEL
Anion gap: 5 (ref 5–15)
BUN: 17 mg/dL (ref 6–20)
CALCIUM: 8.2 mg/dL — AB (ref 8.9–10.3)
CO2: 24 mmol/L (ref 22–32)
CREATININE: 0.92 mg/dL (ref 0.61–1.24)
Chloride: 108 mmol/L (ref 101–111)
GFR calc Af Amer: >60 mL/min (ref >60)
Glucose, Bld: 121 mg/dL — ABNORMAL HIGH (ref 65–99)
POTASSIUM: 4.3 mmol/L (ref 3.5–5.1)
SODIUM: 137 mmol/L (ref 135–145)

## 2016-04-28 MED ORDER — ONDANSETRON HCL 4 MG PO TABS: 4.0000 mg | ORAL_TABLET | Freq: Four times a day (QID) | ORAL | 0 refills | Status: DC | PRN

## 2016-04-28 MED ORDER — DOCUSATE SODIUM 100 MG PO CAPS: 100.0000 mg | ORAL_CAPSULE | Freq: Two times a day (BID) | ORAL | 0 refills | Status: DC

## 2016-04-28 MED ORDER — HYDROCODONE-ACETAMINOPHEN 5-325 MG PO TABS: 1.0000 | ORAL_TABLET | ORAL | 0 refills | Status: DC | PRN

## 2016-04-28 MED ORDER — SENNA 8.6 MG PO TABS: 2.0000 | ORAL_TABLET | Freq: Every day | ORAL | 0 refills | Status: DC

## 2016-04-28 MED ORDER — METHOCARBAMOL 500 MG PO TABS: 500.0000 mg | ORAL_TABLET | Freq: Four times a day (QID) | ORAL | 0 refills | Status: DC | PRN

## 2016-04-28 MED ORDER — ASPIRIN 81 MG PO CHEW: 81.0000 mg | CHEWABLE_TABLET | Freq: Two times a day (BID) | ORAL | 1 refills | Status: DC

## 2016-04-28 NOTE — Progress Notes (Addendum)
Physical Therapy Treatment Patient Details Name: Jerome Shaffer MRN: [id number] DOB: [birth year] Today's Date: 04/28/2016    History of Present Illness 59 y.o. male now s/p direct anterior THA Lt. PMH: TIA.     PT Comments    Pt making excellent progress with his mobility. Able to ambulate 400 ft with rw and supervision. Will review stairs prior to D/C. Anticipate pt will D/C to home with spouse to assist.   Follow Up Recommendations  Home health PT;Supervision for mobility/OOB     Equipment Recommendations  Rolling walker with 5" wheels    Recommendations for Other Services       Precautions / Restrictions Precautions Precautions: None Precaution Comments: HEP provided Restrictions Weight Bearing Restrictions: Yes LLE Weight Bearing: Weight bearing as tolerated    Mobility  Bed Mobility               General bed mobility comments: in chair upon arrival  Transfers Overall transfer level: Needs assistance Equipment used: Rolling walker (2 wheeled) Transfers: Sit to/from Stand Sit to Stand: Independent         General transfer comment: VC's for hand placement  Ambulation/Gait Ambulation/Gait assistance: Modified independent (Device/Increase time) Ambulation Distance (Feet): 200 Feet Assistive device: Rolling walker (2 wheeled) Gait Pattern/deviations: Step-through pattern Gait velocity: WFL   General Gait Details: evens strides, cues for posture   Stairs Stairs: Yes Stairs assistance: Min guard Stair Management: One rail Right;Step to pattern;Forwards Number of Stairs: 24 General stair comments: good stability, cues for sequence provided as needed.   Wheelchair Mobility    Modified Rankin (Stroke Patients Only)       Balance Overall balance assessment: Needs assistance Sitting-balance support: No upper extremity supported Sitting balance-Leahy Scale: Normal     Standing balance support: During functional activity Standing  balance-Leahy Scale: Fair Standing balance comment: Able to remove single UE for grooming tasks briefly at sink with supervision.                    Cognition Arousal/Alertness: Awake/alert Behavior During Therapy: WFL for tasks assessed/performed Overall Cognitive Status: Within Functional Limits for tasks assessed                      Exercises Total Joint Exercises Ankle Circles/Pumps: AROM;Both;10 reps Quad Sets: Strengthening;Left;10 reps Short Arc Quad: Strengthening;Left;5 reps Heel Slides: AAROM;Left;5 reps Hip ABduction/ADduction: Strengthening;Left;10 reps Long Arc Quad: Strengthening;Left;10 reps    General Comments        Pertinent Vitals/Pain Pain Assessment: No/denies pain    Home Living Family/patient expects to be discharged to:: Private residence Living Arrangements: Spouse/significant other Available Help at Discharge: Family;Available 24 hours/day Type of Home: House Home Access: Stairs to enter Entrance Stairs-Rails:  (Front none, garage both) Home Layout: Two level Home Equipment: None      Prior Function Level of Independence: Independent with assistive device(s)      Comments: Pt using sock aide and long handled shoe horn for LB dressing prior to surgery.   PT Goals (current goals can now be found in the care plan section) Acute Rehab PT Goals Patient Stated Goal: be active PT Goal Formulation: With patient Time For Goal Achievement: 05/04/16 Potential to Achieve Goals: Good Progress towards PT goals: Progressing toward goals    Frequency    7X/week      PT Plan Current plan remains appropriate    Co-evaluation  End of Session Equipment Utilized During Treatment: Gait belt Activity Tolerance: Patient tolerated treatment well Patient left: in chair;with call bell/phone within reach;with family/visitor present     Time: [id number] PT Time Calculation (min) (ACUTE ONLY): 30 min  Charges:  $Gait  Training: 8-22 mins $Therapeutic Exercise: 8-22 mins                    G Codes:      Cassell Clement, PT, CSCS Pager [phone] Office [phone]  04/28/2016, 12:38 PM

## 2016-04-28 NOTE — Progress Notes (Signed)
Physical Therapy Progress Note  Patient is making good progress with PT.  From a mobility standpoint anticipate patient will be ready for DC home with spouse assistance. Pt/spouse deny any questions or concerns.     04/28/16 1230  PT Visit Information  Last PT Received On 04/28/16  Assistance Needed +1  History of Present Illness 60 y.o. male now s/p direct anterior THA Lt. PMH: TIA.   Subjective Data  Subjective Reports feeling great, no questions or concerns.   Patient Stated Goal be active  Precautions  Precautions None  Restrictions  Weight Bearing Restrictions Yes  LLE Weight Bearing WBAT  Pain Assessment  Pain Assessment No/denies pain  Cognition  Arousal/Alertness Awake/alert  Behavior During Therapy WFL for tasks assessed/performed  Overall Cognitive Status Within Functional Limits for tasks assessed  Bed Mobility  General bed mobility comments in chair upon arrival  Transfers  Overall transfer level Needs assistance  Equipment used Rolling walker (2 wheeled)  Transfers Sit to/from Stand  Sit to Stand Independent  Ambulation/Gait  Ambulation/Gait assistance Modified independent (Device/Increase time)  Ambulation Distance (Feet) 200 Feet  Assistive device Rolling walker (2 wheeled)  Gait Pattern/deviations Step-through pattern  General Gait Details evens strides, cues for posture  Gait velocity WFL  Stairs Yes  Stairs assistance Min guard  Stair Management One rail Right;Step to pattern;Forwards  Number of Stairs 24  General stair comments good stability, cues for sequence provided as needed.   Balance  Overall balance assessment Needs assistance  Sitting-balance support No upper extremity supported  Sitting balance-Leahy Scale Normal  Standing balance support During functional activity  Standing balance-Leahy Scale Fair  Exercises  Exercises Total Joint  Total Joint Exercises  Ankle Circles/Pumps AROM;Both;10 reps  [employer] Strengthening;Left;10 reps   Short Arc [employer] Strengthening;Left;5 reps  Heel Slides AAROM;Left;5 reps  Hip ABduction/ADduction Strengthening;Left;10 reps  PT - End of Session  Equipment Utilized During Treatment Gait belt  Activity Tolerance Patient tolerated treatment well  Patient left in chair;with call bell/phone within reach;with family/visitor present  Nurse Communication Mobility status  PT - Assessment/Plan  PT Plan Current plan remains appropriate  PT Frequency (ACUTE ONLY) 7X/week  Follow Up Recommendations Home health PT;Supervision for mobility/OOB  PT equipment Rolling walker with 5" wheels  PT Goal Progression  Progress towards PT goals Progressing toward goals  Acute Rehab PT Goals  PT Goal Formulation With patient  Time For Goal Achievement 05/04/16  Potential to Achieve Goals Good  PT Time Calculation  PT Start Time (ACUTE ONLY) 1147  PT Stop Time (ACUTE ONLY) 1217  PT Time Calculation (min) (ACUTE ONLY) 30 min  PT General Charges  $$ ACUTE PT VISIT 1 Procedure  PT Treatments  $Gait Training 8-22 mins  $Therapeutic Exercise 8-22 mins  Cassell Clement, PT, CSCS Pager [phone] Office [phone]

## 2016-04-28 NOTE — Evaluation (Signed)
Occupational Therapy Evaluation/Discharge Patient Details Name: Jerome Shaffer MRN: [id number] DOB: 17-Jun-1956 Today's Date: 04/28/2016    History of Present Illness 60 y.o. male now s/p direct anterior THA Lt. PMH: TIA.    Clinical Impression   PTA, pt was independent with AE for ADL and IADL. Pt currently requires supervision for LB ADL and min guard assit for shower transfer. All education complete concerning dressing techniques, safe use of DME, use of AE for LB ADL, and fall prevention. Pt verbalizes and demonstrates understanding. Recommend D/C home with 24 hour assistance/supervision from wife. Recommend 3-in-1 BSC for DME needs. Pt has no further acute OT needs and no OT follow-up recommended. OT signing off.    Follow Up Recommendations  No OT follow up;Supervision/Assistance - 24 hour    Equipment Recommendations  3 in 1 bedside comode       Precautions / Restrictions Precautions Precautions: None Precaution Comments: HEP provided Restrictions Weight Bearing Restrictions: Yes LLE Weight Bearing: Weight bearing as tolerated      Mobility Bed Mobility               General bed mobility comments: in chair upon arrival  Transfers Overall transfer level: Needs assistance Equipment used: Rolling walker (2 wheeled) Transfers: Sit to/from Stand Sit to Stand: Supervision         General transfer comment: VC's for hand placement    Balance Overall balance assessment: Needs assistance Sitting-balance support: No upper extremity supported;Feet supported Sitting balance-Leahy Scale: Normal     Standing balance support: During functional activity;Single extremity supported Standing balance-Leahy Scale: Fair Standing balance comment: Able to remove single UE for grooming tasks briefly at sink with supervision.                            ADL Overall ADL's : Needs assistance/impaired     Grooming: Supervision/safety;Oral care;Standing   Upper  Body Bathing: Set up;Sitting   Lower Body Bathing: Supervison/ safety;Sit to/from stand   Upper Body Dressing : Set up;Sitting   Lower Body Dressing: Supervision/safety;Sit to/from stand Lower Body Dressing Details (indicate cue type and reason): Pt previously used long handled shoe horn and sock aide for LB dressing. Toilet Transfer: Supervision/safety;Ambulation;BSC   Toileting- Water quality scientist and Hygiene: Supervision/safety;Sit to/from stand   Tub/ Banker: Cueing for sequencing;3 in 1;Rolling walker;Ambulation;Min guard   Functional mobility during ADLs: Supervision/safety;Rolling walker;Min guard (Supervision for functional ambulation, min guard for shower) General ADL Comments: Pt educated on dressing techniques, fall prevention, use of ice for pain management, safe use of 3-in-1 for shower transfer, and safe ADL transfers.     Vision Vision Assessment?: No apparent visual deficits          Pertinent Vitals/Pain Pain Assessment: No/denies pain     Hand Dominance Right   Extremity/Trunk Assessment Upper Extremity Assessment Upper Extremity Assessment: Overall WFL for tasks assessed   Lower Extremity Assessment Lower Extremity Assessment: LLE deficits/detail LLE Deficits / Details: Decreased ROM and strength post-operatively.       Communication Communication Communication: No difficulties   Cognition Arousal/Alertness: Awake/alert Behavior During Therapy: WFL for tasks assessed/performed Overall Cognitive Status: Within Functional Limits for tasks assessed                                Home Living Family/patient expects to be discharged to:: Private residence Living Arrangements: Spouse/significant other Available Help  at Discharge: Family;Available 24 hours/day Type of Home: House Home Access: Stairs to enter [employer] of Steps: 3 front, 5 garage Entrance Stairs-Rails:  (Front none, garage both) Home Layout: Two  level Alternate Level Stairs-Number of Steps: 15 Alternate Level Stairs-Rails: Right Bathroom Shower/Tub: Walk-in shower;Door   [employer] Toilet: Handicapped height Bathroom Accessibility: Yes How Accessible: Accessible via walker Home Equipment: None          Prior Functioning/Environment Level of Independence: Independent with assistive device(s)        Comments: Pt using sock aide and long handled shoe horn for LB dressing prior to surgery.        OT Problem List: Decreased strength;Decreased range of motion;Decreased activity tolerance;Impaired balance (sitting and/or standing);Decreased knowledge of use of DME or AE;Decreased safety awareness;Decreased knowledge of precautions;Pain   OT Treatment/Interventions:      OT Goals(Current goals can be found in the care plan section) Acute Rehab OT Goals Patient Stated Goal: be active OT Goal Formulation: With patient Time For Goal Achievement: 05/12/16 Potential to Achieve Goals: Good      End of Session Equipment Utilized During Treatment: Gait belt;Rolling walker Nurse Communication: Other (comment) (OT signing off)  Activity Tolerance: Patient tolerated treatment well Patient left: in chair;with call bell/phone within reach;with family/visitor present   Time: 1045-1102 OT Time Calculation (min): 17 min Charges:  OT General Charges $OT Visit: 1 Procedure OT Evaluation $OT Eval Moderate Complexity: 1 Procedure  Norman Herrlich, OTR/L [phone] 04/28/2016, 11:34 AM

## 2016-04-28 NOTE — Care Management Note (Signed)
Case Management Note  Patient Details  Name: Jerome Shaffer MRN: [id number] Date of Birth: [birth year]  Subjective/Objective:    60 yr old gentleman s/p left hip arthroplasty, anterior approach.                Action/Plan: Case manager spoke with patient concerning [place] and DME needs. Patient was preoperatively setup with [facility], no changes. CM has ordered RW and 3in1. Patient states he will have family support at home.   Expected Discharge Date:   04/28/16               Expected Discharge Plan:  [place]  In-House Referral:     Discharge planning Services  CM Consult  Post Acute Care Choice:  Durable Medical Equipment, Home Health Choice offered to:  Patient  DME Arranged:  3-N-1, Walker rolling DME Agency:  E. Lopez:  PT [place]:  [facility] (now [facility])  Status of Service:  Completed, signed off  If discussed at [employer] of Stay Meetings, dates discussed:    Additional Comments:  Ninfa Meeker, RN 04/28/2016, 11:19 AM

## 2016-04-28 NOTE — Progress Notes (Signed)
Pt ready for d/c home today per MD. Pain is controlled on PO pain medication, ambulating with standby assistance. Pt met PT/OT goals, equipment was delivered to room. Discharge instructions and prescriptions reviewed with pt and his wife, all questions answered. Belongings sent with pt's wife.  [place], Jerry Caras

## 2016-04-28 NOTE — Progress Notes (Signed)
   Subjective:  Patient reports pain as mild to moderate.  Walked in the hall with PT yesterday, Denies N/V/CP/SOB.  Objective:   VITALS:   Vitals:   04/27/16 1238 04/27/16 2120 04/28/16 0050 04/28/16 0700  BP: 125/72 116/60 122/66 (!) 106/55  Pulse: 72 81 77 75  Resp: 12 18 18 18   Temp: 97 F (36.1 C) 98.1 F (36.7 C) 98 F (36.7 C) 98 F (36.7 C)  TempSrc: Oral Oral Oral Oral  SpO2: 94% 100% 97% 99%  Weight:      Height:       NAD Neurologically intact ABD soft Sensation intact distally Intact pulses distally Dorsiflexion/Plantar flexion intact Incision: dressing C/D/I Compartment soft    Lab Results  Component Value Date   WBC 7.7 04/22/2016   HGB 14.1 04/22/2016   HCT 40.7 04/22/2016   MCV 87.9 04/22/2016   PLT 243 04/22/2016   BMET    Component Value Date/Time   NA 137 04/28/2016 0640   K 4.3 04/28/2016 0640   CL 108 04/28/2016 0640   CO2 24 04/28/2016 0640   GLUCOSE 121 (H) 04/28/2016 0640   BUN 17 04/28/2016 0640   CREATININE 0.92 04/28/2016 0640   CALCIUM 8.2 (L) 04/28/2016 0640   GFRNONAA >60 04/28/2016 0640   GFRAA >60 04/28/2016 0640     Assessment/Plan: 1 Day Post-Op   Principal Problem:   Primary osteoarthritis of left hip   WBAT with walker DVT ppx: ASA, SCDs, TEDs PO pain control PT/OT Dispo: am labs pending, d/c home likely today    Elie Goody 04/28/2016, 7:31 AM   Rod Can, MD Cell [phone]

## 2016-04-30 ENCOUNTER — Other Ambulatory Visit: Payer: Self-pay | Admitting: *Deleted

## 2016-04-30 NOTE — Patient Outreach (Addendum)
[facility]) Care Management  04/30/2016  Jerome Shaffer [birth year] [id number]   Subjective: Telephone call to patient's home number, spoke with patient, and HIPAA verified.   Discussed [facility] Care Management [employer] Transition of care follow up.    Patient voices understanding and is in agreement to complete follow up.   States he is currently getting ready to get on a call and will call RNCM back at a later time.     Objective: Per chart review: Patient hospitalized 04/27/16 -04/28/16 for Primary osteoarthritis of left hip.   Status post LEFT TOTAL HIP ARTHROPLASTY ANTERIOR APPROACH on 04/27/2016.   Patient also has a history of hypertension and TIA (transient ischemic attack).     Assessment: Received [employer] referral for transition of care follow up via [place] discharge list on 04/30/16.   Transition of care follow up pending, patient contact.   Plan: RNCM will call patient for 2nd telephonic outreach attempt, transition of care follow up, within 10 business days, if no return call.   Jillianne Gamino H. Annia Friendly, BSN, [place] Management [facility] Telephonic CM Phone: [phone] Fax: [phone]

## 2016-05-01 ENCOUNTER — Other Ambulatory Visit: Payer: Self-pay | Admitting: *Deleted

## 2016-05-01 ENCOUNTER — Ambulatory Visit: Payer: Self-pay | Admitting: *Deleted

## 2016-05-01 ENCOUNTER — Encounter: Payer: Self-pay | Admitting: *Deleted

## 2016-05-01 NOTE — Patient Outreach (Addendum)
[place] [facility]) Care Management  05/01/2016  Jerome Shaffer 1956/05/13 [id number]   Subjective: Telephone call to patient's home number, spoke with patient, and HIPAA verified.  Discussed [facility] Care Management [employer] Transition of care follow up, patient voiced understanding, and is in agreement to complete. Patient states he is doing well and will call primary MD's office to set up an appointment for follow up.   States MD is aware of his hospitalization.  He managing pain well with pain medication and does not have any issues with constipation.  Has home health physical therapy through [facility] and it is going well.   States the has the following durable medical equipment: bedside commode, rolling walker, straight cane, and blood pressure cuff.   Takes blood pressure once a week and home health physical therapist takes blood pressure at each home visit.  Patient states he is currently using vacation time to cover leave from work and will begin working from home next week.   RNCM educated patient on family medical leave act (FMLA), voices understanding, states he is currently able to manage his time off from work with vacation time, and does not need FMLA at this time.  States he does not have any supplemental insurance through his employer. Patient states he does not have any transition of care, care coordination, disease management, disease monitoring, transportation, community resource, or pharmacy needs at this time.  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   Objective: Per chart review: Patient hospitalized 04/27/16 -04/28/16 for Primary osteoarthritis of left hip.   Status post LEFT TOTAL HIP ARTHROPLASTY ANTERIOR APPROACH on10/23/2017.   Patient also has a history of hypertension and TIA (transient ischemic attack).      Assessment: Received [employer] referral for transition of care follow up via [place] discharge list on 04/30/16.   Transition of care follow up completed, no additional transition of care needs, and will proceed with case closure .   Plan: RNCM will send patient successful outreach letter, [facility] pamphlet, and magnet. RNCM will send case closure due to follow up completed / no care management needs request to [name].   Aldo Sondgeroth H. Annia Friendly, BSN, [place] Management [facility] Telephonic CM Phone: [phone] Fax: [phone]

## 2016-06-10 ENCOUNTER — Ambulatory Visit: Payer: Self-pay | Admitting: Orthopedic Surgery

## 2016-06-10 ENCOUNTER — Encounter (HOSPITAL_COMMUNITY)
Admission: RE | Admit: 2016-06-10 | Discharge: 2016-06-10 | Disposition: A | Discharge status: Home / Self Care | Payer: Managed Care, Other (non HMO) | Source: Ambulatory Visit | Attending: Orthopedic Surgery | Admitting: Orthopedic Surgery

## 2016-06-10 ENCOUNTER — Encounter (HOSPITAL_COMMUNITY): Payer: Self-pay

## 2016-06-10 DIAGNOSIS — M1611 Unilateral primary osteoarthritis, right hip: Secondary | ICD-10-CM | POA: Diagnosis not present

## 2016-06-10 DIAGNOSIS — Z01818 Encounter for other preprocedural examination: Secondary | ICD-10-CM | POA: Insufficient documentation

## 2016-06-10 HISTORY — DX: Cerebral infarction, unspecified: I63.9

## 2016-06-10 LAB — CBC
HEMATOCRIT: 38 % — AB (ref 39.0–52.0)
Hemoglobin: 13.1 g/dL (ref 13.0–17.0)
MCH: 30.1 pg (ref 26.0–34.0)
MCHC: 34.5 g/dL (ref 30.0–36.0)
MCV: 87.4 fL (ref 78.0–100.0)
Platelets: 270 10*3/uL (ref 150–400)
RBC: 4.35 MIL/uL (ref 4.22–5.81)
RDW: 14.2 % (ref 11.5–15.5)
WBC: 6.1 10*3/uL (ref 4.0–10.5)

## 2016-06-10 LAB — BASIC METABOLIC PANEL
Anion gap: 5 (ref 5–15)
BUN: 21 mg/dL — AB (ref 6–20)
CHLORIDE: 107 mmol/L (ref 101–111)
CO2: 29 mmol/L (ref 22–32)
Calcium: 9.4 mg/dL (ref 8.9–10.3)
Creatinine, Ser: 1.05 mg/dL (ref 0.61–1.24)
GFR calc non Af Amer: >60 mL/min (ref >60)
Glucose, Bld: 112 mg/dL — ABNORMAL HIGH (ref 65–99)
POTASSIUM: 3.9 mmol/L (ref 3.5–5.1)
SODIUM: 141 mmol/L (ref 135–145)

## 2016-06-10 LAB — SURGICAL PCR SCREEN
MRSA, PCR: NEGATIVE
STAPHYLOCOCCUS AUREUS: NEGATIVE

## 2016-06-10 LAB — TYPE AND SCREEN
ABO/RH(D): A NEG
Antibody Screen: NEGATIVE

## 2016-06-10 NOTE — Progress Notes (Signed)
PCP is Dr. Maury Dus Denies seeing a cardiologist. Request sent for echo report. Denies ever having a card cath or stress test. Ekg noted in epic from Oct 2017

## 2016-06-10 NOTE — Pre-Procedure Instructions (Signed)
MEDHANSH SPANGLER  06/10/2016      [facility] [place], [place] AT [facility] OF [place] [address] [place] [address] Phone: [phone] Fax: [phone]    Your procedure is scheduled on Dec. 11  Report to [place] at 530 A.M.  Call this number if you have problems the morning of surgery:  [phone]   Remember:  Do not eat food or drink liquids after midnight.  Take these medicines the morning of surgery with A SIP OF WATER amlodipine (Norvasc)  Stop taking aspirin, BC's, herbal medications, Fish Oil, Ibuprofen, Advil, Motrin, Aleve, Vitamins,Naproxen    Do not wear jewelry, make-up or nail polish.  Do not wear lotions, powders, or perfumes, or deoderant.  Do not shave 48 hours prior to surgery.  Men may shave face and neck.  Do not bring valuables to the hospital.  [facility] is not responsible for any belongings or valuables.  Contacts, dentures or bridgework may not be worn into surgery.  Leave your suitcase in the car.  After surgery it may be brought to your room.  For patients admitted to the hospital, discharge time will be determined by your treatment team.  Patients discharged the day of surgery will not be allowed to drive home.  Special instructions:  [facility] - Preparing for Surgery  Before surgery, you can play an important role.  Because skin is not sterile, your skin needs to be as free of germs as possible.  You can reduce the number of germs on you skin by washing with CHG (chlorahexidine gluconate) soap before surgery.  CHG is an antiseptic cleaner which kills germs and bonds with the skin to continue killing germs even after washing.  Please DO NOT use if you have an allergy to CHG or antibacterial soaps.  If your skin becomes reddened/irritated stop using the CHG and inform your nurse when you arrive at Short Stay.  Do not shave (including legs and underarms)  for at least 48 hours prior to the first CHG shower.  You may shave your face.  Please follow these instructions carefully:   1.  Shower with CHG Soap the night before surgery and the     morning of Surgery.  2.  If you choose to wash your hair, wash your hair first as usual with your    normal shampoo.  3.  After you shampoo, rinse your hair and body thoroughly to remove the Shampoo.  4.  Use CHG as you would any other liquid soap.  You can apply chg directly  to the skin and wash gently with scrungie or a clean washcloth.  5.  Apply the CHG Soap to your body ONLY FROM THE NECK DOWN.    Do not use on open wounds or open sores.  Avoid contact with your eyes,       ears, mouth and genitals (private parts).  Wash genitals (private parts) with your normal soap.  6.  Wash thoroughly, paying special attention to the area where your surgery will be performed.  7.  Thoroughly rinse your body with warm water from the neck down.  8.  DO NOT shower/wash with your normal soap after using and rinsing off   the CHG Soap.  9.  Pat yourself dry with a clean towel.            10.  Wear clean pajamas.  11.  Place clean sheets on your bed the night of your first shower and do not   sleep with pets.  Day of Surgery  Do not apply any lotions/deoderants the morning of surgery.  Please wear clean clothes to the hospital/surgery center.     Please read over the following fact sheets that you were given. Coughing and Deep Breathing, MRSA Information and Surgical Site Infection Prevention

## 2016-06-10 NOTE — H&P (Signed)
TOTAL HIP ADMISSION H&P  Patient is admitted for right total hip arthroplasty.  Subjective:  Chief Complaint: right hip pain  HPI: Jerome Shaffer, 60 y.o. male, has a history of pain and functional disability in the right hip(s) due to arthritis and patient has failed non-surgical conservative treatments for greater than 12 weeks to include NSAID's and/or analgesics, viscosupplementation injections, flexibility and strengthening excercises, use of assistive devices, weight reduction as appropriate and activity modification.  Onset of symptoms was gradual starting 1 years ago with gradually worsening course since that time.The patient noted no past surgery on the right hip(s).  Patient currently rates pain in the right hip at 10 out of 10 with activity. Patient has night pain, worsening of pain with activity and weight bearing, pain that interfers with activities of daily living and pain with passive range of motion. Patient has evidence of subchondral cysts, subchondral sclerosis, periarticular osteophytes and joint space narrowing by imaging studies. This condition presents safety issues increasing the risk of falls.  There is no current active infection.  Patient Active Problem List   Diagnosis Date Noted  . Primary osteoarthritis of left hip 04/27/2016   Past Medical History:  Diagnosis Date  . Arthritis   . Hypertension   . TIA (transient ischemic attack)     Past Surgical History:  Procedure Laterality Date  . APPENDECTOMY    . COLONOSCOPY    . ESOPHAGOGASTRODUODENOSCOPY    . EYE SURGERY     lasic, retina tear repair  . TOTAL HIP ARTHROPLASTY Left 04/27/2016   Procedure: LEFT TOTAL HIP ARTHROPLASTY ANTERIOR APPROACH;  Surgeon: Rod Can, MD;  Location: [facility];  Service: Orthopedics;  Laterality: Left;  Needs RNFA  . vasctectomy       (Not in a hospital admission) Allergies  Allergen Reactions  . No Known Allergies     Social History  Substance Use Topics  . Smoking  status: Never Smoker  . Smokeless tobacco: Never Used  . Alcohol use Yes     Comment: occasional    No family history on file.   Review of Systems  Constitutional: Negative.   HENT: Negative.   Eyes: Negative.   Respiratory: Negative.   Cardiovascular: Negative.   Gastrointestinal: Negative.   Genitourinary: Positive for frequency.  Musculoskeletal: Positive for back pain and joint pain.  Skin: Negative.   Neurological: Negative.   Endo/Heme/Allergies: Negative.   Psychiatric/Behavioral: Negative.     Objective:  Physical Exam  Vitals reviewed. Constitutional: He is oriented to person, place, and time. He appears well-developed.  HENT:  Head: Normocephalic and atraumatic.  Eyes: Conjunctivae and EOM are normal. Pupils are equal, round, and reactive to light.  Neck: Normal range of motion. Neck supple.  Cardiovascular: Normal rate, regular rhythm and intact distal pulses.   Respiratory: Effort normal. No respiratory distress.  GI: Soft. He exhibits no distension.  Genitourinary:  Genitourinary Comments: deferred  Musculoskeletal:       Right hip: He exhibits decreased range of motion and bony tenderness.       Legs: Neurological: He is alert and oriented to person, place, and time. He has normal reflexes.  Skin: Skin is warm and dry.  Psychiatric: He has a normal mood and affect. His behavior is normal. Judgment and thought content normal.    Vital signs in last 24 hours: @VSRANGES @  Labs:   Estimated body mass index is 25.73 kg/m as calculated from the following:   Height as of 04/27/16: 5' 5.5" (  1.664 m).   Weight as of 04/27/16: 71.2 kg (157 lb).   Imaging Review Plain radiographs demonstrate severe degenerative joint disease of the right hip(s). The bone quality appears to be adequate for age and reported activity level.  Assessment/Plan:  End stage arthritis, right hip(s)  The patient history, physical examination, clinical judgement of the provider  and imaging studies are consistent with end stage degenerative joint disease of the right hip(s) and total hip arthroplasty is deemed medically necessary. The treatment options including medical management, injection therapy, arthroscopy and arthroplasty were discussed at length. The risks and benefits of total hip arthroplasty were presented and reviewed. The risks due to aseptic loosening, infection, stiffness, dislocation/subluxation,  thromboembolic complications and other imponderables were discussed.  The patient acknowledged the explanation, agreed to proceed with the plan and consent was signed. Patient is being admitted for inpatient treatment for surgery, pain control, PT, OT, prophylactic antibiotics, VTE prophylaxis, progressive ambulation and ADL's and discharge planning.The patient is planning to be discharged home with home health services

## 2016-06-12 ENCOUNTER — Ambulatory Visit (INDEPENDENT_AMBULATORY_CARE_PROVIDER_SITE_OTHER): Payer: Managed Care, Other (non HMO) | Admitting: Ophthalmology

## 2016-06-12 DIAGNOSIS — H35033 Hypertensive retinopathy, bilateral: Secondary | ICD-10-CM

## 2016-06-12 DIAGNOSIS — H33302 Unspecified retinal break, left eye: Secondary | ICD-10-CM

## 2016-06-12 DIAGNOSIS — H43813 Vitreous degeneration, bilateral: Secondary | ICD-10-CM | POA: Diagnosis not present

## 2016-06-12 DIAGNOSIS — I10 Essential (primary) hypertension: Secondary | ICD-10-CM

## 2016-06-12 MED ORDER — SODIUM CHLORIDE 0.9 % IV SOLN: INTRAVENOUS | Status: DC

## 2016-06-12 MED ORDER — TRANEXAMIC ACID 1000 MG/10ML IV SOLN
1000.0000 mg | INTRAVENOUS | Status: AC
  Administered 2016-06-15: 1000 mg via INTRAVENOUS
  Filled 2016-06-12: qty 10

## 2016-06-12 MED ORDER — CEFAZOLIN SODIUM-DEXTROSE 2-4 GM/100ML-% IV SOLN
2.0000 g | INTRAVENOUS | Status: AC
  Administered 2016-06-15: 2 g via INTRAVENOUS

## 2016-06-14 ENCOUNTER — Encounter (HOSPITAL_COMMUNITY): Payer: Self-pay | Admitting: Certified Registered Nurse Anesthetist

## 2016-06-14 NOTE — Anesthesia Preprocedure Evaluation (Addendum)
Anesthesia Evaluation  Patient identified by MRN, date of birth, ID band Patient awake    Reviewed: Allergy & Precautions, NPO status , Patient's Chart, lab work & pertinent test results  Airway Mallampati: II  TM Distance: >3 FB Neck ROM: Full    Dental  (+) Dental Advisory Given   Pulmonary neg pulmonary ROS,    breath sounds clear to auscultation       Cardiovascular hypertension, Pt. on medications  Rhythm:Regular Rate:Normal     Neuro/Psych TIA   GI/Hepatic negative GI ROS, Neg liver ROS,   Endo/Other  negative endocrine ROS  Renal/GU negative Renal ROS     Musculoskeletal   Abdominal   Peds  Hematology negative hematology ROS (+)   Anesthesia Other Findings   Reproductive/Obstetrics                             Lab Results  Component Value Date   WBC 6.1 06/10/2016   HGB 13.1 06/10/2016   HCT 38.0 (L) 06/10/2016   MCV 87.4 06/10/2016   PLT 270 06/10/2016   Lab Results  Component Value Date   CREATININE 1.05 06/10/2016   BUN 21 (H) 06/10/2016   NA 141 06/10/2016   K 3.9 06/10/2016   CL 107 06/10/2016   CO2 29 06/10/2016   No results found for: INR, PROTIME  Anesthesia Physical  Anesthesia Plan  ASA: II  Anesthesia Plan: General   Post-op Pain Management:    Induction: Intravenous  Airway Management Planned: LMA  Additional Equipment:   Intra-op Plan:   Post-operative Plan:   Informed Consent: I have reviewed the patients History and Physical, chart, labs and discussed the procedure including the risks, benefits and alternatives for the proposed anesthesia with the patient or authorized representative who has indicated his/her understanding and acceptance.     Plan Discussed with: CRNA  Anesthesia Plan Comments:        Anesthesia Quick Evaluation

## 2016-06-15 ENCOUNTER — Encounter (HOSPITAL_COMMUNITY): Payer: Self-pay | Admitting: Anesthesiology

## 2016-06-15 ENCOUNTER — Encounter (HOSPITAL_COMMUNITY)
Admission: RE | Disposition: A | Discharge status: Home Health | Payer: Self-pay | Source: Ambulatory Visit | Attending: Orthopedic Surgery

## 2016-06-15 ENCOUNTER — Inpatient Hospital Stay (HOSPITAL_COMMUNITY)
Admission: RE | Admit: 2016-06-15 | Discharge: 2016-06-16 | DRG: 470 | Disposition: A | Discharge status: Home Health | Payer: Managed Care, Other (non HMO) | Source: Ambulatory Visit | Attending: Orthopedic Surgery | Admitting: Orthopedic Surgery

## 2016-06-15 ENCOUNTER — Inpatient Hospital Stay (HOSPITAL_COMMUNITY): Payer: Managed Care, Other (non HMO) | Admitting: Certified Registered Nurse Anesthetist

## 2016-06-15 ENCOUNTER — Inpatient Hospital Stay (HOSPITAL_COMMUNITY): Payer: Managed Care, Other (non HMO)

## 2016-06-15 DIAGNOSIS — Z419 Encounter for procedure for purposes other than remedying health state, unspecified: Secondary | ICD-10-CM

## 2016-06-15 DIAGNOSIS — M1611 Unilateral primary osteoarthritis, right hip: Secondary | ICD-10-CM | POA: Diagnosis present

## 2016-06-15 DIAGNOSIS — Z9889 Other specified postprocedural states: Secondary | ICD-10-CM

## 2016-06-15 DIAGNOSIS — M25551 Pain in right hip: Secondary | ICD-10-CM | POA: Diagnosis present

## 2016-06-15 DIAGNOSIS — D62 Acute posthemorrhagic anemia: Secondary | ICD-10-CM | POA: Diagnosis not present

## 2016-06-15 DIAGNOSIS — Z96642 Presence of left artificial hip joint: Secondary | ICD-10-CM | POA: Diagnosis present

## 2016-06-15 DIAGNOSIS — I10 Essential (primary) hypertension: Secondary | ICD-10-CM | POA: Diagnosis present

## 2016-06-15 DIAGNOSIS — Z8673 Personal history of transient ischemic attack (TIA), and cerebral infarction without residual deficits: Secondary | ICD-10-CM | POA: Diagnosis not present

## 2016-06-15 DIAGNOSIS — Z09 Encounter for follow-up examination after completed treatment for conditions other than malignant neoplasm: Secondary | ICD-10-CM

## 2016-06-15 HISTORY — PX: TOTAL HIP ARTHROPLASTY: SHX124

## 2016-06-15 SURGERY — ARTHROPLASTY, HIP, TOTAL, ANTERIOR APPROACH
Anesthesia: General | Site: Hip | Laterality: Right

## 2016-06-15 MED ORDER — ACETAMINOPHEN 325 MG PO TABS: 650.0000 mg | ORAL_TABLET | Freq: Four times a day (QID) | ORAL | Status: DC | PRN

## 2016-06-15 MED ORDER — ATORVASTATIN CALCIUM 40 MG PO TABS
40.0000 mg | ORAL_TABLET | Freq: Every day | ORAL | Status: DC
  Filled 2016-06-15: qty 1

## 2016-06-15 MED ORDER — ONDANSETRON HCL 4 MG/2ML IJ SOLN
INTRAMUSCULAR | Status: DC | PRN
  Administered 2016-06-15: 4 mg via INTRAVENOUS

## 2016-06-15 MED ORDER — HYDROMORPHONE HCL 1 MG/ML IJ SOLN
INTRAMUSCULAR | Status: AC
  Administered 2016-06-15: 0.5 mg via INTRAVENOUS
  Filled 2016-06-15: qty 0.5

## 2016-06-15 MED ORDER — SODIUM CHLORIDE 0.9 % IV SOLN
INTRAVENOUS | Status: DC
  Administered 2016-06-15: 13:00:00 via INTRAVENOUS

## 2016-06-15 MED ORDER — SODIUM CHLORIDE 0.9 % IV SOLN
INTRAVENOUS | Status: DC | PRN
  Administered 2016-06-15: 250 mL

## 2016-06-15 MED ORDER — ASPIRIN 81 MG PO CHEW
81.0000 mg | CHEWABLE_TABLET | Freq: Two times a day (BID) | ORAL | Status: DC
  Administered 2016-06-15 – 2016-06-16 (×2): 81 mg via ORAL
  Filled 2016-06-15 (×2): qty 1

## 2016-06-15 MED ORDER — HYDROMORPHONE HCL 2 MG/ML IJ SOLN: 0.5000 mg | INTRAMUSCULAR | Status: DC | PRN

## 2016-06-15 MED ORDER — KETOROLAC TROMETHAMINE 15 MG/ML IJ SOLN
15.0000 mg | Freq: Four times a day (QID) | INTRAMUSCULAR | Status: DC
  Administered 2016-06-15 – 2016-06-16 (×3): 15 mg via INTRAVENOUS
  Filled 2016-06-15 (×3): qty 1

## 2016-06-15 MED ORDER — LIDOCAINE 2% (20 MG/ML) 5 ML SYRINGE
INTRAMUSCULAR | Status: AC
  Filled 2016-06-15: qty 5

## 2016-06-15 MED ORDER — EPINEPHRINE PF 1 MG/ML IJ SOLN
INTRAMUSCULAR | Status: AC
  Filled 2016-06-15: qty 1

## 2016-06-15 MED ORDER — METOCLOPRAMIDE HCL 5 MG PO TABS: 5.0000 mg | ORAL_TABLET | Freq: Three times a day (TID) | ORAL | Status: DC | PRN

## 2016-06-15 MED ORDER — DEXAMETHASONE SODIUM PHOSPHATE 10 MG/ML IJ SOLN
INTRAMUSCULAR | Status: AC
  Filled 2016-06-15: qty 1

## 2016-06-15 MED ORDER — ONDANSETRON HCL 4 MG/2ML IJ SOLN
INTRAMUSCULAR | Status: AC
  Filled 2016-06-15: qty 2

## 2016-06-15 MED ORDER — ALBUMIN HUMAN 5 % IV SOLN
INTRAVENOUS | Status: DC | PRN
  Administered 2016-06-15: 10:00:00 via INTRAVENOUS

## 2016-06-15 MED ORDER — METHOCARBAMOL 1000 MG/10ML IJ SOLN: 500.0000 mg | Freq: Four times a day (QID) | INTRAVENOUS | Status: DC | PRN

## 2016-06-15 MED ORDER — POLYETHYLENE GLYCOL 3350 17 G PO PACK: 17.0000 g | PACK | Freq: Every day | ORAL | Status: DC | PRN

## 2016-06-15 MED ORDER — PHENOL 1.4 % MT LIQD
1.0000 | OROMUCOSAL | Status: DC | PRN
Start: 2016-06-15 — End: 2016-06-16

## 2016-06-15 MED ORDER — PROPOFOL 500 MG/50ML IV EMUL
INTRAVENOUS | Status: AC
  Filled 2016-06-15: qty 50

## 2016-06-15 MED ORDER — OXYMETAZOLINE HCL 0.05 % NA SOLN
1.0000 | Freq: Two times a day (BID) | NASAL | Status: DC
  Administered 2016-06-15 – 2016-06-16 (×2): 1 via NASAL
  Filled 2016-06-15: qty 15

## 2016-06-15 MED ORDER — LIDOCAINE HCL (CARDIAC) 20 MG/ML IV SOLN
INTRAVENOUS | Status: DC | PRN
  Administered 2016-06-15: 50 mg via INTRAVENOUS

## 2016-06-15 MED ORDER — TRANEXAMIC ACID 1000 MG/10ML IV SOLN
1000.0000 mg | Freq: Once | INTRAVENOUS | Status: AC
  Administered 2016-06-15: 1000 mg via INTRAVENOUS
  Filled 2016-06-15: qty 10

## 2016-06-15 MED ORDER — METOCLOPRAMIDE HCL 5 MG/ML IJ SOLN: 5.0000 mg | Freq: Three times a day (TID) | INTRAMUSCULAR | Status: DC | PRN

## 2016-06-15 MED ORDER — EPINEPHRINE HCL (NASAL) 0.1 % NA SOLN
NASAL | Status: AC
  Filled 2016-06-15: qty 30

## 2016-06-15 MED ORDER — BUPIVACAINE HCL 0.5 % IJ SOLN
INTRAMUSCULAR | Status: DC | PRN
  Administered 2016-06-15: 30 mL

## 2016-06-15 MED ORDER — FENTANYL CITRATE (PF) 100 MCG/2ML IJ SOLN
INTRAMUSCULAR | Status: AC
  Filled 2016-06-15: qty 2

## 2016-06-15 MED ORDER — FENTANYL CITRATE (PF) 100 MCG/2ML IJ SOLN
INTRAMUSCULAR | Status: DC | PRN
  Administered 2016-06-15: 50 ug via INTRAVENOUS
  Administered 2016-06-15: 100 ug via INTRAVENOUS
  Administered 2016-06-15: 25 ug via INTRAVENOUS
  Administered 2016-06-15: 50 ug via INTRAVENOUS
  Administered 2016-06-15: 25 ug via INTRAVENOUS

## 2016-06-15 MED ORDER — PROPOFOL 10 MG/ML IV BOLUS
INTRAVENOUS | Status: DC | PRN
  Administered 2016-06-15: 150 mg via INTRAVENOUS

## 2016-06-15 MED ORDER — DOCUSATE SODIUM 100 MG PO CAPS
100.0000 mg | ORAL_CAPSULE | Freq: Two times a day (BID) | ORAL | Status: DC
  Administered 2016-06-15 – 2016-06-16 (×2): 100 mg via ORAL
  Filled 2016-06-15 (×2): qty 1

## 2016-06-15 MED ORDER — MIDAZOLAM HCL 2 MG/2ML IJ SOLN
INTRAMUSCULAR | Status: AC
  Filled 2016-06-15: qty 2

## 2016-06-15 MED ORDER — LACTATED RINGERS IV SOLN
INTRAVENOUS | Status: DC | PRN
  Administered 2016-06-15: 08:00:00 via INTRAVENOUS

## 2016-06-15 MED ORDER — ONDANSETRON HCL 4 MG/2ML IJ SOLN: 4.0000 mg | Freq: Four times a day (QID) | INTRAMUSCULAR | Status: DC | PRN

## 2016-06-15 MED ORDER — ACETAMINOPHEN 650 MG RE SUPP: 650.0000 mg | Freq: Four times a day (QID) | RECTAL | Status: DC | PRN

## 2016-06-15 MED ORDER — EPHEDRINE 5 MG/ML INJ
INTRAVENOUS | Status: AC
  Filled 2016-06-15: qty 10

## 2016-06-15 MED ORDER — AMLODIPINE BESYLATE 5 MG PO TABS
5.0000 mg | ORAL_TABLET | Freq: Every day | ORAL | Status: DC
  Filled 2016-06-15: qty 1

## 2016-06-15 MED ORDER — BUPIVACAINE HCL (PF) 0.5 % IJ SOLN
INTRAMUSCULAR | Status: AC
  Filled 2016-06-15: qty 30

## 2016-06-15 MED ORDER — ACETAMINOPHEN 10 MG/ML IV SOLN
INTRAVENOUS | Status: DC | PRN
  Administered 2016-06-15: 1000 mg via INTRAVENOUS

## 2016-06-15 MED ORDER — VITAMIN C 500 MG PO TABS
1000.0000 mg | ORAL_TABLET | Freq: Every day | ORAL | Status: DC
  Filled 2016-06-15: qty 2

## 2016-06-15 MED ORDER — PROPOFOL 1000 MG/100ML IV EMUL
INTRAVENOUS | Status: AC
  Filled 2016-06-15: qty 100

## 2016-06-15 MED ORDER — 0.9 % SODIUM CHLORIDE (POUR BTL) OPTIME
TOPICAL | Status: DC | PRN
  Administered 2016-06-15: 1000 mL

## 2016-06-15 MED ORDER — KETOROLAC TROMETHAMINE 30 MG/ML IJ SOLN
INTRAMUSCULAR | Status: DC | PRN
  Administered 2016-06-15: 30 mg

## 2016-06-15 MED ORDER — ACETAMINOPHEN 10 MG/ML IV SOLN
INTRAVENOUS | Status: AC
Start: 2016-06-15 — End: 2016-06-15
  Filled 2016-06-15: qty 100

## 2016-06-15 MED ORDER — ONDANSETRON HCL 4 MG/2ML IJ SOLN
INTRAMUSCULAR | Status: AC
  Administered 2016-06-15: 4 mg
  Filled 2016-06-15: qty 2

## 2016-06-15 MED ORDER — SENNA 8.6 MG PO TABS
2.0000 | ORAL_TABLET | Freq: Every day | ORAL | Status: DC
  Administered 2016-06-15: 17.2 mg via ORAL
  Filled 2016-06-15: qty 2

## 2016-06-15 MED ORDER — EPHEDRINE SULFATE 50 MG/ML IJ SOLN
INTRAMUSCULAR | Status: DC | PRN
  Administered 2016-06-15: 15 mg via INTRAVENOUS
  Administered 2016-06-15 (×2): 10 mg via INTRAVENOUS

## 2016-06-15 MED ORDER — KETOROLAC TROMETHAMINE 30 MG/ML IJ SOLN
INTRAMUSCULAR | Status: AC
  Filled 2016-06-15: qty 1

## 2016-06-15 MED ORDER — METHOCARBAMOL 500 MG PO TABS
500.0000 mg | ORAL_TABLET | Freq: Four times a day (QID) | ORAL | Status: DC | PRN
  Administered 2016-06-15 – 2016-06-16 (×4): 500 mg via ORAL
  Filled 2016-06-15 (×4): qty 1

## 2016-06-15 MED ORDER — SODIUM CHLORIDE 0.9 % IJ SOLN
INTRAMUSCULAR | Status: DC | PRN
  Administered 2016-06-15: 30 mL

## 2016-06-15 MED ORDER — HYDROCODONE-ACETAMINOPHEN 5-325 MG PO TABS
1.0000 | ORAL_TABLET | ORAL | Status: DC | PRN
  Administered 2016-06-15 – 2016-06-16 (×5): 1 via ORAL
  Filled 2016-06-15 (×2): qty 1
  Filled 2016-06-15: qty 2
  Filled 2016-06-15 (×2): qty 1

## 2016-06-15 MED ORDER — SODIUM CHLORIDE 0.9 % IR SOLN
Status: DC | PRN
  Administered 2016-06-15: 3000 mL

## 2016-06-15 MED ORDER — PROPOFOL 10 MG/ML IV BOLUS
INTRAVENOUS | Status: AC
  Filled 2016-06-15: qty 20

## 2016-06-15 MED ORDER — ONDANSETRON HCL 4 MG PO TABS: 4.0000 mg | ORAL_TABLET | Freq: Four times a day (QID) | ORAL | Status: DC | PRN

## 2016-06-15 MED ORDER — CEFAZOLIN SODIUM-DEXTROSE 2-4 GM/100ML-% IV SOLN
2.0000 g | Freq: Four times a day (QID) | INTRAVENOUS | Status: AC
  Administered 2016-06-15 (×2): 2 g via INTRAVENOUS
  Filled 2016-06-15 (×2): qty 100

## 2016-06-15 MED ORDER — HYDROMORPHONE HCL 1 MG/ML IJ SOLN
0.2500 mg | INTRAMUSCULAR | Status: DC | PRN
  Administered 2016-06-15 (×2): 0.5 mg via INTRAVENOUS

## 2016-06-15 MED ORDER — MENTHOL 3 MG MT LOZG: 1.0000 | LOZENGE | OROMUCOSAL | Status: DC | PRN

## 2016-06-15 MED ORDER — MIDAZOLAM HCL 5 MG/5ML IJ SOLN
INTRAMUSCULAR | Status: DC | PRN
  Administered 2016-06-15: 2 mg via INTRAVENOUS

## 2016-06-15 MED ORDER — DEXAMETHASONE SODIUM PHOSPHATE 10 MG/ML IJ SOLN
10.0000 mg | Freq: Once | INTRAMUSCULAR | Status: AC
  Administered 2016-06-16: 10 mg via INTRAVENOUS
  Filled 2016-06-15: qty 1

## 2016-06-15 SURGICAL SUPPLY — 53 items
ALCOHOL ISOPROPYL (RUBBING) (MISCELLANEOUS) ×3 IMPLANT
CAPT HIP TOTAL 2 ×3 IMPLANT
CHLORAPREP W/TINT 26ML (MISCELLANEOUS) ×3 IMPLANT
COVER SURGICAL LIGHT HANDLE (MISCELLANEOUS) ×3 IMPLANT
DERMABOND ADVANCED (GAUZE/BANDAGES/DRESSINGS) ×4
DERMABOND ADVANCED .7 DNX12 (GAUZE/BANDAGES/DRESSINGS) ×2 IMPLANT
DRAPE C-ARM 42X72 X-RAY (DRAPES) ×3 IMPLANT
DRAPE IMP U-DRAPE 54X76 (DRAPES) ×6 IMPLANT
DRAPE STERI IOBAN 125X83 (DRAPES) ×3 IMPLANT
DRAPE U-SHAPE 47X51 STRL (DRAPES) ×9 IMPLANT
DRSG AQUACEL AG ADV 3.5X10 (GAUZE/BANDAGES/DRESSINGS) ×3 IMPLANT
ELECT BLADE 4.0 EZ CLEAN MEGAD (MISCELLANEOUS) ×3
ELECT REM PT RETURN 9FT ADLT (ELECTROSURGICAL) ×3
ELECTRODE BLDE 4.0 EZ CLN MEGD (MISCELLANEOUS) ×1 IMPLANT
ELECTRODE REM PT RTRN 9FT ADLT (ELECTROSURGICAL) ×1 IMPLANT
GLOVE BIO SURGEON STRL SZ8.5 (GLOVE) ×9 IMPLANT
GLOVE BIOGEL PI IND STRL 7.0 (GLOVE) ×2 IMPLANT
GLOVE BIOGEL PI IND STRL 8.5 (GLOVE) ×1 IMPLANT
GLOVE BIOGEL PI INDICATOR 7.0 (GLOVE) ×4
GLOVE BIOGEL PI INDICATOR 8.5 (GLOVE) ×2
GLOVE SURG SS PI 6.5 STRL IVOR (GLOVE) ×6 IMPLANT
GOWN STRL REUS W/ TWL LRG LVL3 (GOWN DISPOSABLE) ×2 IMPLANT
GOWN STRL REUS W/TWL 2XL LVL3 (GOWN DISPOSABLE) ×6 IMPLANT
GOWN STRL REUS W/TWL LRG LVL3 (GOWN DISPOSABLE) ×4
HANDPIECE INTERPULSE COAX TIP (DISPOSABLE) ×2
HOOD PEEL AWAY FACE SHEILD DIS (HOOD) ×6 IMPLANT
KIT BASIN OR (CUSTOM PROCEDURE TRAY) ×3 IMPLANT
KIT ROOM TURNOVER OR (KITS) ×3 IMPLANT
MANIFOLD NEPTUNE II (INSTRUMENTS) ×3 IMPLANT
MANIFOLD NEPTUNE WASTE (CANNULA) ×6 IMPLANT
MARKER SKIN DUAL TIP RULER LAB (MISCELLANEOUS) ×6 IMPLANT
NEEDLE SPNL 18GX3.5 QUINCKE PK (NEEDLE) ×3 IMPLANT
NS IRRIG 1000ML POUR BTL (IV SOLUTION) ×3 IMPLANT
PACK TOTAL JOINT (CUSTOM PROCEDURE TRAY) ×3 IMPLANT
PACK UNIVERSAL I (CUSTOM PROCEDURE TRAY) ×3 IMPLANT
PAD ARMBOARD 7.5X6 YLW CONV (MISCELLANEOUS) ×6 IMPLANT
SAW OSC TIP CART 19.5X105X1.3 (SAW) ×3 IMPLANT
SEALER BIPOLAR AQUA 6.0 (INSTRUMENTS) ×3 IMPLANT
SET HNDPC FAN SPRY TIP SCT (DISPOSABLE) ×1 IMPLANT
SUCTION FRAZIER HANDLE 10FR (MISCELLANEOUS) ×2
SUCTION TUBE FRAZIER 10FR DISP (MISCELLANEOUS) ×1 IMPLANT
SUT ETHIBOND NAB CT1 #1 30IN (SUTURE) ×6 IMPLANT
SUT MNCRL AB 3-0 PS2 18 (SUTURE) ×3 IMPLANT
SUT MON AB 2-0 CT1 36 (SUTURE) ×3 IMPLANT
SUT VIC AB 1 CT1 27 (SUTURE) ×2
SUT VIC AB 1 CT1 27XBRD ANBCTR (SUTURE) ×1 IMPLANT
SUT VIC AB 2-0 CT1 27 (SUTURE) ×2
SUT VIC AB 2-0 CT1 TAPERPNT 27 (SUTURE) ×1 IMPLANT
SUT VLOC 180 0 24IN GS25 (SUTURE) ×3 IMPLANT
SYR 50ML LL SCALE MARK (SYRINGE) ×3 IMPLANT
TOWEL OR 17X24 6PK STRL BLUE (TOWEL DISPOSABLE) ×3 IMPLANT
TOWEL OR 17X26 10 PK STRL BLUE (TOWEL DISPOSABLE) ×3 IMPLANT
TRAY CATH 16FR W/PLASTIC CATH (SET/KITS/TRAYS/PACK) IMPLANT

## 2016-06-15 NOTE — Interval H&P Note (Signed)
History and Physical Interval Note:  06/15/2016 7:30 AM  Jerome Shaffer  has presented today for surgery, with the diagnosis of DJD RIGHT HIP  The various methods of treatment have been discussed with the patient and family. After consideration of risks, benefits and other options for treatment, the patient has consented to  Procedure(s): RIGHT TOTAL HIP ARTHROPLASTY ANTERIOR APPROACH (Right) as a surgical intervention .  The patient's history has been reviewed, patient examined, no change in status, stable for surgery.  I have reviewed the patient's chart and labs.  Questions were answered to the patient's satisfaction.     Ernst Cumpston, Horald Pollen

## 2016-06-15 NOTE — Transfer of Care (Signed)
Immediate Anesthesia Transfer of Care Note  Patient: Jerome Shaffer  Procedure(s) Performed: Procedure(s): RIGHT TOTAL HIP ARTHROPLASTY ANTERIOR APPROACH (Right)  Patient Location: PACU  Anesthesia Type:General  Level of Consciousness: awake and sedated  Airway & Oxygen Therapy: Patient Spontanous Breathing and Patient connected to face mask oxygen  Post-op Assessment: Report given to RN and Post -op Vital signs reviewed and stable  Post vital signs: Reviewed and stable  Last Vitals:  Vitals:   06/15/16 0602 06/15/16 1010  BP: (!) 145/75   Pulse: 75   Resp: 20   Temp: 36.9 C (!) (P) 36 C    Last Pain:  Vitals:   06/15/16 0602  TempSrc: Oral         Complications: No apparent anesthesia complications

## 2016-06-15 NOTE — Anesthesia Postprocedure Evaluation (Signed)
Anesthesia Post Note  Patient: Jerome Shaffer  Procedure(s) Performed: Procedure(s) (LRB): RIGHT TOTAL HIP ARTHROPLASTY ANTERIOR APPROACH (Right)  Patient location during evaluation: PACU Anesthesia Type: General Level of consciousness: sedated Pain management: satisfactory to patient Vital Signs Assessment: post-procedure vital signs reviewed and stable Respiratory status: spontaneous breathing Cardiovascular status: stable Anesthetic complications: no    Last Vitals:  Vitals:   06/15/16 1204 06/15/16 1410  BP: 112/66 115/63  Pulse: 76 77  Resp:    Temp: 36.8 C 36.3 C    Last Pain:  Vitals:   06/15/16 1410  TempSrc: Oral  PainSc:                  Riccardo Dubin

## 2016-06-15 NOTE — Op Note (Signed)
OPERATIVE REPORT  SURGEON: Rod Can, MD   ASSISTANT: Ky Barban, RNFA.  PREOPERATIVE DIAGNOSIS: Right hip arthritis.   POSTOPERATIVE DIAGNOSIS: Right hip arthritis.   PROCEDURE: Right total hip arthroplasty, anterior approach.   IMPLANTS: DePuy Tri Lock stem, size 6, hi offset. DePuy Pinnacle Cup, size 56 mm. DePuy Altrx liner, size 36 by 56 mm, neutral. DePuy Biolox ceramic head ball, size 36 + 5 mm.  ANESTHESIA:  General  ESTIMATED BLOOD LOSS: 550 mL.  ANTIBIOTICS: 2 g Ancef.  DRAINS: None.  COMPLICATIONS: None.   CONDITION: PACU - hemodynamically stable.  BRIEF CLINICAL NOTE: Jerome Shaffer is a 60 y.o. male with a long-standing history of Right hip arthritis. After failing conservative management, the patient was indicated for total hip arthroplasty. The risks, benefits, and alternatives to the procedure were explained, and the patient elected to proceed.  PROCEDURE IN DETAIL: Surgical site was marked by myself. Once inside the operative room, general anesthesia was obtained. The patient was then positioned on the Hana table. All bony prominences were well padded. The hip was prepped and draped in the normal sterile surgical fashion. A time-out was called verifying side and site of surgery. The patient received IV antibiotics within 60 minutes of beginning the procedure.  The direct anterior approach to the hip was performed through the Hueter interval. Lateral femoral circumflex vessels were treated with the Auqumantys. The anterior capsule was exposed and an inverted T capsulotomy was made.The femoral neck cut was made to the level of the templated cut. A corkscrew was placed into the head and the head was removed. The femoral head was found to have eburnated bone. The head was passed to the back table and was measured.  Acetabular exposure was achieved, and the pulvinar and labrum were excised. Sequental reaming of the acetabulum was then  performed up to a size 55 mm reamer. A 56 mm cup was then opened and impacted into place at approximately 40 degrees of abduction and 20 degrees of anteversion. The final polyethylene liner was impacted into place and acetabular osteophytes were removed.   I then gained femoral exposure taking care to protect the abductors and greater trochanter. This was performed using standard external rotation, extension, and adduction. The capsule was peeled off the inner aspect of the greater trochanter, taking care to preserve the short external rotators. A cookie cutter was used to enter the femoral canal, and then the femoral canal finder was placed. Sequential broaching was performed up to a size 6. Calcar planer was used on the femoral neck remnant. I placed a hi offset neck and a trial head ball. The hip was reduced. Leg lengths and offset were checked fluoroscopically. The hip was dislocated and trial components were removed. The final implants were placed, and the hip was reduced.  Fluoroscopy was used to confirm component position and leg lengths. At 90 degrees of external rotation and full extension, the hip was stable to an anterior directed force.  The wound was copiously irrigated with a dilute betadine solution followed by normal saline. Marcaine solution was injected into the periarticular soft tissue. The wound was closed in layers using #1 Vicryl and V-Loc for the fascia, 2-0 Vicryl for the subcutaneous fat, 2-0 Monocryl for the deep dermal layer, 3-0 running Monocryl subcuticular stitch, and Dermabond for the skin. Once the glue was fully dried, an Aquacell Ag dressing was applied. The patient was transported to the recovery room in stable condition. Sponge, needle, and instrument counts were correct  at the end of the case x2. The patient tolerated the procedure well and there were no known complications.

## 2016-06-15 NOTE — Discharge Instructions (Signed)
°Dr. Dale Strausser °Joint Replacement Specialist °[facility] °[address] °[place], [place] [address] °[phone] ° ° °TOTAL HIP REPLACEMENT POSTOPERATIVE DIRECTIONS ° ° ° °Hip Rehabilitation, Guidelines Following Surgery  ° °WEIGHT BEARING °Weight bearing as tolerated with assist device (walker, cane, etc) as directed, use it as long as suggested by your surgeon or therapist, typically at least 4-6 weeks. ° °The results of a hip operation are greatly improved after range of motion and muscle strengthening exercises. Follow all safety measures which are given to protect your hip. If any of these exercises cause increased pain or swelling in your joint, decrease the amount until you are comfortable again. Then slowly increase the exercises. Call your caregiver if you have problems or questions.  ° °HOME CARE INSTRUCTIONS  °Most of the following instructions are designed to prevent the dislocation of your new hip.  °Remove items at home which could result in a fall. This includes throw rugs or furniture in walking pathways.  °Continue medications as instructed at time of discharge. °· You may have some home medications which will be placed on hold until you complete the course of blood thinner medication. °· You may start showering once you are discharged home. Do not remove your dressing. °Do not put on socks or shoes without following the instructions of your caregivers.   °Sit on chairs with arms. Use the chair arms to help push yourself up when arising.  °Arrange for the use of a toilet seat elevator so you are not sitting low.  °· Walk with walker as instructed.  °You may resume a sexual relationship in one month or when given the OK by your caregiver.  °Use walker as long as suggested by your caregivers.  °You may put full weight on your legs and walk as much as is comfortable. °Avoid periods of inactivity such as sitting longer than an hour when not asleep. This helps prevent  blood clots.  °You may return to work once you are cleared by your surgeon.  °Do not drive a car for 6 weeks or until released by your surgeon.  °Do not drive while taking narcotics.  °Wear elastic stockings for two weeks following surgery during the day but you may remove then at night.  °Make sure you keep all of your appointments after your operation with all of your doctors and caregivers. You should call the office at the above phone number and make an appointment for approximately two weeks after the date of your surgery. °Please pick up a stool softener and laxative for home use as long as you are requiring pain medications. °· ICE to the affected hip every three hours for 30 minutes at a time and then as needed for pain and swelling. Continue to use ice on the hip for pain and swelling from surgery. You may notice swelling that will progress down to the foot and ankle.  This is normal after surgery.  Elevate the leg when you are not up walking on it.   °It is important for you to complete the blood thinner medication as prescribed by your doctor. °· Continue to use the breathing machine which will help keep your temperature down.  It is common for your temperature to cycle up and down following surgery, especially at night when you are not up moving around and exerting yourself.  The breathing machine keeps your lungs expanded and your temperature down. ° °RANGE OF MOTION AND STRENGTHENING EXERCISES  °These exercises are   designed to help you keep full movement of your hip joint. Follow your caregiver's or physical therapist's instructions. Perform all exercises about fifteen times, three times per day or as directed. Exercise both hips, even if you have had only one joint replacement. These exercises can be done on a training (exercise) mat, on the floor, on a table or on a bed. Use whatever works the best and is most comfortable for you. Use music or television while you are exercising so that the exercises  are a pleasant break in your day. This will make your life better with the exercises acting as a break in routine you can look forward to.  °Lying on your back, slowly slide your foot toward your buttocks, raising your knee up off the floor. Then slowly slide your foot back down until your leg is straight again.  °Lying on your back spread your legs as far apart as you can without causing discomfort.  °Lying on your side, raise your upper leg and foot straight up from the floor as far as is comfortable. Slowly lower the leg and repeat.  °Lying on your back, tighten up the muscle in the front of your thigh (quadriceps muscles). You can do this by keeping your leg straight and trying to raise your heel off the floor. This helps strengthen the largest muscle supporting your knee.  °Lying on your back, tighten up the muscles of your buttocks both with the legs straight and with the knee bent at a comfortable angle while keeping your heel on the floor.  ° °SKILLED REHAB INSTRUCTIONS: °If the patient is transferred to a skilled rehab facility following release from the hospital, a list of the current medications will be sent to the facility for the patient to continue.  When discharged from the skilled rehab facility, please have the facility set up the patient's Home Health Physical Therapy prior to being released. Also, the skilled facility will be responsible for providing the patient with their medications at time of release from the facility to include their pain medication and their blood thinner medication. If the patient is still at the rehab facility at time of the two week follow up appointment, the skilled rehab facility will also need to assist the patient in arranging follow up appointment in our office and any transportation needs. ° °MAKE SURE YOU:  °Understand these instructions.  °Will watch your condition.  °Will get help right away if you are not doing well or get worse. ° °Pick up stool softner and  laxative for home use following surgery while on pain medications. °Do not remove your dressing. °The dressing is waterproof--it is OK to take showers. °Continue to use ice for pain and swelling after surgery. °Do not use any lotions or creams on the incision until instructed by your surgeon. °Total Hip Protocol. ° ° °

## 2016-06-15 NOTE — Anesthesia Procedure Notes (Signed)
Procedure Name: LMA Insertion Performed by: Emonte Dieujuste W Pre-anesthesia Checklist: Patient identified, Emergency Drugs available, Suction available and Patient being monitored Patient Re-evaluated:Patient Re-evaluated prior to inductionOxygen Delivery Method: Circle system utilized Preoxygenation: Pre-oxygenation with 100% oxygen Intubation Type: IV induction Ventilation: Mask ventilation without difficulty LMA: LMA inserted LMA Size: 4.0 Number of attempts: 1 Airway Equipment and Method: Bite block Placement Confirmation: positive ETCO2 Tube secured with: Tape Dental Injury: Teeth and Oropharynx as per pre-operative assessment        

## 2016-06-15 NOTE — Evaluation (Signed)
Physical Therapy Evaluation Patient Details Name: Jerome Shaffer MRN: [id number] DOB: 26-Oct-1955 Today's Date: 06/15/2016   History of Present Illness  60 y.o. male now s/p direct anterior THA Rt. PMH: Lt THA, TIA.   Clinical Impression  Pt is s/p Rt direct anterior THA resulting in the deficits listed below (see PT Problem List). Pt mobilizing very well during initial PT session. Anticipate that the pt will D/C to home with his spouse to assist. Pt will benefit from skilled PT to increase their independence and safety with mobility.      Follow Up Recommendations Home health PT;Supervision for mobility/OOB    Equipment Recommendations  None recommended by PT    Recommendations for Other Services       Precautions / Restrictions Precautions Precautions: None Restrictions Weight Bearing Restrictions: Yes RLE Weight Bearing: Weight bearing as tolerated      Mobility  Bed Mobility Overal bed mobility: Needs Assistance Bed Mobility: Sit to Supine       Sit to supine: Min guard      Transfers Overall transfer level: Needs assistance Equipment used: None Transfers: Sit to/from Stand Sit to Stand: Supervision         General transfer comment: stable without device  Ambulation/Gait Ambulation/Gait assistance: Supervision Ambulation Distance (Feet): 60 Feet Assistive device: Rolling walker (2 wheeled) Gait Pattern/deviations: Step-through pattern;Decreased step length - right;Decreased step length - left Gait velocity: decreased   General Gait Details: no loss of balance, decreased stride length bilaterally  Stairs            Wheelchair Mobility    Modified Rankin (Stroke Patients Only)       Balance Overall balance assessment: Needs assistance Sitting-balance support: No upper extremity supported Sitting balance-Leahy Scale: Good     Standing balance support: No upper extremity supported Standing balance-Leahy Scale: Fair Standing balance  comment: static                             Pertinent Vitals/Pain Pain Assessment: 0-10 Pain Score: 4  Pain Location: Rt hip Pain Descriptors / Indicators: Operative site guarding;Dull;Aching Pain Intervention(s): Monitored during session;Limited activity within patient's tolerance;Ice applied    Home Living Family/patient expects to be discharged to:: Private residence Living Arrangements: Spouse/significant other Available Help at Discharge: Family;Available 24 hours/day Type of Home: House Home Access: Stairs to enter Entrance Stairs-Rails: Left Entrance Stairs-Number of Steps: 5 Home Layout: Two level Home Equipment: Walker - 2 wheels;Bedside commode Additional Comments: bedroom/bathroom on second level    Prior Function Level of Independence: Independent               Hand Dominance        Extremity/Trunk Assessment   Upper Extremity Assessment: Overall WFL for tasks assessed           Lower Extremity Assessment: RLE deficits/detail RLE Deficits / Details: fair quad activation.        Communication   Communication: No difficulties  Cognition Arousal/Alertness: Awake/alert Behavior During Therapy: WFL for tasks assessed/performed Overall Cognitive Status: Within Functional Limits for tasks assessed                      General Comments      Exercises     Assessment/Plan    PT Assessment Patient needs continued PT services  PT Problem List Decreased strength;Decreased balance;Decreased range of motion;Decreased activity tolerance;Decreased mobility;Pain  PT Treatment Interventions DME instruction;Stair training;Gait training;Functional mobility training;Therapeutic activities;Therapeutic exercise;Balance training;Patient/family education    PT Goals (Current goals can be found in the Care Plan section)  Acute Rehab PT Goals Patient Stated Goal: get back to playing tennis PT Goal Formulation: With patient Time  For Goal Achievement: 06/29/16 Potential to Achieve Goals: Good    Frequency 7X/week   Barriers to discharge        Co-evaluation               End of Session Equipment Utilized During Treatment: Gait belt Activity Tolerance: Patient tolerated treatment well Patient left: in bed;with call bell/phone within reach;with family/visitor present;with SCD's reapplied Nurse Communication: Mobility status;Weight bearing status         Time: 1409-1440 PT Time Calculation (min) (ACUTE ONLY): 31 min   Charges:   PT Evaluation $PT Eval Moderate Complexity: 1 Procedure PT Treatments $Gait Training: 8-22 mins   PT G Codes:        Cassell Clement, PT, CSCS Pager [phone] Office [phone]  06/15/2016, 2:49 PM

## 2016-06-15 NOTE — H&P (View-Only) (Signed)
TOTAL HIP ADMISSION H&P  Patient is admitted for right total hip arthroplasty.  Subjective:  Chief Complaint: right hip pain  HPI: Jerome Shaffer, 60 y.o. male, has a history of pain and functional disability in the right hip(s) due to arthritis and patient has failed non-surgical conservative treatments for greater than 12 weeks to include NSAID's and/or analgesics, viscosupplementation injections, flexibility and strengthening excercises, use of assistive devices, weight reduction as appropriate and activity modification.  Onset of symptoms was gradual starting 1 years ago with gradually worsening course since that time.The patient noted no past surgery on the right hip(s).  Patient currently rates pain in the right hip at 10 out of 10 with activity. Patient has night pain, worsening of pain with activity and weight bearing, pain that interfers with activities of daily living and pain with passive range of motion. Patient has evidence of subchondral cysts, subchondral sclerosis, periarticular osteophytes and joint space narrowing by imaging studies. This condition presents safety issues increasing the risk of falls.  There is no current active infection.  Patient Active Problem List   Diagnosis Date Noted  . Primary osteoarthritis of left hip 04/27/2016   Past Medical History:  Diagnosis Date  . Arthritis   . Hypertension   . TIA (transient ischemic attack)     Past Surgical History:  Procedure Laterality Date  . APPENDECTOMY    . COLONOSCOPY    . ESOPHAGOGASTRODUODENOSCOPY    . EYE SURGERY     lasic, retina tear repair  . TOTAL HIP ARTHROPLASTY Left 04/27/2016   Procedure: LEFT TOTAL HIP ARTHROPLASTY ANTERIOR APPROACH;  Surgeon: Rod Can, MD;  Location: [facility];  Service: Orthopedics;  Laterality: Left;  Needs RNFA  . vasctectomy       (Not in a hospital admission) Allergies  Allergen Reactions  . No Known Allergies     Social History  Substance Use Topics  . Smoking  status: Never Smoker  . Smokeless tobacco: Never Used  . Alcohol use Yes     Comment: occasional    No family history on file.   Review of Systems  Constitutional: Negative.   HENT: Negative.   Eyes: Negative.   Respiratory: Negative.   Cardiovascular: Negative.   Gastrointestinal: Negative.   Genitourinary: Positive for frequency.  Musculoskeletal: Positive for back pain and joint pain.  Skin: Negative.   Neurological: Negative.   Endo/Heme/Allergies: Negative.   Psychiatric/Behavioral: Negative.     Objective:  Physical Exam  Vitals reviewed. Constitutional: He is oriented to person, place, and time. He appears well-developed.  HENT:  Head: Normocephalic and atraumatic.  Eyes: Conjunctivae and EOM are normal. Pupils are equal, round, and reactive to light.  Neck: Normal range of motion. Neck supple.  Cardiovascular: Normal rate, regular rhythm and intact distal pulses.   Respiratory: Effort normal. No respiratory distress.  GI: Soft. He exhibits no distension.  Genitourinary:  Genitourinary Comments: deferred  Musculoskeletal:       Right hip: He exhibits decreased range of motion and bony tenderness.       Legs: Neurological: He is alert and oriented to person, place, and time. He has normal reflexes.  Skin: Skin is warm and dry.  Psychiatric: He has a normal mood and affect. His behavior is normal. Judgment and thought content normal.    Vital signs in last 24 hours: @VSRANGES @  Labs:   Estimated body mass index is 25.73 kg/m as calculated from the following:   Height as of 04/27/16: 5' 5.5" (  1.664 m).   Weight as of 04/27/16: 71.2 kg (157 lb).   Imaging Review Plain radiographs demonstrate severe degenerative joint disease of the right hip(s). The bone quality appears to be adequate for age and reported activity level.  Assessment/Plan:  End stage arthritis, right hip(s)  The patient history, physical examination, clinical judgement of the provider  and imaging studies are consistent with end stage degenerative joint disease of the right hip(s) and total hip arthroplasty is deemed medically necessary. The treatment options including medical management, injection therapy, arthroscopy and arthroplasty were discussed at length. The risks and benefits of total hip arthroplasty were presented and reviewed. The risks due to aseptic loosening, infection, stiffness, dislocation/subluxation,  thromboembolic complications and other imponderables were discussed.  The patient acknowledged the explanation, agreed to proceed with the plan and consent was signed. Patient is being admitted for inpatient treatment for surgery, pain control, PT, OT, prophylactic antibiotics, VTE prophylaxis, progressive ambulation and ADL's and discharge planning.The patient is planning to be discharged home with home health services

## 2016-06-16 ENCOUNTER — Encounter (HOSPITAL_COMMUNITY): Payer: Self-pay | Admitting: Orthopedic Surgery

## 2016-06-16 LAB — CBC
HEMATOCRIT: 24.3 % — AB (ref 39.0–52.0)
HEMOGLOBIN: 8.2 g/dL — AB (ref 13.0–17.0)
MCH: 29.5 pg (ref 26.0–34.0)
MCHC: 33.7 g/dL (ref 30.0–36.0)
MCV: 87.4 fL (ref 78.0–100.0)
Platelets: 209 10*3/uL (ref 150–400)
RBC: 2.78 MIL/uL — AB (ref 4.22–5.81)
RDW: 14.3 % (ref 11.5–15.5)
WBC: 8.5 10*3/uL (ref 4.0–10.5)

## 2016-06-16 LAB — BASIC METABOLIC PANEL
Anion gap: 6 (ref 5–15)
BUN: 17 mg/dL (ref 6–20)
CHLORIDE: 108 mmol/L (ref 101–111)
CO2: 24 mmol/L (ref 22–32)
Calcium: 8.6 mg/dL — ABNORMAL LOW (ref 8.9–10.3)
Creatinine, Ser: 0.94 mg/dL (ref 0.61–1.24)
GFR calc non Af Amer: >60 mL/min (ref >60)
Glucose, Bld: 131 mg/dL — ABNORMAL HIGH (ref 65–99)
POTASSIUM: 4.5 mmol/L (ref 3.5–5.1)
SODIUM: 138 mmol/L (ref 135–145)

## 2016-06-16 MED ORDER — METHOCARBAMOL 500 MG PO TABS: 500.0000 mg | ORAL_TABLET | Freq: Four times a day (QID) | ORAL | 0 refills | Status: DC | PRN

## 2016-06-16 MED ORDER — HYDROCODONE-ACETAMINOPHEN 5-325 MG PO TABS: 1.0000 | ORAL_TABLET | ORAL | 0 refills | Status: DC | PRN

## 2016-06-16 MED ORDER — DOCUSATE SODIUM 100 MG PO CAPS
100.0000 mg | ORAL_CAPSULE | Freq: Two times a day (BID) | ORAL | 0 refills | Status: DC
Start: 2016-06-16 — End: 2021-09-15

## 2016-06-16 MED ORDER — SENNA 8.6 MG PO TABS: 2.0000 | ORAL_TABLET | Freq: Every day | ORAL | 0 refills | Status: DC

## 2016-06-16 NOTE — Progress Notes (Signed)
All education completed. Pt requires set up to min assist with ADL. Has all necessary equipment at home. No further OT needs.   06/16/16 0923  OT Visit Information  Last OT Received On 06/16/16  Assistance Needed +1  History of Present Illness 60 y.o. male now s/p direct anterior THA Rt. PMH: Lt THA, TIA.   Precautions  Precautions None  Restrictions  Weight Bearing Restrictions Yes  RLE Weight Bearing WBAT  Home Living  Family/patient expects to be discharged to: Private residence  Living Arrangements Spouse/significant other  Available Help at Discharge Family;Available 24 hours/day  Type of Home House  Home Access Stairs to enter  Entrance Stairs-Number of Steps 5  Entrance Stairs-Rails Left  Home Layout Two level  Alternate Level Stairs-Number of Steps 15  Alternate Level Stairs-Rails Right  Bathroom Shower/Tub Walk-in shower  Bathroom Toilet Handicapped height  Home Equipment Walker - 2 wheels;BSC  Additional Comments bedroom/bathroom on second level  Prior Function  Level of Independence Independent  Comments Pt using sock aide and long handled shoe horn for LB dressing prior to surgery.  Communication  Communication No difficulties  Pain Assessment  Pain Assessment Faces  Faces Pain Scale 2  Pain Location Rt hip  Pain Descriptors / Indicators Tightness;Discomfort  Pain Intervention(s) Monitored during session  Cognition  Arousal/Alertness Awake/alert  Behavior During Therapy WFL for tasks assessed/performed  Overall Cognitive Status Within Functional Limits for tasks assessed  Upper Extremity Assessment  Upper Extremity Assessment Overall WFL for tasks assessed  Lower Extremity Assessment  Lower Extremity Assessment Defer to PT evaluation  ADL  Overall ADL's  Needs assistance/impaired  Eating/Feeding Independent  Grooming Wash/dry hands;Modified independent  Upper Body Bathing Set up;Sitting  Lower Body Bathing Minimal assistance;Sit to/from  stand (recommended long handled bath sponge)  Upper Body Dressing  Set up;Sitting  Lower Body Dressing Minimal assistance;Sit to/from stand  Lower Body Dressing Details (indicate cue type and reason) pt is knowledgeable in use of AE, instructed to dress R LE first  Toilet Transfer Modified Independent;Ambulation;RW  Toileting- Clothing Manipulation and Hygiene Modified independent;Sit to/from stand  Tub/ Engineer, maintenance;Ambulation;3 in 1  Tub/Shower Transfer Details (indicate cue type and reason) educated in technique  General ADL Comments Educated in safe footwear and in transporting items with walker safely.  Vision- History  Patient Visual Report No change from baseline  Transfers  Overall transfer level Modified independent  Equipment used Rolling walker (2 wheeled)  OT - End of Session  Activity Tolerance Patient tolerated treatment well  Patient left in chair;with call bell/phone within reach;with family/visitor present  OT Assessment  OT Recommendation/Assessment Patient does not need any further OT services  OT Problem List Impaired balance (sitting and/or standing);Increased edema  OT Recommendation  Follow Up Recommendations No OT follow up  OT Equipment None recommended by OT  Acute Rehab OT Goals  Patient Stated Goal go home  OT Time Calculation  OT Start Time (ACUTE ONLY) 0906  OT Stop Time (ACUTE ONLY) 0924  OT Time Calculation (min) 18 min  OT General Charges  $OT Visit 1 Procedure  OT Evaluation  $OT Eval Low Complexity 1 Procedure  Written Expression  Dominant Hand Right  06/16/2016 Nestor Lewandowsky, OTR/L Pager: [phone]

## 2016-06-16 NOTE — Progress Notes (Signed)
Physical Therapy Treatment Patient Details Name: Jerome Shaffer MRN: [id number] DOB: 05-27-1956 Today's Date: 06/16/2016    History of Present Illness 60 y.o. male now s/p direct anterior THA Rt. PMH: Lt THA, TIA.     PT Comments    Patient is making good progress with PT.  From a mobility standpoint anticipate patient will be ready for DC home when medically ready.     Follow Up Recommendations  Home health PT;Supervision for mobility/OOB     Equipment Recommendations  None recommended by PT    Recommendations for Other Services       Precautions / Restrictions Precautions Precautions: None Restrictions Weight Bearing Restrictions: Yes RLE Weight Bearing: Weight bearing as tolerated    Mobility  Bed Mobility Overal bed mobility: Modified Independent Bed Mobility: Supine to Sit              Transfers Overall transfer level: Modified independent Equipment used: None Transfers: Sit to/from Stand              Ambulation/Gait Ambulation/Gait assistance: Supervision Ambulation Distance (Feet): 200 Feet Assistive device: Rolling walker (2 wheeled) Gait Pattern/deviations: Step-through pattern;Decreased step length - left;Decreased stride length Gait velocity: decreased   General Gait Details: slow, steady gait; cues for step length symmetry and L heel strike   Stairs Stairs: Yes   Stair Management: One rail Left;One rail Right;Forwards;Step to pattern Number of Stairs: 10 General stair comments: cues for sequencing/technique; supervision for safety  Wheelchair Mobility    Modified Rankin (Stroke Patients Only)       Balance Overall balance assessment: Needs assistance Sitting-balance support: No upper extremity supported Sitting balance-Leahy Scale: Good     Standing balance support: No upper extremity supported Standing balance-Leahy Scale: Fair Standing balance comment: static                    Cognition Arousal/Alertness:  Awake/alert Behavior During Therapy: WFL for tasks assessed/performed Overall Cognitive Status: Within Functional Limits for tasks assessed                      Exercises Total Joint Exercises Hip ABduction/ADduction: AROM;Right;15 reps;Standing Knee Flexion: AROM;Right;15 reps;Standing Marching in Standing: AROM;Both;15 reps;Standing    General Comments        Pertinent Vitals/Pain Pain Assessment: Faces Faces Pain Scale: Hurts a little bit Pain Location: Rt hip Pain Descriptors / Indicators: Tightness;Discomfort Pain Intervention(s): Limited activity within patient's tolerance;Monitored during session;Premedicated before session;Repositioned;Ice applied    Home Living                      Prior Function            PT Goals (current goals can now be found in the care plan section) Acute Rehab PT Goals Patient Stated Goal: go home PT Goal Formulation: With patient Time For Goal Achievement: 06/29/16 Potential to Achieve Goals: Good Progress towards PT goals: Progressing toward goals    Frequency    7X/week      PT Plan Current plan remains appropriate    Co-evaluation             End of Session Equipment Utilized During Treatment: Gait belt Activity Tolerance: Patient tolerated treatment well Patient left: with call bell/phone within reach;with family/visitor present;in chair     Time: [id number] PT Time Calculation (min) (ACUTE ONLY): 24 min  Charges:  $Gait Training: 8-22 mins $Therapeutic Exercise: 8-22 mins  G Codes:      Salina April, PTA Pager: [phone]   06/16/2016, 9:18 AM

## 2016-06-16 NOTE — Care Management Note (Signed)
Case Management Note  Patient Details  Name: Jerome Shaffer MRN: [id number] Date of Birth: 12-28-1955  Subjective/Objective:  60 yr old gentleman s/p right total hip arthroplasty.                   Action/Plan: Case manager spkoe with patient and his wife concerning Home health and DME needs. Patient was preoperatively setup with [facility], no changes. He is requesting the same therapist that he had with his previous hip surgery, but cannot remember the name. Case manager will contact [facility] [employer], Christa See. Patient has rolling walker and 3in1 at home. Will have family support.   Expected Discharge Date:   06/16/16               Expected Discharge Plan:  [place]  In-House Referral:  NA  Discharge planning Services  CM Consult  Post Acute Care Choice:  Home Health Choice offered to:  Patient, Spouse  DME Arranged:  N/A DME Agency:     HH Arranged:  PT [place] Agency:  [facility] (formerly [employer])  Status of Service:  Completed, signed off  If discussed at [employer], dates discussed:    Additional Comments:  Ninfa Meeker, RN 06/16/2016, 10:30 AM

## 2016-06-16 NOTE — Discharge Summary (Signed)
Physician Discharge Summary  Patient ID: Jerome Shaffer MRN: [id number] DOB/AGE: February 15, 1956 60 y.o.  Admit date: 06/15/2016 Discharge date: 06/16/2016  Admission Diagnoses:  Primary osteoarthritis of right hip  Discharge Diagnoses:  Principal Problem:   Primary osteoarthritis of right hip   Past Medical History:  Diagnosis Date  . Arthritis   . Hypertension   . Stroke ([place])   . TIA (transient ischemic attack)     Surgeries: Procedure(s): RIGHT TOTAL HIP ARTHROPLASTY ANTERIOR APPROACH on 06/15/2016   Consultants (if any):   Discharged Condition: Improved  Hospital Course: Jerome Shaffer is an 60 y.o. male who was admitted 06/15/2016 with a diagnosis of Primary osteoarthritis of right hip and went to the operating room on 06/15/2016 and underwent the above named procedures.    He was given perioperative antibiotics:  Anti-infectives    Start     Dose/Rate Route Frequency Ordered Stop   06/15/16 1330  ceFAZolin (ANCEF) IVPB 2g/100 mL premix     2 g 200 mL/hr over 30 Minutes Intravenous Every 6 hours 06/15/16 1201 06/15/16 2102   06/15/16 0700  ceFAZolin (ANCEF) IVPB 2g/100 mL premix     2 g 200 mL/hr over 30 Minutes Intravenous To ShortStay Surgical 06/12/16 1024 06/15/16 0733    .  He was given sequential compression devices, early ambulation, and ASA for DVT prophylaxis.  He benefited maximally from the hospital stay and there were no complications.    Recent vital signs:  Vitals:   06/15/16 2035 06/16/16 0601  BP: 106/67 (!) 111/48  Pulse: 85 71  Resp: 16 17  Temp: 98.6 F (37 C) 99 F (37.2 C)    Recent laboratory studies:  Lab Results  Component Value Date   HGB 8.2 (L) 06/16/2016   HGB 13.1 06/10/2016   HGB 10.9 (L) 04/28/2016   Lab Results  Component Value Date   WBC 8.5 06/16/2016   PLT 209 06/16/2016   No results found for: INR Lab Results  Component Value Date   NA 138 06/16/2016   K 4.5 06/16/2016   CL 108 06/16/2016   CO2 24  06/16/2016   BUN 17 06/16/2016   CREATININE 0.94 06/16/2016   GLUCOSE 131 (H) 06/16/2016    Discharge Medications:     Medication List    TAKE these medications   ADVIL PM 200-25 MG Caps Generic drug:  Ibuprofen-Diphenhydramine HCl Take 1-2 tablets by mouth at bedtime as needed (for pain/sleep.).   amLODipine 5 MG tablet Commonly known as:  NORVASC Take 5 mg by mouth daily.   aspirin 81 MG chewable tablet Chew 1 tablet (81 mg total) by mouth 2 (two) times daily.   atorvastatin 40 MG tablet Commonly known as:  LIPITOR Take 40 mg by mouth daily.   CoQ10 200 MG Caps Take 200 mg by mouth daily.   docusate sodium 100 MG capsule Commonly known as:  COLACE Take 1 capsule (100 mg total) by mouth 2 (two) times daily.   Fish Oil 1200 MG Caps Take 1,200 mg by mouth daily.   Flaxseed Oil 1000 MG Caps Take 1,000 mg by mouth daily.   HYDROcodone-acetaminophen 5-325 MG tablet Commonly known as:  NORCO/VICODIN Take 1-2 tablets by mouth every 4 (four) hours as needed (breakthrough pain).   methocarbamol 500 MG tablet Commonly known as:  ROBAXIN Take 1 tablet (500 mg total) by mouth every 6 (six) hours as needed for muscle spasms.   multivitamin with minerals Tabs tablet Take 1 tablet by  mouth daily. Multivitamin for 50+   naproxen sodium 220 MG tablet Commonly known as:  ANAPROX Take 440 mg by mouth 2 (two) times daily as needed (for pain.).   senna 8.6 MG Tabs tablet Commonly known as:  SENOKOT Take 2 tablets (17.2 mg total) by mouth at bedtime.   vitamin C 1000 MG tablet Take 1,000 mg by mouth daily.   VITAMIN E PO Take 1 capsule by mouth daily.       Diagnostic Studies: Dg Pelvis Portable  Result Date: 06/15/2016 CLINICAL DATA:  Right hip replacement EXAM: PORTABLE PELVIS 1-2 VIEWS COMPARISON:  None. FINDINGS: Interval right total hip arthroplasty without failure or complication. Postsurgical changes in the adjacent soft tissues. Total left hip arthroplasty  without failure or complication. IMPRESSION: Interval right total hip arthroplasty without failure or complication. Postsurgical changes in the adjacent soft tissues. Electronically Signed   By: Kathreen Devoid   On: 06/15/2016 13:14   Dg Hip Operative Unilat W Or W/o Pelvis Right  Result Date: 06/15/2016 CLINICAL DATA:  Status post right anterior approach hip replacement. Fluoro time reported is 22 seconds. EXAM: OPERATIVE right HIP (WITH PELVIS IF PERFORMED) 2 fluoro spot VIEWS TECHNIQUE: Fluoroscopic spot image(s) were submitted for interpretation post-operatively. COMPARISON:  AP pelvis dated April 27, 2016 FINDINGS: The patient has undergone anterior approach right total hip joint replacement. Radiographic positioning of the prosthetic components is good. The interface with the native bone is normal. The observed portions of the right hemipelvis exhibit no acute abnormalities. There is a pre-existing left total hip prosthesis. IMPRESSION: No immediate postprocedure complication following anterior approach right total hip joint prosthesis placement. Electronically Signed   By: David  Martinique M.D.   On: 06/15/2016 09:40    Disposition: 01-Home or Self Care  Discharge Instructions    Call MD / Call 911    Complete by:  As directed    If you experience chest pain or shortness of breath, CALL 911 and be transported to the hospital emergency room.  If you develope a fever above 101 F, pus (white drainage) or increased drainage or redness at the wound, or calf pain, call your surgeon's office.   Constipation Prevention    Complete by:  As directed    Drink plenty of fluids.  Prune juice may be helpful.  You may use a stool softener, such as Colace (over the counter) 100 mg twice a day.  Use MiraLax (over the counter) for constipation as needed.   Diet - low sodium heart healthy    Complete by:  As directed    Driving restrictions    Complete by:  As directed    No driving for 6 weeks   Increase  activity slowly as tolerated    Complete by:  As directed    Lifting restrictions    Complete by:  As directed    No lifting for 6 weeks   TED hose    Complete by:  As directed    Use stockings (TED hose) for 2 weeks on both leg(s).  You may remove them at night for sleeping.      Follow-up Information    Jenese Mischke, Horald Pollen, MD. Schedule an appointment as soon as possible for a visit in 2 weeks.   Specialty:  Orthopedic Surgery Why:  For wound re-check Contact information: [address] [address] [phone]            Signed: Elie Goody 06/16/2016, 5:27 PM

## 2016-06-16 NOTE — Progress Notes (Signed)
Pt stable for d/c home today per MD. Pt met PT/OT goals, has needed equipment at home. Discharge instructions and prescriptions reviewed with pt and his wife, all questions answered. Pt assisted to car via wheelchair. All belongings sent with pt.   [place], Jerry Caras

## 2016-06-16 NOTE — Progress Notes (Signed)
   Subjective:  Patient reports pain as mild to moderate.  Denies N/V/CP/SOB.  Objective:   VITALS:   Vitals:   06/15/16 1204 06/15/16 1410 06/15/16 2035 06/16/16 0601  BP: 112/66 115/63 106/67 (!) 111/48  Pulse: 76 77 85 71  Resp:   16 17  Temp: 98.2 F (36.8 C) 97.3 F (36.3 C) 98.6 F (37 C) 99 F (37.2 C)  TempSrc: Oral Oral Oral Oral  SpO2: 96% 100% 100% 96%    ABD soft Sensation intact distally Intact pulses distally Dorsiflexion/Plantar flexion intact Incision: dressing C/D/I Compartment soft   Lab Results  Component Value Date   WBC 8.5 06/16/2016   HGB 8.2 (L) 06/16/2016   HCT 24.3 (L) 06/16/2016   MCV 87.4 06/16/2016   PLT 209 06/16/2016   BMET    Component Value Date/Time   NA 138 06/16/2016 0513   K 4.5 06/16/2016 0513   CL 108 06/16/2016 0513   CO2 24 06/16/2016 0513   GLUCOSE 131 (H) 06/16/2016 0513   BUN 17 06/16/2016 0513   CREATININE 0.94 06/16/2016 0513   CALCIUM 8.6 (L) 06/16/2016 0513   GFRNONAA >60 06/16/2016 0513   GFRAA >60 06/16/2016 0513     Assessment/Plan: 1 Day Post-Op   Principal Problem:   Primary osteoarthritis of right hip   WBAT with walker DVT ppx: ASA, SCDs, TEDs PO pain control ABLA: asymptomatic, monitor PT/OT D/C home today   Elie Goody 06/16/2016, 7:55 AM   Rod Can, MD Cell [phone]

## 2016-06-23 ENCOUNTER — Encounter: Payer: Self-pay | Admitting: *Deleted

## 2016-06-23 ENCOUNTER — Other Ambulatory Visit: Payer: Self-pay | Admitting: *Deleted

## 2016-06-23 NOTE — Patient Outreach (Addendum)
[facility]) Care Management  06/23/2016  Jerome Shaffer 02/19/1956 [id number]   Subjective: Telephone call to patient's home phone and mobile mobile, HIPAA verified.   Discussed [facility] Care Management  [employer] Transition of care follow up, voiced understanding, and is in agreement to complete follow up.   Patient states he remembers speaking with this RNCM after last hip surgery. Patient states he is doing fair due left ear pain and pressure. States his hip is doing great and his left ear is giving him a fit.  States he has been to urgent care, consulted primary MD (Dr. Maury Dus)  through the patient portal regarding his ear, and has been told it may take some time to clear up.   States the ear has been stopped up since 06/08/16 and he also has a follow up appointment with ENT (Ear, Nose, Throat) MD on 07/02/16.  Patient is aware of signs and symptoms to follow up on sooner if needed. Has a follow up appointment with surgeon on 06/30/16.    Patient states he does not have any transition of care, care coordination, disease management, disease monitoring, transportation, community resource, or pharmacy needs at this time.   States he is very appreciative of the follow up call and is in agreement to receive [place] Management information.    Objective:  Per chart review: Patient hospitalized  06/15/16 - 06/16/16 for Primary osteoarthritis of right hip.   Status post RIGHT TOTAL HIP ARTHROPLASTY ANTERIOR APPROACH on 06/15/2016.    Last Transition of care follow up completed on 05/01/16.  Patient hospitalized 04/27/16 -04/28/16 for Primary osteoarthritis of left hip. Status post LEFT TOTAL HIP ARTHROPLASTY ANTERIOR APPROACH on10/23/2017. Patient also has a history of hypertension and TIA (transient ischemic attack).    Assessment:   Received [name] transition of care referral on 06/19/16.   Referral source: Wilhemina Cash at [place].    Transition of care follow up completed, no care  management needs, and will proceed with case closure.  Plan:  RNCM will send patient successful outreach letter, [facility] pamphlet, and magnet.  RNCM will send case closure due to follow up completed / no care management needs request to [name] [place] [facility].   Hurshell Dino H. Annia Friendly, BSN, [place] [facility] Telephonic CM Phone: [phone] Fax: [phone]

## 2019-04-07 ENCOUNTER — Other Ambulatory Visit: Payer: Self-pay

## 2019-04-07 DIAGNOSIS — Z20822 Contact with and (suspected) exposure to covid-19: Secondary | ICD-10-CM

## 2019-04-08 LAB — NOVEL CORONAVIRUS, NAA: SARS-CoV-2, NAA: NOT DETECTED

## 2020-09-17 DIAGNOSIS — Z7982 Long term (current) use of aspirin: Secondary | ICD-10-CM | POA: Diagnosis not present

## 2020-09-17 DIAGNOSIS — E785 Hyperlipidemia, unspecified: Secondary | ICD-10-CM | POA: Diagnosis not present

## 2020-09-17 DIAGNOSIS — I10 Essential (primary) hypertension: Secondary | ICD-10-CM | POA: Diagnosis not present

## 2020-09-17 DIAGNOSIS — Z8249 Family history of ischemic heart disease and other diseases of the circulatory system: Secondary | ICD-10-CM | POA: Diagnosis not present

## 2020-09-17 DIAGNOSIS — Z8673 Personal history of transient ischemic attack (TIA), and cerebral infarction without residual deficits: Secondary | ICD-10-CM | POA: Diagnosis not present

## 2020-10-04 DIAGNOSIS — Z0000 Encounter for general adult medical examination without abnormal findings: Secondary | ICD-10-CM | POA: Diagnosis not present

## 2020-10-04 DIAGNOSIS — N401 Enlarged prostate with lower urinary tract symptoms: Secondary | ICD-10-CM | POA: Diagnosis not present

## 2020-10-04 DIAGNOSIS — E7800 Pure hypercholesterolemia, unspecified: Secondary | ICD-10-CM | POA: Diagnosis not present

## 2020-10-04 DIAGNOSIS — R7303 Prediabetes: Secondary | ICD-10-CM | POA: Diagnosis not present

## 2020-10-04 DIAGNOSIS — I10 Essential (primary) hypertension: Secondary | ICD-10-CM | POA: Diagnosis not present

## 2020-10-04 DIAGNOSIS — Z125 Encounter for screening for malignant neoplasm of prostate: Secondary | ICD-10-CM | POA: Diagnosis not present

## 2021-01-09 ENCOUNTER — Encounter: Payer: Self-pay | Admitting: Family Medicine

## 2021-01-09 ENCOUNTER — Ambulatory Visit: Payer: Medicare HMO | Admitting: Family Medicine

## 2021-01-09 ENCOUNTER — Other Ambulatory Visit: Payer: Self-pay

## 2021-01-09 ENCOUNTER — Ambulatory Visit: Payer: Self-pay

## 2021-01-09 VITALS — BP 120/82 | Ht 65.5 in | Wt 146.0 lb

## 2021-01-09 DIAGNOSIS — S93699A Other sprain of unspecified foot, initial encounter: Secondary | ICD-10-CM

## 2021-01-09 DIAGNOSIS — M79671 Pain in right foot: Secondary | ICD-10-CM

## 2021-01-09 NOTE — Assessment & Plan Note (Signed)
History and ultrasound are most consistent with partial tear of the plantar fascia.  - counseled on home exercise therapy and supportive  - midfoot arch strap  - pursue shockwave therapy

## 2021-01-09 NOTE — Progress Notes (Signed)
  Jerome Shaffer - 65 y.o. male MRN [id number]  Date of birth: Feb 22, 1956  SUBJECTIVE:  Including CC & ROS.  No chief complaint on file.   Jerome Shaffer is a 65 y.o. male that is presenting with right heel pain.  The pain has been ongoing over the past few days.  Pain occurred after he was playing pickle ball and had a pushoff normal.  Having pain at the plantar aspect of his calcaneus.  Has had tenderness with weightbearing on the heel.  Has been using a single-point cane to help with ambulation.   Review of Systems See HPI   HISTORY: Past Medical, Surgical, Social, and Family History Reviewed & Updated per EMR.   Pertinent Historical Findings include:  Past Medical History:  Diagnosis Date   Arthritis    Hypertension    Stroke ([place])    TIA (transient ischemic attack)     Past Surgical History:  Procedure Laterality Date   APPENDECTOMY     COLONOSCOPY     ESOPHAGOGASTRODUODENOSCOPY     EYE SURGERY     lasic, retina tear repair   TOTAL HIP ARTHROPLASTY Left 04/27/2016   Procedure: LEFT TOTAL HIP ARTHROPLASTY ANTERIOR APPROACH;  Surgeon: Rod Can, MD;  Location: [place];  Service: Orthopedics;  Laterality: Left;  Needs RNFA   TOTAL HIP ARTHROPLASTY Right 06/15/2016   Procedure: RIGHT TOTAL HIP ARTHROPLASTY ANTERIOR APPROACH;  Surgeon: Rod Can, MD;  Location: [place];  Service: Orthopedics;  Laterality: Right;   vasctectomy      History reviewed. No pertinent family history.  Social History   Socioeconomic History   Marital status: Unknown    Spouse name: Not on file   Number of children: Not on file   Years of education: Not on file   Highest education level: Not on file  Occupational History   Not on file  Tobacco Use   Smoking status: Never   Smokeless tobacco: Never  Substance and Sexual Activity   Alcohol use: Yes    Comment: occasional   Drug use: No   Sexual activity: Yes  Other Topics Concern   Not on file  Social History Narrative    Not on file   Social Determinants of Health   Financial Resource Strain: Not on file  Food Insecurity: Not on file  Transportation Needs: Not on file  Physical Activity: Not on file  Stress: Not on file  Social Connections: Not on file  Intimate Partner Violence: Not on file     PHYSICAL EXAM:  VS: BP 120/82 (BP Location: Left Arm, Patient Position: Sitting, Cuff Size: Normal)   Ht 5' 5.5" (1.664 m)   Wt 146 lb (66.2 kg)   BMI 23.93 kg/m  Physical Exam Gen: NAD, alert, cooperative with exam, well-appearing MSK:  Right foot: Tenderness to palpation at the plantar calcaneus. No swelling or ecchymosis. Normal range of motion. Neurovascular intact  Limited ultrasound: Right heel:  Normal insertion of the acute normal-appearing Achilles tendon. Hypoechoic change at the origin of the plantar fascia with appears to be incomplete rupture Normal appearing mid plantar fascia  Summary: Partial tear of the plantar fashion  Ultrasound and interpretation by Clearance Coots, MD    ASSESSMENT & PLAN:   Traumatic plantar fasciitis History and ultrasound are most consistent with partial tear of the plantar fascia.  - counseled on home exercise therapy and supportive  - midfoot arch strap  - pursue shockwave therapy

## 2021-01-09 NOTE — Patient Instructions (Signed)
Nice to meet you Please try the exercises  Please try ice  Please schedule the shockwave  Please avoid walking barefoot   Please send me a message in [place] with any questions or updates.  Please see me back in 4 weeks.   --Dr. Raeford Razor

## 2021-01-13 ENCOUNTER — Other Ambulatory Visit: Payer: Self-pay

## 2021-01-13 ENCOUNTER — Encounter: Payer: Self-pay | Admitting: Family Medicine

## 2021-01-13 ENCOUNTER — Ambulatory Visit: Payer: Self-pay | Admitting: Family Medicine

## 2021-01-13 DIAGNOSIS — S93699A Other sprain of unspecified foot, initial encounter: Secondary | ICD-10-CM

## 2021-01-13 NOTE — Progress Notes (Signed)
  Jerome Shaffer - 65 y.o. male MRN [id number]  Date of birth: 07/20/1955  SUBJECTIVE:  Including CC & ROS.  No chief complaint on file.   Jerome Shaffer is a 65 y.o. male that is here for shockwave therapy.    Review of Systems See HPI   HISTORY: Past Medical, Surgical, Social, and Family History Reviewed & Updated per EMR.   Pertinent Historical Findings include:  Past Medical History:  Diagnosis Date   Arthritis    Hypertension    Stroke ([place])    TIA (transient ischemic attack)     Past Surgical History:  Procedure Laterality Date   APPENDECTOMY     COLONOSCOPY     ESOPHAGOGASTRODUODENOSCOPY     EYE SURGERY     lasic, retina tear repair   TOTAL HIP ARTHROPLASTY Left 04/27/2016   Procedure: LEFT TOTAL HIP ARTHROPLASTY ANTERIOR APPROACH;  Surgeon: Rod Can, MD;  Location: [place];  Service: Orthopedics;  Laterality: Left;  Needs RNFA   TOTAL HIP ARTHROPLASTY Right 06/15/2016   Procedure: RIGHT TOTAL HIP ARTHROPLASTY ANTERIOR APPROACH;  Surgeon: Rod Can, MD;  Location: [place];  Service: Orthopedics;  Laterality: Right;   vasctectomy      History reviewed. No pertinent family history.  Social History   Socioeconomic History   Marital status: Unknown    Spouse name: Not on file   Number of children: Not on file   Years of education: Not on file   Highest education level: Not on file  Occupational History   Not on file  Tobacco Use   Smoking status: Never   Smokeless tobacco: Never  Substance and Sexual Activity   Alcohol use: Yes    Comment: occasional   Drug use: No   Sexual activity: Yes  Other Topics Concern   Not on file  Social History Narrative   Not on file   Social Determinants of Health   Financial Resource Strain: Not on file  Food Insecurity: Not on file  Transportation Needs: Not on file  Physical Activity: Not on file  Stress: Not on file  Social Connections: Not on file  Intimate Partner Violence: Not on file      PHYSICAL EXAM:  VS: Ht 5' 5.5" (1.664 m)   Wt 146 lb (66.2 kg)   BMI 23.93 kg/m  Physical Exam Gen: NAD, alert, cooperative with exam, well-appearing    ECSWT Note PROSPERO MAHNKE 1955-10-15  Procedure: ECSWT Indications: right heel pain   Procedure Details Consent: Risks of procedure as well as the alternatives and risks of each were explained to the (patient/caregiver).  Consent for procedure obtained. Time Out: Verified patient identification, verified procedure, site/side was marked, verified correct patient position, special equipment/implants available, medications/allergies/relevent history reviewed, required imaging and test results available.  Performed.  The area was cleaned with iodine and alcohol swabs.    The right plantar fascia was targeted for Extracorporeal shockwave therapy.   Preset: plantar fasciitis  Power Level: 90 Frequency: 10 Impulse/cycles: 2500 Head size: large  Session: 1st   Patient did tolerate procedure well.      ASSESSMENT & PLAN:   Traumatic plantar fasciitis Completed for shockwave today.

## 2021-01-14 NOTE — Assessment & Plan Note (Signed)
Completed for shockwave today.

## 2021-01-17 ENCOUNTER — Ambulatory Visit: Payer: Self-pay | Admitting: Family Medicine

## 2021-01-17 ENCOUNTER — Other Ambulatory Visit: Payer: Self-pay

## 2021-01-17 ENCOUNTER — Encounter: Payer: Self-pay | Admitting: Family Medicine

## 2021-01-17 DIAGNOSIS — S93699A Other sprain of unspecified foot, initial encounter: Secondary | ICD-10-CM

## 2021-01-17 NOTE — Progress Notes (Signed)
  Jerome Shaffer - 65 y.o. male MRN [id number]  Date of birth: [birth year]  SUBJECTIVE:  Including CC & ROS.  No chief complaint on file.   Jerome Shaffer is a 65 y.o. male that is presenting for shockwave therapy.   Review of Systems See HPI   HISTORY: Past Medical, Surgical, Social, and Family History Reviewed & Updated per EMR.   Pertinent Historical Findings include:  Past Medical History:  Diagnosis Date   Arthritis    Hypertension    Stroke ([place])    TIA (transient ischemic attack)     Past Surgical History:  Procedure Laterality Date   APPENDECTOMY     COLONOSCOPY     ESOPHAGOGASTRODUODENOSCOPY     EYE SURGERY     lasic, retina tear repair   TOTAL HIP ARTHROPLASTY Left 04/27/2016   Procedure: LEFT TOTAL HIP ARTHROPLASTY ANTERIOR APPROACH;  Surgeon: Rod Can, MD;  Location: [place];  Service: Orthopedics;  Laterality: Left;  Needs RNFA   TOTAL HIP ARTHROPLASTY Right 06/15/2016   Procedure: RIGHT TOTAL HIP ARTHROPLASTY ANTERIOR APPROACH;  Surgeon: Rod Can, MD;  Location: [place];  Service: Orthopedics;  Laterality: Right;   vasctectomy      History reviewed. No pertinent family history.  Social History   Socioeconomic History   Marital status: Unknown    Spouse name: Not on file   Number of children: Not on file   Years of education: Not on file   Highest education level: Not on file  Occupational History   Not on file  Tobacco Use   Smoking status: Never   Smokeless tobacco: Never  Substance and Sexual Activity   Alcohol use: Yes    Comment: occasional   Drug use: No   Sexual activity: Yes  Other Topics Concern   Not on file  Social History Narrative   Not on file   Social Determinants of Health   Financial Resource Strain: Not on file  Food Insecurity: Not on file  Transportation Needs: Not on file  Physical Activity: Not on file  Stress: Not on file  Social Connections: Not on file  Intimate Partner Violence: Not on file      PHYSICAL EXAM:  VS: Ht 5' 5.5" (1.664 m)   Wt 146 lb (66.2 kg)   BMI 23.93 kg/m  Physical Exam Gen: NAD, alert, cooperative with exam, well-appearing   ECSWT Note Jerome Shaffer 02-13-1956  Procedure: ECSWT Indications: right foot pain   Procedure Details Consent: Risks of procedure as well as the alternatives and risks of each were explained to the (patient/caregiver).  Consent for procedure obtained. Time Out: Verified patient identification, verified procedure, site/side was marked, verified correct patient position, special equipment/implants available, medications/allergies/relevent history reviewed, required imaging and test results available.  Performed.  The area was cleaned with iodine and alcohol swabs.    The right plantar fascia was targeted for Extracorporeal shockwave therapy.   Preset: Planter fasciitis Power Level: 100 Frequency: 10 Impulse/cycles: 2800 Head size: Large Session: Second  Patient did tolerate procedure well.     ASSESSMENT & PLAN:   Traumatic plantar fasciitis Completed shockwave therapy today.

## 2021-01-17 NOTE — Assessment & Plan Note (Signed)
Completed shockwave therapy today.  

## 2021-01-29 ENCOUNTER — Encounter: Payer: Self-pay | Admitting: Family Medicine

## 2021-01-29 ENCOUNTER — Ambulatory Visit: Payer: Self-pay | Admitting: Family Medicine

## 2021-01-29 ENCOUNTER — Other Ambulatory Visit: Payer: Self-pay

## 2021-01-29 DIAGNOSIS — S93699A Other sprain of unspecified foot, initial encounter: Secondary | ICD-10-CM

## 2021-01-29 NOTE — Progress Notes (Signed)
  Jerome Shaffer - 66 y.o. male MRN [id number]  Date of birth: [birth year]  SUBJECTIVE:  Including CC & ROS.  No chief complaint on file.   Jerome Shaffer is a 65 y.o. male that is  here for shockwave therapy.   Review of Systems See HPI   HISTORY: Past Medical, Surgical, Social, and Family History Reviewed & Updated per EMR.   Pertinent Historical Findings include:  Past Medical History:  Diagnosis Date   Arthritis    Hypertension    Stroke ([place])    TIA (transient ischemic attack)     Past Surgical History:  Procedure Laterality Date   APPENDECTOMY     COLONOSCOPY     ESOPHAGOGASTRODUODENOSCOPY     EYE SURGERY     lasic, retina tear repair   TOTAL HIP ARTHROPLASTY Left 04/27/2016   Procedure: LEFT TOTAL HIP ARTHROPLASTY ANTERIOR APPROACH;  Surgeon: Rod Can, MD;  Location: [place];  Service: Orthopedics;  Laterality: Left;  Needs RNFA   TOTAL HIP ARTHROPLASTY Right 06/15/2016   Procedure: RIGHT TOTAL HIP ARTHROPLASTY ANTERIOR APPROACH;  Surgeon: Rod Can, MD;  Location: [place];  Service: Orthopedics;  Laterality: Right;   vasctectomy      History reviewed. No pertinent family history.  Social History   Socioeconomic History   Marital status: Unknown    Spouse name: Not on file   Number of children: Not on file   Years of education: Not on file   Highest education level: Not on file  Occupational History   Not on file  Tobacco Use   Smoking status: Never   Smokeless tobacco: Never  Substance and Sexual Activity   Alcohol use: Yes    Comment: occasional   Drug use: No   Sexual activity: Yes  Other Topics Concern   Not on file  Social History Narrative   Not on file   Social Determinants of Health   Financial Resource Strain: Not on file  Food Insecurity: Not on file  Transportation Needs: Not on file  Physical Activity: Not on file  Stress: Not on file  Social Connections: Not on file  Intimate Partner Violence: Not on file      PHYSICAL EXAM:  VS: Ht 5' 5.5" (1.664 m)   Wt 146 lb (66.2 kg)   BMI 23.93 kg/m  Physical Exam Gen: NAD, alert, cooperative with exam, well-appearing   ECSWT Note Jerome Shaffer [birth year]  Procedure: ECSWT Indications: right heel pain   Procedure Details Consent: Risks of procedure as well as the alternatives and risks of each were explained to the (patient/caregiver).  Consent for procedure obtained. Time Out: Verified patient identification, verified procedure, site/side was marked, verified correct patient position, special equipment/implants available, medications/allergies/relevent history reviewed, required imaging and test results available.  Performed.  The area was cleaned with iodine and alcohol swabs.    The right heel was targeted for Extracorporeal shockwave therapy.   Preset: Planter fasciitis Power Level: 110 Frequency: 10 Impulse/cycles: 3200 Head size: Large Session: Third  Patient did tolerate procedure well.    ASSESSMENT & PLAN:   Traumatic plantar fasciitis Completed shockwave today

## 2021-01-29 NOTE — Assessment & Plan Note (Signed)
Completed shockwave today. 

## 2021-04-08 DIAGNOSIS — N401 Enlarged prostate with lower urinary tract symptoms: Secondary | ICD-10-CM | POA: Diagnosis not present

## 2021-04-08 DIAGNOSIS — E7800 Pure hypercholesterolemia, unspecified: Secondary | ICD-10-CM | POA: Diagnosis not present

## 2021-04-08 DIAGNOSIS — I10 Essential (primary) hypertension: Secondary | ICD-10-CM | POA: Diagnosis not present

## 2021-04-08 DIAGNOSIS — R7303 Prediabetes: Secondary | ICD-10-CM | POA: Diagnosis not present

## 2021-04-09 DIAGNOSIS — E875 Hyperkalemia: Secondary | ICD-10-CM | POA: Diagnosis not present

## 2021-08-12 DIAGNOSIS — H5203 Hypermetropia, bilateral: Secondary | ICD-10-CM | POA: Diagnosis not present

## 2021-09-03 ENCOUNTER — Ambulatory Visit: Payer: Self-pay

## 2021-09-03 ENCOUNTER — Encounter: Payer: Self-pay | Admitting: Family Medicine

## 2021-09-03 ENCOUNTER — Ambulatory Visit (INDEPENDENT_AMBULATORY_CARE_PROVIDER_SITE_OTHER): Payer: Medicare HMO | Admitting: Family Medicine

## 2021-09-03 VITALS — BP 140/78 | Ht 65.5 in | Wt 136.0 lb

## 2021-09-03 DIAGNOSIS — S76011A Strain of muscle, fascia and tendon of right hip, initial encounter: Secondary | ICD-10-CM | POA: Diagnosis not present

## 2021-09-03 DIAGNOSIS — R1031 Right lower quadrant pain: Secondary | ICD-10-CM

## 2021-09-03 MED ORDER — METHYLPREDNISOLONE ACETATE 40 MG/ML IJ SUSP
40.0000 mg | Freq: Once | INTRAMUSCULAR | Status: AC
  Administered 2021-09-03: 40 mg via INTRA_ARTICULAR

## 2021-09-03 NOTE — Patient Instructions (Signed)
Good to see you ?Please try heat  ?Please try the exercises once the pain has improved   ?Please send me a message in MyChart with any questions or updates.  ?Please see me back to start shockwave therapy.  ? ?--Dr. Raeford Razor ? ?

## 2021-09-03 NOTE — Assessment & Plan Note (Signed)
Acutely occurring.  Injury occurred a couple days ago.  Obvious defect of the mid belly of the adductor longus was appreciated. ?-Counseled on home exercise therapy and supportive care. ?-Injection today. ?-Counseled on compression. ?-Pursue shockwave therapy. ?

## 2021-09-03 NOTE — Progress Notes (Signed)
?  Jerome Shaffer - 66 y.o. male MRN [id number]  Date of birth: [birth year] ? ?SUBJECTIVE:  Including CC & ROS.  ?No chief complaint on file. ? ? ?TOD Jerome Shaffer is a 66 y.o. male that is presenting with acute right groin pain.  He was playing pickle ball and felt a sharp pain in his adductor muscle.  Has had swelling since that time.  Has been taking ibuprofen.  No significant bruising today. ? ? ?Review of Systems ?See HPI  ? ?HISTORY: Past Medical, Surgical, Social, and Family History Reviewed & Updated per EMR.   ?Pertinent Historical Findings include: ? ?Past Medical History:  ?Diagnosis Date  ? Arthritis   ? Hypertension   ? Stroke Jerome Shaffer)   ? TIA (transient ischemic attack)   ? ? ?Past Surgical History:  ?Procedure Laterality Date  ? APPENDECTOMY    ? COLONOSCOPY    ? ESOPHAGOGASTRODUODENOSCOPY    ? EYE SURGERY    ? lasic, retina tear repair  ? TOTAL HIP ARTHROPLASTY Left 04/27/2016  ? Procedure: LEFT TOTAL HIP ARTHROPLASTY ANTERIOR APPROACH;  Surgeon: Rod Can, MD;  Location: [place];  Service: Orthopedics;  Laterality: Left;  Needs RNFA  ? TOTAL HIP ARTHROPLASTY Right 06/15/2016  ? Procedure: RIGHT TOTAL HIP ARTHROPLASTY ANTERIOR APPROACH;  Surgeon: Rod Can, MD;  Location: [place];  Service: Orthopedics;  Laterality: Right;  ? vasctectomy    ? ? ? ?PHYSICAL EXAM:  ?VS: BP 140/78 (BP Location: Left Arm, Patient Position: Sitting)   Ht 5' 5.5" (1.664 m)   Wt 136 lb (61.7 kg)   BMI 22.29 kg/m?  ?Physical Exam ?Gen: NAD, alert, cooperative with exam, well-appearing ?MSK:  ?Neurovascularly intact   ? ?Limited ultrasound: Right thigh: ? ?No changes at the insertion of the adductor muscles. ?There is a irregularity of the adductor longus muscle in the mid belly.  There is increased hyperemia in this area.   ?No changes of the adductor brevis or Magness ?Normal-appearing postsurgical hardware in the right hip ? ?Summary: Adductor longus strain ? ?Ultrasound and interpretation by Clearance Coots,  MD ? ? ?Aspiration/Injection Procedure Note ?Jerome Shaffer ?October 24, 1955 ? ?Procedure: Injection ?Indications: Right groin pain ? ?Procedure Details ?Consent: Risks of procedure as well as the alternatives and risks of each were explained to the (patient/caregiver).  Consent for procedure obtained. ?Time Out: Verified patient identification, verified procedure, site/side was marked, verified correct patient position, special equipment/implants available, medications/allergies/relevent history reviewed, required imaging and test results available.  Performed.  The area was cleaned with iodine and alcohol swabs.   ? ?The right adductor longus was injected using 1 cc's of 40 mg Depo-Medrol and 2 cc's of 0.25% bupivacaine with a 25 1 1/2" needle.  Ultrasound was used. Images were obtained in short views showing the injection.   ? ? ?A sterile dressing was applied. ? ?Patient did tolerate procedure well. ? ? ? ?ASSESSMENT & PLAN:  ? ?Strain of right hip adductor muscle ?Acutely occurring.  Injury occurred a couple days ago.  Obvious defect of the mid belly of the adductor longus was appreciated. ?-Counseled on home exercise therapy and supportive care. ?-Injection today. ?-Counseled on compression. ?-Pursue shockwave therapy. ? ? ? ? ?

## 2021-09-09 DIAGNOSIS — J069 Acute upper respiratory infection, unspecified: Secondary | ICD-10-CM | POA: Diagnosis not present

## 2021-09-09 DIAGNOSIS — N529 Male erectile dysfunction, unspecified: Secondary | ICD-10-CM | POA: Diagnosis not present

## 2021-09-09 DIAGNOSIS — R509 Fever, unspecified: Secondary | ICD-10-CM | POA: Diagnosis not present

## 2021-09-09 DIAGNOSIS — R0981 Nasal congestion: Secondary | ICD-10-CM | POA: Diagnosis not present

## 2021-09-09 DIAGNOSIS — S86911A Strain of unspecified muscle(s) and tendon(s) at lower leg level, right leg, initial encounter: Secondary | ICD-10-CM | POA: Diagnosis not present

## 2021-09-09 DIAGNOSIS — R3915 Urgency of urination: Secondary | ICD-10-CM | POA: Diagnosis not present

## 2021-09-09 DIAGNOSIS — Z03818 Encounter for observation for suspected exposure to other biological agents ruled out: Secondary | ICD-10-CM | POA: Diagnosis not present

## 2021-09-15 ENCOUNTER — Ambulatory Visit (INDEPENDENT_AMBULATORY_CARE_PROVIDER_SITE_OTHER): Payer: Medicare HMO | Admitting: Family Medicine

## 2021-09-15 DIAGNOSIS — S76011D Strain of muscle, fascia and tendon of right hip, subsequent encounter: Secondary | ICD-10-CM

## 2021-09-15 NOTE — Assessment & Plan Note (Signed)
Completed shockwave therapy  

## 2021-09-15 NOTE — Progress Notes (Signed)
?  Jerome Shaffer - 66 y.o. male MRN [id number]  Date of birth: 24-Jul-1955 ? ?SUBJECTIVE:  Including CC & ROS.  ?No chief complaint on file. ? ? ?Jerome Shaffer is a 66 y.o. male that is here for shockwave therapy. ? ? ? ?Review of Systems ?See HPI  ? ?HISTORY: Past Medical, Surgical, Social, and Family History Reviewed & Updated per EMR.   ?Pertinent Historical Findings include: ? ?Past Medical History:  ?Diagnosis Date  ? Arthritis   ? Hypertension   ? Stroke [facility])   ? TIA (transient ischemic attack)   ? ? ?Past Surgical History:  ?Procedure Laterality Date  ? APPENDECTOMY    ? COLONOSCOPY    ? ESOPHAGOGASTRODUODENOSCOPY    ? EYE SURGERY    ? lasic, retina tear repair  ? TOTAL HIP ARTHROPLASTY Left 04/27/2016  ? Procedure: LEFT TOTAL HIP ARTHROPLASTY ANTERIOR APPROACH;  Surgeon: Rod Can, MD;  Location: [place];  Service: Orthopedics;  Laterality: Left;  Needs RNFA  ? TOTAL HIP ARTHROPLASTY Right 06/15/2016  ? Procedure: RIGHT TOTAL HIP ARTHROPLASTY ANTERIOR APPROACH;  Surgeon: Rod Can, MD;  Location: [place];  Service: Orthopedics;  Laterality: Right;  ? vasctectomy    ? ? ? ?PHYSICAL EXAM:  ?VS: Ht 5' 5.5" (1.664 m)   BMI 22.29 kg/m?  ?Physical Exam ?Gen: NAD, alert, cooperative with exam, well-appearing ?MSK:  ?Neurovascularly intact   ? ?ECSWT Note ?Jerome Shaffer ?09/02/1955 ? ?Procedure: ECSWT ?Indications: right groin pain  ? ?Procedure Details ?Consent: Risks of procedure as well as the alternatives and risks of each were explained to the (patient/caregiver).  Consent for procedure obtained. ?Time Out: Verified patient identification, verified procedure, site/side was marked, verified correct patient position, special equipment/implants available, medications/allergies/relevent history reviewed, required imaging and test results available.  Performed.  The area was cleaned with iodine and alcohol swabs.   ? ?The right groin was targeted for Extracorporeal shockwave therapy.  ? ?Preset:  muscle injury  ?Power Level: 90  ?Frequency: 10 ?Impulse/cycles: 2200 ?Head size: medium  ?Session: 1st ? ?Patient did tolerate procedure well. ? ? ? ?ASSESSMENT & PLAN:  ? ?Strain of right hip adductor muscle ?Completed shockwave therapy.  ? ? ? ? ?

## 2021-09-18 DIAGNOSIS — Z96649 Presence of unspecified artificial hip joint: Secondary | ICD-10-CM | POA: Diagnosis not present

## 2021-09-18 DIAGNOSIS — Z7982 Long term (current) use of aspirin: Secondary | ICD-10-CM | POA: Diagnosis not present

## 2021-09-18 DIAGNOSIS — Z8249 Family history of ischemic heart disease and other diseases of the circulatory system: Secondary | ICD-10-CM | POA: Diagnosis not present

## 2021-09-18 DIAGNOSIS — Z008 Encounter for other general examination: Secondary | ICD-10-CM | POA: Diagnosis not present

## 2021-09-18 DIAGNOSIS — I10 Essential (primary) hypertension: Secondary | ICD-10-CM | POA: Diagnosis not present

## 2021-09-18 DIAGNOSIS — N529 Male erectile dysfunction, unspecified: Secondary | ICD-10-CM | POA: Diagnosis not present

## 2021-09-18 DIAGNOSIS — Z8673 Personal history of transient ischemic attack (TIA), and cerebral infarction without residual deficits: Secondary | ICD-10-CM | POA: Diagnosis not present

## 2021-09-18 DIAGNOSIS — Z803 Family history of malignant neoplasm of breast: Secondary | ICD-10-CM | POA: Diagnosis not present

## 2021-09-29 ENCOUNTER — Ambulatory Visit (INDEPENDENT_AMBULATORY_CARE_PROVIDER_SITE_OTHER): Payer: Self-pay | Admitting: Family Medicine

## 2021-09-29 ENCOUNTER — Encounter: Payer: Self-pay | Admitting: Family Medicine

## 2021-09-29 DIAGNOSIS — S76011D Strain of muscle, fascia and tendon of right hip, subsequent encounter: Secondary | ICD-10-CM

## 2021-09-29 NOTE — Assessment & Plan Note (Signed)
Completed shockwave therapy  

## 2021-09-29 NOTE — Progress Notes (Signed)
?  Jerome Shaffer - 66 y.o. male MRN [id number]  Date of birth: 02-19-1956 ? ?SUBJECTIVE:  Including CC & ROS.  ?No chief complaint on file. ? ? ?Jerome Shaffer is a 66 y.o. male that is here for shockwave therapy. ? ? ?Review of Systems ?See HPI  ? ?HISTORY: Past Medical, Surgical, Social, and Family History Reviewed & Updated per EMR.   ?Pertinent Historical Findings include: ? ?Past Medical History:  ?Diagnosis Date  ? Arthritis   ? Hypertension   ? Stroke [facility])   ? TIA (transient ischemic attack)   ? ? ?Past Surgical History:  ?Procedure Laterality Date  ? APPENDECTOMY    ? COLONOSCOPY    ? ESOPHAGOGASTRODUODENOSCOPY    ? EYE SURGERY    ? lasic, retina tear repair  ? TOTAL HIP ARTHROPLASTY Left 04/27/2016  ? Procedure: LEFT TOTAL HIP ARTHROPLASTY ANTERIOR APPROACH;  Surgeon: Rod Can, MD;  Location: [place];  Service: Orthopedics;  Laterality: Left;  Needs RNFA  ? TOTAL HIP ARTHROPLASTY Right 06/15/2016  ? Procedure: RIGHT TOTAL HIP ARTHROPLASTY ANTERIOR APPROACH;  Surgeon: Rod Can, MD;  Location: [place];  Service: Orthopedics;  Laterality: Right;  ? vasctectomy    ? ? ? ?PHYSICAL EXAM:  ?VS: Ht 5' 5.5" (1.664 m)   Wt 136 lb (61.7 kg)   BMI 22.29 kg/m?  ?Physical Exam ?Gen: NAD, alert, cooperative with exam, well-appearing ?MSK:  ?Neurovascularly intact   ? ?ECSWT Note ?Jerome Shaffer ?[birth year] ? ?Procedure: ECSWT ?Indications: right groin pain ? ?Procedure Details ?Consent: Risks of procedure as well as the alternatives and risks of each were explained to the (patient/caregiver).  Consent for procedure obtained. ?Time Out: Verified patient identification, verified procedure, site/side was marked, verified correct patient position, special equipment/implants available, medications/allergies/relevent history reviewed, required imaging and test results available.  Performed.  The area was cleaned with iodine and alcohol swabs.   ? ?The right groin was targeted for Extracorporeal shockwave  therapy.  ? ?Preset: muscle injury  ?Power Level: 90 ?Frequency: 10 ?Impulse/cycles: 2600 ?Head size: medium  ?Session: 2nd ? ?Patient did tolerate procedure well. ? ? ? ?ASSESSMENT & PLAN:  ? ?Strain of right hip adductor muscle ?Completed shockwave therapy.  ? ? ? ? ?

## 2021-10-27 ENCOUNTER — Ambulatory Visit (HOSPITAL_BASED_OUTPATIENT_CLINIC_OR_DEPARTMENT_OTHER)
Admission: RE | Admit: 2021-10-27 | Discharge: 2021-10-27 | Disposition: A | Discharge status: Home / Self Care | Payer: Medicare HMO | Source: Ambulatory Visit | Attending: Family Medicine | Admitting: Family Medicine

## 2021-10-27 ENCOUNTER — Ambulatory Visit: Payer: Medicare HMO | Admitting: Family Medicine

## 2021-10-27 ENCOUNTER — Ambulatory Visit: Payer: Self-pay

## 2021-10-27 VITALS — BP 130/80 | Ht 65.0 in | Wt 133.0 lb

## 2021-10-27 DIAGNOSIS — Z471 Aftercare following joint replacement surgery: Secondary | ICD-10-CM | POA: Diagnosis not present

## 2021-10-27 DIAGNOSIS — R1030 Lower abdominal pain, unspecified: Secondary | ICD-10-CM | POA: Diagnosis not present

## 2021-10-27 DIAGNOSIS — R1031 Right lower quadrant pain: Secondary | ICD-10-CM

## 2021-10-27 DIAGNOSIS — Z96641 Presence of right artificial hip joint: Secondary | ICD-10-CM | POA: Diagnosis not present

## 2021-10-27 DIAGNOSIS — S3981XA Other specified injuries of abdomen, initial encounter: Secondary | ICD-10-CM | POA: Insufficient documentation

## 2021-10-27 NOTE — Progress Notes (Signed)
?  Jerome Shaffer - 66 y.o. male MRN [id number]  Date of birth: 07/31/1955 ? ?SUBJECTIVE:  Including CC & ROS.  ?No chief complaint on file. ? ? ?Jerome Shaffer is a 66 y.o. male that is presenting with acute right groin pain.  He was playing pickle ball and felt a pop.  Since that time he has had severe pain in the groin.  He is unable to bear weight and has limited mobility of the right hip.  Does have a history of right total hip arthroplasty. ? ? ? ?Review of Systems ?See HPI  ? ?HISTORY: Past Medical, Surgical, Social, and Family History Reviewed & Updated per EMR.   ?Pertinent Historical Findings include: ? ?Past Medical History:  ?Diagnosis Date  ? Arthritis   ? Hypertension   ? Stroke [facility])   ? TIA (transient ischemic attack)   ? ? ?Past Surgical History:  ?Procedure Laterality Date  ? APPENDECTOMY    ? COLONOSCOPY    ? ESOPHAGOGASTRODUODENOSCOPY    ? EYE SURGERY    ? lasic, retina tear repair  ? TOTAL HIP ARTHROPLASTY Left 04/27/2016  ? Procedure: LEFT TOTAL HIP ARTHROPLASTY ANTERIOR APPROACH;  Surgeon: Rod Can, MD;  Location: [place];  Service: Orthopedics;  Laterality: Left;  Needs RNFA  ? TOTAL HIP ARTHROPLASTY Right 06/15/2016  ? Procedure: RIGHT TOTAL HIP ARTHROPLASTY ANTERIOR APPROACH;  Surgeon: Rod Can, MD;  Location: [place];  Service: Orthopedics;  Laterality: Right;  ? vasctectomy    ? ? ? ?PHYSICAL EXAM:  ?VS: BP 130/80   Ht '5\' 5"'$  (1.651 m)   Wt 133 lb (60.3 kg)   BMI 22.13 kg/m?  ?Physical Exam ?Gen: NAD, alert, cooperative with exam, well-appearing ?MSK:  ?Right groin: ?Obvious defect in the right inguinal canal. ?Tenderness to palpation at the pubic symphysis on the right. ?Pain with resistance to abduction as well as hip flexion ?Neurovascularly intact   ? ?Limited ultrasound: Right groin: ? ?There appears to be tendon rupture of the adductor longus at the pubic symphysis.  There does appear to be calcification in this area which could indicate bony involvement. ? ?Summary:  Tendon rupture at the origin ? ?Ultrasound and interpretation by Clearance Coots, MD ? ? ? ?ASSESSMENT & PLAN:  ? ?Sports hernia ?Acutely occurring.  Had an injury a few days ago and now has subsequent severe pain.  There appears to be a tendon rupture off of the pubic symphysis with discontinuation into the the rectus abdominis.  Concern for sports hernia versus tendon rupture in this area.  Has severe pain and limited mobility. ?-Counseled on home exercise therapy and supportive care. ?-X-ray. ?-MRI of the pelvis to evaluate for sports hernia, tendon rupture and for presurgical planning. ? ? ? ? ?

## 2021-10-27 NOTE — Patient Instructions (Signed)
Good to see you ?Please try heat  ?Please use tylenol  ?I will call with the xray results.  ?We'll get the MRI at [place]   ?Please send me a message in MyChart with any questions or updates.  ?We'll setup a virtual visit once the MRI is resulted.  ? ?--Dr. Raeford Razor ? ?

## 2021-10-27 NOTE — Assessment & Plan Note (Signed)
Acutely occurring.  Had an injury a few days ago and now has subsequent severe pain.  There appears to be a tendon rupture off of the pubic symphysis with discontinuation into the the rectus abdominis.  Concern for sports hernia versus tendon rupture in this area.  Has severe pain and limited mobility. ?-Counseled on home exercise therapy and supportive care. ?-X-ray. ?-MRI of the pelvis to evaluate for sports hernia, tendon rupture and for presurgical planning. ?

## 2021-10-28 ENCOUNTER — Telehealth: Payer: Self-pay | Admitting: Family Medicine

## 2021-10-28 NOTE — Telephone Encounter (Signed)
Forwarded call to med asst re: Pt's MRI orders @ [facility] that need clarification. ? ?--glh ?

## 2021-10-28 NOTE — Telephone Encounter (Signed)
I spoke to Jerome Shaffer and Dr. Raeford Razor- the MRI order is correct. Dr. Raeford Razor clarified with the radiologist at [facility] on 10/27/21. Patient is aware. He will contact GI to schedule.  ?

## 2021-10-30 ENCOUNTER — Encounter: Payer: Self-pay | Admitting: Family Medicine

## 2021-10-30 ENCOUNTER — Telehealth: Payer: Self-pay | Admitting: Family Medicine

## 2021-10-30 NOTE — Telephone Encounter (Signed)
Informed of results.  ? ?Rosemarie Ax, MD ?[facility] ?10/30/2021, 1:12 PM ? ?

## 2021-11-01 ENCOUNTER — Ambulatory Visit
Admission: RE | Admit: 2021-11-01 | Discharge: 2021-11-01 | Disposition: A | Discharge status: Home / Self Care | Payer: Medicare HMO | Source: Ambulatory Visit | Attending: Family Medicine | Admitting: Family Medicine

## 2021-11-01 DIAGNOSIS — Z96643 Presence of artificial hip joint, bilateral: Secondary | ICD-10-CM | POA: Diagnosis not present

## 2021-11-01 DIAGNOSIS — K4090 Unilateral inguinal hernia, without obstruction or gangrene, not specified as recurrent: Secondary | ICD-10-CM | POA: Diagnosis not present

## 2021-11-01 DIAGNOSIS — S3981XA Other specified injuries of abdomen, initial encounter: Secondary | ICD-10-CM

## 2021-11-05 ENCOUNTER — Telehealth (INDEPENDENT_AMBULATORY_CARE_PROVIDER_SITE_OTHER): Payer: Medicare HMO | Admitting: Family Medicine

## 2021-11-05 DIAGNOSIS — S3981XD Other specified injuries of abdomen, subsequent encounter: Secondary | ICD-10-CM | POA: Diagnosis not present

## 2021-11-05 NOTE — Assessment & Plan Note (Signed)
MRI was revealing for a disruption of the tendon insertion of the longus. ?-Counseled on home exercise therapy and supportive care. ?-Referral to orthopedic surgeon. ?

## 2021-11-05 NOTE — Progress Notes (Signed)
Virtual Visit via Video Note ? ?I connected with Jerome Shaffer on 11/05/21 at  1:00 PM EDT by a video enabled telemedicine application and verified that I am speaking with the correct person using two identifiers. ? ?Location: ?Patient: vehicle ?Provider: office ?  ?I discussed the limitations of evaluation and management by telemedicine and the availability of in person appointments. The patient expressed understanding and agreed to proceed. ? ?History of Present Illness: ? ?[name] is a 66 year old male that is following up after the MRI of his pelvis.  This was showing disruption of the tendinous insertion of the right adductor longus with retraction of the tendon. ?  ?Observations/Objective: ? ? ?Assessment and Plan: ? ?Adductor longus tendon tear:  ?MRI was revealing for a disruption of the tendon insertion of the longus. ?-Counseled on home exercise therapy and supportive care. ?-Referral to orthopedic surgeon. ? ?Follow Up Instructions: ? ?  ?I discussed the assessment and treatment plan with the patient. The patient was provided an opportunity to ask questions and all were answered. The patient agreed with the plan and demonstrated an understanding of the instructions. ?  ?The patient was advised to call back or seek an in-person evaluation if the symptoms worsen or if the condition fails to improve as anticipated. ? ? ? ?Clearance Coots, MD ? ? ?

## 2021-11-06 ENCOUNTER — Ambulatory Visit (INDEPENDENT_AMBULATORY_CARE_PROVIDER_SITE_OTHER): Payer: Medicare HMO | Admitting: Orthopaedic Surgery

## 2021-11-06 DIAGNOSIS — S3981XA Other specified injuries of abdomen, initial encounter: Secondary | ICD-10-CM | POA: Diagnosis not present

## 2021-11-06 NOTE — Progress Notes (Signed)
? ?                            ? ? ?Chief Complaint: Right abductor longus avulsion ?  ? ? ?History of Present Illness:  ? ? ?Jerome Shaffer is a 66 y.o. male presents with right abductor longus avulsion after playing pickle ball on April 23.  He felt like the groin area had pulled and then subsequently gave out.  Ultrasound and subsequent MRI was performed by Dr. Raeford Razor which found an avulsion of the abductor longus.  He has previously undergone bilateral total hip arthroplasty by Dr. Lyla Glassing in 2017.  He is not having any issues with this prior to this pickleball injury.  He is currently retired.  He is only occasionally experiencing a twinge about the medial aspect of the thigh.  He is hopeful to remain active and pickleball ? ? ? ?Surgical History:   ?Bilateral total hip arthroplasties done in 2017 by Dr. Lyla Glassing ? ?PMH/PSH/Family History/Social History/Meds/Allergies:   ? ?Past Medical History:  ?Diagnosis Date  ? Arthritis   ? Hypertension   ? Stroke [facility])   ? TIA (transient ischemic attack)   ? ?Past Surgical History:  ?Procedure Laterality Date  ? APPENDECTOMY    ? COLONOSCOPY    ? ESOPHAGOGASTRODUODENOSCOPY    ? EYE SURGERY    ? lasic, retina tear repair  ? TOTAL HIP ARTHROPLASTY Left 04/27/2016  ? Procedure: LEFT TOTAL HIP ARTHROPLASTY ANTERIOR APPROACH;  Surgeon: Rod Can, MD;  Location: [place];  Service: Orthopedics;  Laterality: Left;  Needs RNFA  ? TOTAL HIP ARTHROPLASTY Right 06/15/2016  ? Procedure: RIGHT TOTAL HIP ARTHROPLASTY ANTERIOR APPROACH;  Surgeon: Rod Can, MD;  Location: [place];  Service: Orthopedics;  Laterality: Right;  ? vasctectomy    ? ?Social History  ? ?Socioeconomic History  ? Marital status: Married  ?  Spouse name: Not on file  ? Number of children: Not on file  ? Years of education: Not on file  ? Highest education level: Not on file  ?Occupational History  ? Not on file  ?Tobacco Use  ? Smoking status: Never  ? Smokeless tobacco: Never  ?Substance and Sexual  Activity  ? Alcohol use: Yes  ?  Comment: occasional  ? Drug use: No  ? Sexual activity: Yes  ?Other Topics Concern  ? Not on file  ?Social History Narrative  ? Not on file  ? ?Social Determinants of Health  ? ?Financial Resource Strain: Not on file  ?Food Insecurity: Not on file  ?Transportation Needs: Not on file  ?Physical Activity: Not on file  ?Stress: Not on file  ?Social Connections: Not on file  ? ?No family history on file. ?Allergies  ?Allergen Reactions  ? No Known Allergies   ? ?Current Outpatient Medications  ?Medication Sig Dispense Refill  ? amLODipine (NORVASC) 5 MG tablet Take 5 mg by mouth daily.  1  ? Ascorbic Acid (VITAMIN C) 1000 MG tablet Take 1,000 mg by mouth daily.    ? aspirin 81 MG chewable tablet Chew 1 tablet (81 mg total) by mouth 2 (two) times daily. 60 tablet 1  ? atorvastatin (LIPITOR) 40 MG tablet Take 40 mg by mouth daily.  1  ? Coenzyme Q10 (COQ10) 200 MG CAPS Take 200 mg by mouth daily.    ? Flaxseed, Linseed, (FLAXSEED OIL) 1000 MG CAPS Take 1,000 mg by mouth daily.    ? HYDROcodone-acetaminophen (NORCO/VICODIN)  5-325 MG tablet Take 1-2 tablets by mouth every 4 (four) hours as needed (breakthrough pain). 50 tablet 0  ? Ibuprofen-Diphenhydramine HCl (ADVIL PM) 200-25 MG CAPS Take 1-2 tablets by mouth at bedtime as needed (for pain/sleep.).    ? Multiple Vitamin (MULTIVITAMIN WITH MINERALS) TABS tablet Take 1 tablet by mouth daily. Multivitamin for 50+    ? naproxen sodium (ANAPROX) 220 MG tablet Take 440 mg by mouth 2 (two) times daily as needed (for pain.).    ? Omega-3 Fatty Acids (FISH OIL) 1200 MG CAPS Take 1,200 mg by mouth daily.    ? senna (SENOKOT) 8.6 MG TABS tablet Take 2 tablets (17.2 mg total) by mouth at bedtime. 120 each 0  ? VITAMIN E PO Take 1 capsule by mouth daily.    ? ?No current facility-administered medications for this visit.  ? ?No results found. ? ?Review of Systems:   ?A ROS was performed including pertinent positives and negatives as documented in the  HPI. ? ?Physical Exam :   ?Constitutional: NAD and appears stated age ?Neurological: Alert and oriented ?Psych: Appropriate affect and cooperative ?There were no vitals taken for this visit.  ? ?Comprehensive Musculoskeletal Exam:   ? ?Tenderness to palpation about the right abductor tubercle.  He is otherwise painless range of motion about bilateral hips with 30 of internal and external rotation.  No pain with resisted flexion.  No pain about the psoas tendon ? ?Imaging:   ?Xray (AP pelvis, right hip and left hip 2 views): ?Normal ? ?MRI (pelvis): ?There is an avulsion of the abductor longus tendon on the right ? ?I personally reviewed and interpreted the radiographs. ? ? ?Assessment:   ?66 y.o. male with a right abductor longus tendon avulsion.  Overall I described that this is different than the traditional sports hernia which is a result of chronic exertional overuse.  Given the fact that this is avulsed I do believe that his symptoms will likely improve over the course of the next several weeks as his muscle spasms decrease.  I have described that given the fact that a release is often indicated for treatment of sports hernia, there is not necessarily any indication to repair this tendon.  I do believe that he is likely to return to full painless range of motion and strength over the course of the next 4 weeks.  At this time I would like him to return to pickleball in 2 weeks.  I will plan to see him back in 4 weeks for reassessment ? ?Plan :   ? ?-Return to clinic in 4 weeks for reassessment ? ? ? ? ?I personally saw and evaluated the patient, and participated in the management and treatment plan. ? ?Vanetta Mulders, MD ?Attending Physician, Orthopedic Surgery ? ?This document was dictated using Systems analyst. A reasonable attempt at proof reading has been made to minimize errors. ?

## 2021-11-10 ENCOUNTER — Encounter (HOSPITAL_BASED_OUTPATIENT_CLINIC_OR_DEPARTMENT_OTHER): Payer: Self-pay | Admitting: Orthopaedic Surgery

## 2021-11-13 ENCOUNTER — Other Ambulatory Visit: Payer: Self-pay | Admitting: Family Medicine

## 2021-11-13 DIAGNOSIS — N401 Enlarged prostate with lower urinary tract symptoms: Secondary | ICD-10-CM | POA: Diagnosis not present

## 2021-11-13 DIAGNOSIS — R21 Rash and other nonspecific skin eruption: Secondary | ICD-10-CM | POA: Diagnosis not present

## 2021-11-13 DIAGNOSIS — R011 Cardiac murmur, unspecified: Secondary | ICD-10-CM | POA: Diagnosis not present

## 2021-11-13 DIAGNOSIS — Z8249 Family history of ischemic heart disease and other diseases of the circulatory system: Secondary | ICD-10-CM | POA: Diagnosis not present

## 2021-11-13 DIAGNOSIS — Z0000 Encounter for general adult medical examination without abnormal findings: Secondary | ICD-10-CM | POA: Diagnosis not present

## 2021-11-13 DIAGNOSIS — Z125 Encounter for screening for malignant neoplasm of prostate: Secondary | ICD-10-CM | POA: Diagnosis not present

## 2021-11-13 DIAGNOSIS — N529 Male erectile dysfunction, unspecified: Secondary | ICD-10-CM | POA: Diagnosis not present

## 2021-11-13 DIAGNOSIS — I10 Essential (primary) hypertension: Secondary | ICD-10-CM | POA: Diagnosis not present

## 2021-11-13 DIAGNOSIS — R7303 Prediabetes: Secondary | ICD-10-CM | POA: Diagnosis not present

## 2021-11-13 DIAGNOSIS — E7800 Pure hypercholesterolemia, unspecified: Secondary | ICD-10-CM | POA: Diagnosis not present

## 2021-12-02 DIAGNOSIS — L309 Dermatitis, unspecified: Secondary | ICD-10-CM | POA: Diagnosis not present

## 2021-12-02 DIAGNOSIS — R69 Illness, unspecified: Secondary | ICD-10-CM | POA: Diagnosis not present

## 2021-12-04 ENCOUNTER — Ambulatory Visit (HOSPITAL_BASED_OUTPATIENT_CLINIC_OR_DEPARTMENT_OTHER): Payer: Medicare HMO | Admitting: Orthopaedic Surgery

## 2021-12-04 DIAGNOSIS — S3981XA Other specified injuries of abdomen, initial encounter: Secondary | ICD-10-CM | POA: Diagnosis not present

## 2021-12-04 DIAGNOSIS — L814 Other melanin hyperpigmentation: Secondary | ICD-10-CM | POA: Diagnosis not present

## 2021-12-04 DIAGNOSIS — D229 Melanocytic nevi, unspecified: Secondary | ICD-10-CM | POA: Diagnosis not present

## 2021-12-04 DIAGNOSIS — L570 Actinic keratosis: Secondary | ICD-10-CM | POA: Diagnosis not present

## 2021-12-04 DIAGNOSIS — L578 Other skin changes due to chronic exposure to nonionizing radiation: Secondary | ICD-10-CM | POA: Diagnosis not present

## 2021-12-04 DIAGNOSIS — D1801 Hemangioma of skin and subcutaneous tissue: Secondary | ICD-10-CM | POA: Diagnosis not present

## 2021-12-04 DIAGNOSIS — L821 Other seborrheic keratosis: Secondary | ICD-10-CM | POA: Diagnosis not present

## 2021-12-04 DIAGNOSIS — L309 Dermatitis, unspecified: Secondary | ICD-10-CM | POA: Diagnosis not present

## 2021-12-04 NOTE — Progress Notes (Signed)
Chief Complaint: Right abductor longus avulsion     History of Present Illness:   12/04/2021: Presents for follow-up of his right abductor longus avulsion.  Overall he is doing extremely well.  He has no pain at today's visit.  He believes he has made a full recovery.  He is able to return to pickleball without any pain.  Jerome Shaffer is a 66 y.o. male presents with right abductor longus avulsion after playing pickle ball on April 23.  He felt like the groin area had pulled and then subsequently gave out.  Ultrasound and subsequent MRI was performed by Dr. Raeford Razor which found an avulsion of the abductor longus.  He has previously undergone bilateral total hip arthroplasty by Dr. Lyla Glassing in 2017.  He is not having any issues with this prior to this pickleball injury.  He is currently retired.  He is only occasionally experiencing a twinge about the medial aspect of the thigh.  He is hopeful to remain active and pickleball    Surgical History:   Bilateral total hip arthroplasties done in 2017 by Dr. Lyla Glassing  PMH/PSH/Family History/Social History/Meds/Allergies:    Past Medical History:  Diagnosis Date   Arthritis    Hypertension    Stroke [facility])    TIA (transient ischemic attack)    Past Surgical History:  Procedure Laterality Date   APPENDECTOMY     COLONOSCOPY     ESOPHAGOGASTRODUODENOSCOPY     EYE SURGERY     lasic, retina tear repair   TOTAL HIP ARTHROPLASTY Left 04/27/2016   Procedure: LEFT TOTAL HIP ARTHROPLASTY ANTERIOR APPROACH;  Surgeon: Rod Can, MD;  Location: [place];  Service: Orthopedics;  Laterality: Left;  Needs RNFA   TOTAL HIP ARTHROPLASTY Right 06/15/2016   Procedure: RIGHT TOTAL HIP ARTHROPLASTY ANTERIOR APPROACH;  Surgeon: Rod Can, MD;  Location: [place];  Service: Orthopedics;  Laterality: Right;   vasctectomy     Social History   Socioeconomic History   Marital status: Married    Spouse name: Not on file    Number of children: Not on file   Years of education: Not on file   Highest education level: Not on file  Occupational History   Not on file  Tobacco Use   Smoking status: Never   Smokeless tobacco: Never  Substance and Sexual Activity   Alcohol use: Yes    Comment: occasional   Drug use: No   Sexual activity: Yes  Other Topics Concern   Not on file  Social History Narrative   Not on file   Social Determinants of Health   Financial Resource Strain: Not on file  Food Insecurity: Not on file  Transportation Needs: Not on file  Physical Activity: Not on file  Stress: Not on file  Social Connections: Not on file   No family history on file. Allergies  Allergen Reactions   No Known Allergies    Current Outpatient Medications  Medication Sig Dispense Refill   amLODipine (NORVASC) 5 MG tablet Take 5 mg by mouth daily.  1   Ascorbic Acid (VITAMIN C) 1000 MG tablet Take 1,000 mg by mouth daily.     aspirin 81 MG chewable tablet Chew 1 tablet (81 mg total) by mouth 2 (two) times daily. 60 tablet 1   atorvastatin (LIPITOR) 40 MG tablet  Take 40 mg by mouth daily.  1   Coenzyme Q10 (COQ10) 200 MG CAPS Take 200 mg by mouth daily.     Flaxseed, Linseed, (FLAXSEED OIL) 1000 MG CAPS Take 1,000 mg by mouth daily.     HYDROcodone-acetaminophen (NORCO/VICODIN) 5-325 MG tablet Take 1-2 tablets by mouth every 4 (four) hours as needed (breakthrough pain). 50 tablet 0   Ibuprofen-Diphenhydramine HCl (ADVIL PM) 200-25 MG CAPS Take 1-2 tablets by mouth at bedtime as needed (for pain/sleep.).     Multiple Vitamin (MULTIVITAMIN WITH MINERALS) TABS tablet Take 1 tablet by mouth daily. Multivitamin for 50+     naproxen sodium (ANAPROX) 220 MG tablet Take 440 mg by mouth 2 (two) times daily as needed (for pain.).     Omega-3 Fatty Acids (FISH OIL) 1200 MG CAPS Take 1,200 mg by mouth daily.     senna (SENOKOT) 8.6 MG TABS tablet Take 2 tablets (17.2 mg total) by mouth at bedtime. 120 each 0    VITAMIN E PO Take 1 capsule by mouth daily.     No current facility-administered medications for this visit.   No results found.  Review of Systems:   A ROS was performed including pertinent positives and negatives as documented in the HPI.  Physical Exam :   Constitutional: NAD and appears stated age Neurological: Alert and oriented Psych: Appropriate affect and cooperative There were no vitals taken for this visit.   Comprehensive Musculoskeletal Exam:    No tenderness to palpation about the right abductor tubercle.  He is otherwise painless range of motion about bilateral hips with 30 of internal and external rotation.  No pain with resisted flexion.  No pain about the psoas tendon  Imaging:   Xray (AP pelvis, right hip and left hip 2 views): Normal  MRI (pelvis): There is an avulsion of the abductor longus tendon on the right  I personally reviewed and interpreted the radiographs.   Assessment:   66 y.o. male with a right abductor longus tendon avulsion.  Overall he has no pain at today's visit and he is doing much better.  He may return to activity as tolerated.  I will see him back on an as-needed basis  Plan :    -Return to clinic as needed     I personally saw and evaluated the patient, and participated in the management and treatment plan.  Vanetta Mulders, MD Attending Physician, Orthopedic Surgery  This document was dictated using Dragon voice recognition software. A reasonable attempt at proof reading has been made to minimize errors.

## 2021-12-23 ENCOUNTER — Ambulatory Visit
Admission: RE | Admit: 2021-12-23 | Discharge: 2021-12-23 | Disposition: A | Discharge status: Home / Self Care | Payer: Medicare HMO | Source: Ambulatory Visit | Attending: Family Medicine | Admitting: Family Medicine

## 2021-12-23 DIAGNOSIS — Z8249 Family history of ischemic heart disease and other diseases of the circulatory system: Secondary | ICD-10-CM

## 2021-12-23 DIAGNOSIS — Z136 Encounter for screening for cardiovascular disorders: Secondary | ICD-10-CM | POA: Diagnosis not present

## 2021-12-24 DIAGNOSIS — R011 Cardiac murmur, unspecified: Secondary | ICD-10-CM | POA: Diagnosis not present

## 2022-01-06 ENCOUNTER — Other Ambulatory Visit: Payer: Self-pay

## 2022-01-06 ENCOUNTER — Emergency Department (HOSPITAL_BASED_OUTPATIENT_CLINIC_OR_DEPARTMENT_OTHER)
Admission: EM | Admit: 2022-01-06 | Discharge: 2022-01-06 | Disposition: A | Discharge status: Home / Self Care | Payer: Medicare HMO | Attending: Emergency Medicine | Admitting: Emergency Medicine

## 2022-01-06 ENCOUNTER — Encounter (HOSPITAL_BASED_OUTPATIENT_CLINIC_OR_DEPARTMENT_OTHER): Payer: Self-pay | Admitting: Obstetrics and Gynecology

## 2022-01-06 ENCOUNTER — Emergency Department (HOSPITAL_BASED_OUTPATIENT_CLINIC_OR_DEPARTMENT_OTHER): Payer: Medicare HMO | Admitting: Radiology

## 2022-01-06 DIAGNOSIS — Y9355 Activity, bike riding: Secondary | ICD-10-CM | POA: Insufficient documentation

## 2022-01-06 DIAGNOSIS — R1030 Lower abdominal pain, unspecified: Secondary | ICD-10-CM | POA: Diagnosis not present

## 2022-01-06 DIAGNOSIS — R1032 Left lower quadrant pain: Secondary | ICD-10-CM | POA: Insufficient documentation

## 2022-01-06 DIAGNOSIS — Z7982 Long term (current) use of aspirin: Secondary | ICD-10-CM | POA: Diagnosis not present

## 2022-01-06 DIAGNOSIS — S0993XA Unspecified injury of face, initial encounter: Secondary | ICD-10-CM | POA: Diagnosis not present

## 2022-01-06 DIAGNOSIS — Z79899 Other long term (current) drug therapy: Secondary | ICD-10-CM | POA: Diagnosis not present

## 2022-01-06 DIAGNOSIS — Y92410 Unspecified street and highway as the place of occurrence of the external cause: Secondary | ICD-10-CM | POA: Diagnosis not present

## 2022-01-06 DIAGNOSIS — R102 Pelvic and perineal pain: Secondary | ICD-10-CM | POA: Diagnosis not present

## 2022-01-06 DIAGNOSIS — S0031XA Abrasion of nose, initial encounter: Secondary | ICD-10-CM | POA: Diagnosis not present

## 2022-01-06 NOTE — Discharge Instructions (Addendum)
Please keep wound clean and dry No evidence of broken bones Use acetaminophen and ibuprofen as needed for pain Use heat and cold as needed for pain Return if you are having any worsening or new symptoms
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
## 2022-02-09 ENCOUNTER — Ambulatory Visit: Payer: Medicare HMO | Admitting: Family Medicine

## 2022-02-09 ENCOUNTER — Encounter: Payer: Self-pay | Admitting: Family Medicine

## 2022-02-09 VITALS — BP 158/81 | Ht 65.0 in | Wt 135.0 lb

## 2022-02-09 DIAGNOSIS — S46211A Strain of muscle, fascia and tendon of other parts of biceps, right arm, initial encounter: Secondary | ICD-10-CM

## 2022-02-09 DIAGNOSIS — M7022 Olecranon bursitis, left elbow: Secondary | ICD-10-CM
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
## 2022-02-09 NOTE — Patient Instructions (Signed)
Good to see you Please use ice as needed  Please try the exercises  Please try compression on the elbow  Please follow up if you want to try shockwave for the shoulder    Please send me a message in MyChart with any questions or updates.  Please see me back in 4 weeks or as needed if better.   --Dr. Raeford Razor
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
## 2022-02-10 ENCOUNTER — Ambulatory Visit: Payer: Medicare HMO | Admitting: Family Medicine

## 2022-02-13 ENCOUNTER — Emergency Department (HOSPITAL_BASED_OUTPATIENT_CLINIC_OR_DEPARTMENT_OTHER): Payer: Medicare HMO | Admitting: Radiology

## 2022-02-13 ENCOUNTER — Emergency Department (HOSPITAL_BASED_OUTPATIENT_CLINIC_OR_DEPARTMENT_OTHER)
Admission: EM | Admit: 2022-02-13 | Discharge: 2022-02-13 | Disposition: A | Discharge status: Home / Self Care | Payer: Medicare HMO | Attending: Emergency Medicine | Admitting: Emergency Medicine

## 2022-02-13 ENCOUNTER — Other Ambulatory Visit (HOSPITAL_BASED_OUTPATIENT_CLINIC_OR_DEPARTMENT_OTHER): Payer: Self-pay

## 2022-02-13 ENCOUNTER — Encounter (HOSPITAL_BASED_OUTPATIENT_CLINIC_OR_DEPARTMENT_OTHER): Payer: Self-pay | Admitting: Emergency Medicine

## 2022-02-13 ENCOUNTER — Ambulatory Visit (INDEPENDENT_AMBULATORY_CARE_PROVIDER_SITE_OTHER): Payer: Medicare HMO | Admitting: Orthopaedic Surgery

## 2022-02-13 DIAGNOSIS — S40021A Contusion of right upper arm, initial encounter: Secondary | ICD-10-CM | POA: Insufficient documentation

## 2022-02-13 DIAGNOSIS — Z79899 Other long term (current) drug therapy: Secondary | ICD-10-CM | POA: Diagnosis not present

## 2022-02-13 DIAGNOSIS — Y9373 Activity, racquet and hand sports: Secondary | ICD-10-CM | POA: Insufficient documentation

## 2022-02-13 DIAGNOSIS — S42201A Unspecified fracture of upper end of right humerus, initial encounter for closed fracture: Secondary | ICD-10-CM | POA: Diagnosis not present

## 2022-02-13 DIAGNOSIS — M25521 Pain in right elbow: Secondary | ICD-10-CM | POA: Diagnosis not present

## 2022-02-13 DIAGNOSIS — S46211A Strain of muscle, fascia and tendon of other parts of biceps, right arm, initial encounter: Secondary | ICD-10-CM | POA: Diagnosis not present

## 2022-02-13 DIAGNOSIS — Z7982 Long term (current) use of aspirin: Secondary | ICD-10-CM | POA: Diagnosis not present

## 2022-02-13 DIAGNOSIS — M79621 Pain in right upper arm: Secondary | ICD-10-CM | POA: Diagnosis not present

## 2022-02-13 DIAGNOSIS — M7989 Other specified soft tissue disorders: Secondary | ICD-10-CM | POA: Diagnosis not present

## 2022-02-13 DIAGNOSIS — I10 Essential (primary) hypertension: Secondary | ICD-10-CM | POA: Diagnosis not present

## 2022-02-13 DIAGNOSIS — S59901A Unspecified injury of right elbow, initial encounter: Secondary | ICD-10-CM | POA: Diagnosis not present

## 2022-02-13 DIAGNOSIS — X58XXXA Exposure to other specified factors, initial encounter: Secondary | ICD-10-CM | POA: Insufficient documentation

## 2022-02-13 DIAGNOSIS — S4991XA Unspecified injury of right shoulder and upper arm, initial encounter: Secondary | ICD-10-CM | POA: Diagnosis not present

## 2022-02-13 DIAGNOSIS — S46901A Unspecified injury of unspecified muscle, fascia and tendon at shoulder and upper arm level, right arm, initial encounter: Secondary | ICD-10-CM

## 2022-02-13 DIAGNOSIS — M79601 Pain in right arm: Secondary | ICD-10-CM | POA: Diagnosis not present

## 2022-02-13 MED ORDER — METHOCARBAMOL 500 MG PO TABS
500.0000 mg | ORAL_TABLET | Freq: Two times a day (BID) | ORAL | 2 refills | Status: AC
  Filled 2022-02-13: qty 30, 15d supply, fill #0

## 2022-02-13 NOTE — Discharge Instructions (Addendum)
Your x-rays were negative for any signs of fracture or dislocation today.  Please continue with your orthopedic follow-up within the next few days for reevaluation continue medical management.  I have provided Dr. Eddie Dibbles office contact information, as I believe he may be the local orthopedic specialist in this facility you were describing.    Continue taking your Mobic/meloxicam as directed.  You will may also continue resting and icing the area.  You may utilize a sling for support when active, however use for no more than 3 to 4 hours at a time to reduce stiffness of elbow/shoulder.    Return to the ED for new or worsening symptoms as discussed.

## 2022-02-13 NOTE — ED Notes (Signed)
Discharge instructions and follow up care reviewed and explained, pt verbalized understanding. Sling placed on R. Arm/shoulder. Ice pack provided. Pt caox4 and ambulatory on d/c.

## 2022-02-13 NOTE — ED Triage Notes (Signed)
Pt here with c/o left bicep pain along bruising ,

## 2022-02-13 NOTE — Progress Notes (Signed)
Chief Complaint: Right biceps pain     History of Present Illness:    Jerome Shaffer is a 66 y.o. male presents today with a right biceps deformity with pain and cramping.  He states that he was playing pickle ball 10 days ago and felt a pop in the shoulder with subsequent bruising and cramping of the lower biceps.  He was seen by Dr. Clearance Coots who diagnosed him with a proximal long head of the biceps tendon rupture.  He is here today for further assessment.    Surgical History:   None  PMH/PSH/Family History/Social History/Meds/Allergies:    Past Medical History:  Diagnosis Date   Arthritis    Hypertension    Stroke ([place])    TIA (transient ischemic attack)    Past Surgical History:  Procedure Laterality Date   APPENDECTOMY     COLONOSCOPY     ESOPHAGOGASTRODUODENOSCOPY     EYE SURGERY     lasic, retina tear repair   TOTAL HIP ARTHROPLASTY Left 04/27/2016   Procedure: LEFT TOTAL HIP ARTHROPLASTY ANTERIOR APPROACH;  Surgeon: Rod Can, MD;  Location: [place];  Service: Orthopedics;  Laterality: Left;  Needs RNFA   TOTAL HIP ARTHROPLASTY Right 06/15/2016   Procedure: RIGHT TOTAL HIP ARTHROPLASTY ANTERIOR APPROACH;  Surgeon: Rod Can, MD;  Location: [place];  Service: Orthopedics;  Laterality: Right;   vasctectomy     Social History   Socioeconomic History   Marital status: Married    Spouse name: Not on file   Number of children: Not on file   Years of education: Not on file   Highest education level: Not on file  Occupational History   Not on file  Tobacco Use   Smoking status: Never   Smokeless tobacco: Never  Vaping Use   Vaping Use: Never used  Substance and Sexual Activity   Alcohol use: Yes    Alcohol/week: 3.0 standard drinks of alcohol    Types: 3 Glasses of wine per week   Drug use: No   Sexual activity: Yes  Other Topics Concern   Not on file  Social History Narrative   Not on file   Social  Determinants of Health   Financial Resource Strain: Not on file  Food Insecurity: Not on file  Transportation Needs: Not on file  Physical Activity: Not on file  Stress: Not on file  Social Connections: Not on file   No family history on file. Allergies  Allergen Reactions   No Known Allergies    Current Outpatient Medications  Medication Sig Dispense Refill   methocarbamol (ROBAXIN) 500 MG tablet Take 1 tablet (500 mg total) by mouth 2 (two) times daily. 30 tablet 2   amLODipine (NORVASC) 5 MG tablet Take 5 mg by mouth daily.  1   Ascorbic Acid (VITAMIN C) 1000 MG tablet Take 1,000 mg by mouth daily.     aspirin 81 MG chewable tablet Chew 1 tablet (81 mg total) by mouth 2 (two) times daily. 60 tablet 1   Flaxseed, Linseed, (FLAXSEED OIL) 1000 MG CAPS Take 1,000 mg by mouth daily.     Ibuprofen-Diphenhydramine HCl (ADVIL PM) 200-25 MG CAPS Take 1-2 tablets by mouth at bedtime as needed (for pain/sleep.).     Multiple Vitamin (MULTIVITAMIN WITH MINERALS) TABS tablet Take 1 tablet  by mouth daily. Multivitamin for 50+     Omega-3 Fatty Acids (FISH OIL) 1200 MG CAPS Take 1,200 mg by mouth daily.     VITAMIN E PO Take 1 capsule by mouth daily.     No current facility-administered medications for this visit.   DG Elbow Complete Right  Result Date: 02/13/2022 CLINICAL DATA:  Pain after injury 1 week ago EXAM: RIGHT ELBOW - COMPLETE 3+ VIEW COMPARISON:  None Available. FINDINGS: Soft tissue swelling over the olecranon. Mild enthesopathic changes off the olecranon. No fracture or joint effusion identified. IMPRESSION: Soft tissue swelling over the olecranon may be seen with bursitis. Enthesopathic changes off the olecranon. No fractures or effusions. Electronically Signed   By: Dorise Bullion III M.D.   On: 02/13/2022 11:14   DG Humerus Right  Result Date: 02/13/2022 CLINICAL DATA:  Injury 1 week ago while playing pickle ball.  Pain. EXAM: RIGHT HUMERUS - 2+ VIEW COMPARISON:  None  Available. FINDINGS: AC joint degenerative changes. No fracture or dislocation in the humerus or shoulder. Limited views of the right chest are normal. IMPRESSION: No acute abnormalities.  AC joint degenerative changes. Electronically Signed   By: Dorise Bullion III M.D.   On: 02/13/2022 11:10    Review of Systems:   A ROS was performed including pertinent positives and negatives as documented in the HPI.  Physical Exam :   Constitutional: NAD and appears stated age Neurological: Alert and oriented Psych: Appropriate affect and cooperative There were no vitals taken for this visit.   Comprehensive Musculoskeletal Exam:    Positive Popeye sign.  There is cramping and bruising at the distal biceps area.  He has full painless range of motion about the shoulder equal to the contralateral side.  Imaging:    I personally reviewed and interpreted the radiographs.   Assessment:   66 y.o. male with a right biceps rupture spontaneously with subsequent Popeye deformity.  I advised that typically these eventually do become pain-free.  The majority of the pain comes from early cramping which she is experiencing.  I have prescribed him Robaxin for this.  I will plan to see him back in 1 month for recheck  Plan :    -Return to clinic 1 month     I personally saw and evaluated the patient, and participated in the management and treatment plan.  Vanetta Mulders, MD Attending Physician, Orthopedic Surgery  This document was dictated using Dragon voice recognition software. A reasonable attempt at proof reading has been made to minimize errors.

## 2022-02-13 NOTE — ED Provider Notes (Signed)
[facility] EMERGENCY DEPT Provider Note   CSN: [id number] Arrival date & time: 02/13/22  6269     History  Chief Complaint  Patient presents with   Arm Injury    Jerome Shaffer is a 66 y.o. male presenting today with complaints of acute right arm pain.  Was playing pickle ball about 10 days ago when Jerome Shaffer felt a sharp pain in his right arm.  Noticed some increased weakness, still able to move and flex the arm.  Saw sports medicine, was told it may be a rotator cuff injury.  However has noticed that his bicep appears larger and there is now bruising near his elbow.  Jerome Shaffer is concerned there may be further injury and would like to be evaluated.  Denies numbness or tingling of the extremity, except when icing it.  Denies any other injury at this time.  The history is provided by the patient and medical records.       Home Medications Prior to Admission medications   Medication Sig Start Date End Date Taking? Authorizing Provider  amLODipine (NORVASC) 5 MG tablet Take 5 mg by mouth daily. 01/30/16   [provider]  Ascorbic Acid (VITAMIN C) 1000 MG tablet Take 1,000 mg by mouth daily.    [provider]  aspirin 81 MG chewable tablet Chew 1 tablet (81 mg total) by mouth 2 (two) times daily. 04/28/16   Swinteck, Aaron Edelman, MD  Flaxseed, Linseed, (FLAXSEED OIL) 1000 MG CAPS Take 1,000 mg by mouth daily.    [provider]  Ibuprofen-Diphenhydramine HCl (ADVIL PM) 200-25 MG CAPS Take 1-2 tablets by mouth at bedtime as needed (for pain/sleep.).    [provider]  Multiple Vitamin (MULTIVITAMIN WITH MINERALS) TABS tablet Take 1 tablet by mouth daily. Multivitamin for 50+    [provider]  Omega-3 Fatty Acids (FISH OIL) 1200 MG CAPS Take 1,200 mg by mouth daily.    [provider]  VITAMIN E PO Take 1 capsule by mouth daily.    [provider]      Allergies    No known allergies    Review of Systems   Review of  Systems  Musculoskeletal:        Right upper arm injury    Physical Exam Updated Vital Signs BP (!) 147/90   Pulse 70   Temp (!) 97.3 F (36.3 C) (Oral)   Resp 18   SpO2 100%  Physical Exam Vitals and nursing note reviewed.  Constitutional:      General: Jerome Shaffer is not in acute distress.    Appearance: Jerome Shaffer is well-developed.  HENT:     Head: Normocephalic and atraumatic.  Eyes:     Conjunctiva/sclera: Conjunctivae normal.  Cardiovascular:     Rate and Rhythm: Normal rate and regular rhythm.     Pulses: Normal pulses.     Heart sounds: No murmur heard. Pulmonary:     Effort: Pulmonary effort is normal. No respiratory distress.     Breath sounds: Normal breath sounds.  Abdominal:     Palpations: Abdomen is soft.     Tenderness: There is no abdominal tenderness.  Musculoskeletal:        General: No swelling.       Arms:     Cervical back: Neck supple.     Comments: Tenderness and enlargement of the right biceps area.  Mild decreased circumference of right proximal upper arm in comparison of left upper arm.  Still able to flex  and extend, though subjective mild weakness.  Increased tenderness elicited with AROM, specifically pronation/supination, flexion, and rotation.  Full PROM of shoulder and elbow.  Full PROM and AROM of wrist and digits.  Radial pulses 2+ bilaterally.  Sensation appears grossly intact.  Mild ecchymosis appreciated just superior to the anterior olecranon.  No bony tenderness of right upper extremity.  No erythema, joint swelling, or warmth appreciated.  Skin:    General: Skin is warm and dry.     Capillary Refill: Capillary refill takes less than 2 seconds.  Neurological:     Mental Status: Jerome Shaffer is alert and oriented to person, place, and time.  Psychiatric:        Mood and Affect: Mood normal.     ED Results / Procedures / Treatments   Labs (all labs ordered are listed, but only abnormal results are displayed) Labs Reviewed - No data to  display  EKG None  Radiology DG Elbow Complete Right  Result Date: 02/13/2022 CLINICAL DATA:  Pain after injury 1 week ago EXAM: RIGHT ELBOW - COMPLETE 3+ VIEW COMPARISON:  None Available. FINDINGS: Soft tissue swelling over the olecranon. Mild enthesopathic changes off the olecranon. No fracture or joint effusion identified. IMPRESSION: Soft tissue swelling over the olecranon may be seen with bursitis. Enthesopathic changes off the olecranon. No fractures or effusions. Electronically Signed   By: Dorise Bullion III M.D.   On: 02/13/2022 11:14   DG Humerus Right  Result Date: 02/13/2022 CLINICAL DATA:  Injury 1 week ago while playing pickle ball.  Pain. EXAM: RIGHT HUMERUS - 2+ VIEW COMPARISON:  None Available. FINDINGS: AC joint degenerative changes. No fracture or dislocation in the humerus or shoulder. Limited views of the right chest are normal. IMPRESSION: No acute abnormalities.  AC joint degenerative changes. Electronically Signed   By: Dorise Bullion III M.D.   On: 02/13/2022 11:10    Procedures Procedures    Medications Ordered in ED Medications - No data to display  ED Course/ Medical Decision Making/ A&P                           Medical Decision Making Amount and/or Complexity of Data Reviewed Radiology: ordered.   66 y.o. male presents to the ED for concern of Arm Injury   This involves an extensive number of treatment options, and is a complaint that carries with it a high risk of complications and morbidity.  The emergent differential diagnosis prior to evaluation includes, but is not limited to: Fracture, dislocation, sprain, ligamentous or tendon tear, tendinitis  This is not an exhaustive differential.   Past Medical History / Co-morbidities / Social History: Hx of HTN, prior TIA, prior stroke Social Determinants of Health include: Elderly  Additional History:  None  Lab Tests: None  Imaging Studies: I ordered imaging studies including XR right  shoulder and elbow.   I independently visualized and interpreted imaging which showed negative for acute fracture or dislocation, although notes soft tissue swelling over the olecranon.  No fat pad sign. I agree with the radiologist interpretation.  ED Course: Pt well-appearing on exam.  Presenting with right bicep enlargement and tenderness after sports injury about 10 days ago.  Seen by sports medicine, was informed it may be rotator cuff related.  Still with continued pain, new ecchymosis of the anterior distal upper arm.  I believe the ecchymosis likely benign, related to healing process, and due to gravity.  No bony  tenderness.  Compartments soft.  Still with grossly intact active and passive range of motion of right shoulder and right elbow.  Mild tenderness elicited with supination/pronation of elbows, although x-ray imaging and physical exam not suggestive of acute fracture or dislocation.  Upper extremities appear neurovascularly intact.  Concerned for possible partial tear of biceps or brachioradialis, vs tendinitis.  Recommend follow-up with orthopedics for further evaluation and treatment and for possibility of missed fracture.  Pt given sling for support while in ED.  Instructed to use this for short periods to reduce the risk of developing stiffness in elbow or shoulder.  Already on meloxicam 7.5 daily, recommended continuation of this and RICE therapy as conservative therapy.  Patient was seen by the local orthopedics at [employer] [facility] and would like to follow-up with them, but also requests an orthopedic referral in the possibility that Jerome Shaffer may  unable to be seen in an efficient manner.  Resources provided.  Patient satisfied with today's encounter.  Patient in NAD and in good condition at time of discharge.  Disposition: After consideration the patient's encounter today, I do not feel today's workup suggests an emergent condition requiring admission or immediate intervention beyond what has been  performed at this time.  Safe for discharge; instructed to return immediately for worsening symptoms, change in symptoms or any other concerns.  I have reviewed the patients home medicines and have made adjustments as needed.  Discussed course of treatment with the patient, whom demonstrated understanding.  Patient in agreement and has no further questions.    I discussed this case with my attending physician Dr. Langston Masker, who agreed with the proposed treatment course and cosigned this note including patient's presenting symptoms, physical exam, and planned diagnostics and interventions.  Attending physician stated agreement with plan or made changes to plan which were implemented.     This chart was dictated using voice recognition software.  Despite best efforts to proofread, errors can occur which can change the documentation meaning.         Final Clinical Impression(s) / ED Diagnoses Final diagnoses:  Pain in right upper arm  Injury of muscle of right upper arm    Rx / DC Orders ED Discharge Orders     None         Candace Cruise 44/81/85 1257    Wyvonnia Dusky, MD 02/13/22 1310

## 2022-02-15 ENCOUNTER — Encounter (HOSPITAL_BASED_OUTPATIENT_CLINIC_OR_DEPARTMENT_OTHER): Payer: Self-pay | Admitting: Orthopaedic Surgery

## 2022-03-13 ENCOUNTER — Ambulatory Visit (HOSPITAL_BASED_OUTPATIENT_CLINIC_OR_DEPARTMENT_OTHER): Payer: Medicare HMO | Admitting: Orthopaedic Surgery

## 2022-03-13 ENCOUNTER — Ambulatory Visit (INDEPENDENT_AMBULATORY_CARE_PROVIDER_SITE_OTHER): Payer: Medicare HMO | Admitting: Orthopaedic Surgery

## 2022-03-13 DIAGNOSIS — S46211A Strain of muscle, fascia and tendon of other parts of biceps, right arm, initial encounter: Secondary | ICD-10-CM

## 2022-03-13 NOTE — Progress Notes (Signed)
Chief Complaint: Right biceps pain     History of Present Illness:   03/13/2022: Presents today for follow-up of his right biceps injury.  Overall his pain continues to improve.  Just some soreness when reaching behind back this time.  Overall he is dramatically better.  He is now playing pickle ball without any pain.  Jerome Shaffer is a 66 y.o. male presents today with a right biceps deformity with pain and cramping.  He states that he was playing pickle ball 10 days ago and felt a pop in the shoulder with subsequent bruising and cramping of the lower biceps.  He was seen by Dr. Clearance Coots who diagnosed him with a proximal long head of the biceps tendon rupture.  He is here today for further assessment.    Surgical History:   None  PMH/PSH/Family History/Social History/Meds/Allergies:    Past Medical History:  Diagnosis Date   Arthritis    Hypertension    Stroke ([place])    TIA (transient ischemic attack)    Past Surgical History:  Procedure Laterality Date   APPENDECTOMY     COLONOSCOPY     ESOPHAGOGASTRODUODENOSCOPY     EYE SURGERY     lasic, retina tear repair   TOTAL HIP ARTHROPLASTY Left 04/27/2016   Procedure: LEFT TOTAL HIP ARTHROPLASTY ANTERIOR APPROACH;  Surgeon: Rod Can, MD;  Location: [facility];  Service: Orthopedics;  Laterality: Left;  Needs RNFA   TOTAL HIP ARTHROPLASTY Right 06/15/2016   Procedure: RIGHT TOTAL HIP ARTHROPLASTY ANTERIOR APPROACH;  Surgeon: Rod Can, MD;  Location: [facility];  Service: Orthopedics;  Laterality: Right;   vasctectomy     Social History   Socioeconomic History   Marital status: Married    Spouse name: Not on file   Number of children: Not on file   Years of education: Not on file   Highest education level: Not on file  Occupational History   Not on file  Tobacco Use   Smoking status: Never   Smokeless tobacco: Never  Vaping Use   Vaping Use: Never used  Substance and Sexual  Activity   Alcohol use: Yes    Alcohol/week: 3.0 standard drinks of alcohol    Types: 3 Glasses of wine per week   Drug use: No   Sexual activity: Yes  Other Topics Concern   Not on file  Social History Narrative   Not on file   Social Determinants of Health   Financial Resource Strain: Not on file  Food Insecurity: Not on file  Transportation Needs: Not on file  Physical Activity: Not on file  Stress: Not on file  Social Connections: Not on file   No family history on file. Allergies  Allergen Reactions   No Known Allergies    Current Outpatient Medications  Medication Sig Dispense Refill   amLODipine (NORVASC) 5 MG tablet Take 5 mg by mouth daily.  1   Ascorbic Acid (VITAMIN C) 1000 MG tablet Take 1,000 mg by mouth daily.     aspirin 81 MG chewable tablet Chew 1 tablet (81 mg total) by mouth 2 (two) times daily. 60 tablet 1   Flaxseed, Linseed, (FLAXSEED OIL) 1000 MG CAPS Take 1,000 mg by mouth daily.     Ibuprofen-Diphenhydramine HCl (ADVIL PM) 200-25 MG CAPS Take 1-2 tablets by  mouth at bedtime as needed (for pain/sleep.).     methocarbamol (ROBAXIN) 500 MG tablet Take 1 tablet (500 mg total) by mouth 2 (two) times daily. 30 tablet 2   Multiple Vitamin (MULTIVITAMIN WITH MINERALS) TABS tablet Take 1 tablet by mouth daily. Multivitamin for 50+     Omega-3 Fatty Acids (FISH OIL) 1200 MG CAPS Take 1,200 mg by mouth daily.     VITAMIN E PO Take 1 capsule by mouth daily.     No current facility-administered medications for this visit.   No results found.  Review of Systems:   A ROS was performed including pertinent positives and negatives as documented in the HPI.  Physical Exam :   Constitutional: NAD and appears stated age Neurological: Alert and oriented Psych: Appropriate affect and cooperative There were no vitals taken for this visit.   Comprehensive Musculoskeletal Exam:    Positive Popeye sign.  There is cramping and bruising at the distal biceps area.  He  has full painless range of motion about the shoulder equal to the contralateral side.  Imaging:    I personally reviewed and interpreted the radiographs.   Assessment:   66 y.o. male with a right biceps rupture spontaneously with subsequent Popeye deformity.  At this time he is overall doing much better.  He is returned to pickleball and only having mild pain when going to the back.  At this time he will be exercise and activity as tolerated.  Plan :    -Return to clinic as needed     I personally saw and evaluated the patient, and participated in the management and treatment plan.  Vanetta Mulders, MD Attending Physician, Orthopedic Surgery  This document was dictated using Dragon voice recognition software. A reasonable attempt at proof reading has been made to minimize errors.

## 2022-05-20 DIAGNOSIS — L209 Atopic dermatitis, unspecified: Secondary | ICD-10-CM | POA: Diagnosis not present

## 2022-05-20 DIAGNOSIS — R7303 Prediabetes: Secondary | ICD-10-CM | POA: Diagnosis not present

## 2022-05-20 DIAGNOSIS — E7800 Pure hypercholesterolemia, unspecified: Secondary | ICD-10-CM | POA: Diagnosis not present

## 2022-05-20 DIAGNOSIS — R0981 Nasal congestion: Secondary | ICD-10-CM | POA: Diagnosis not present

## 2022-05-20 DIAGNOSIS — N401 Enlarged prostate with lower urinary tract symptoms: Secondary | ICD-10-CM | POA: Diagnosis not present

## 2022-05-20 DIAGNOSIS — I10 Essential (primary) hypertension: Secondary | ICD-10-CM | POA: Diagnosis not present

## 2022-07-21 DIAGNOSIS — J343 Hypertrophy of nasal turbinates: Secondary | ICD-10-CM | POA: Diagnosis not present

## 2022-07-21 DIAGNOSIS — S0992XS Unspecified injury of nose, sequela: Secondary | ICD-10-CM | POA: Diagnosis not present

## 2022-07-21 DIAGNOSIS — J342 Deviated nasal septum: Secondary | ICD-10-CM | POA: Diagnosis not present

## 2022-08-23 IMAGING — US US ABDOMINAL AORTA SCREENING AAA
1 series · 14 of 25 positions shown · non-contrast
Comparison: None Available.

CLINICAL DATA: Screening for abdominal aortic aneurysm

EXAM:
US ABDOMINAL AORTA MEDICARE SCREENING
TECHNIQUE: Ultrasound examination of the abdominal aorta was performed as a
screening evaluation for abdominal aortic aneurysm.

[Series 1: us abdominal aorta screening aaa · 0.25mm/px · 14 of 27 slices shown]
[im 1/27]
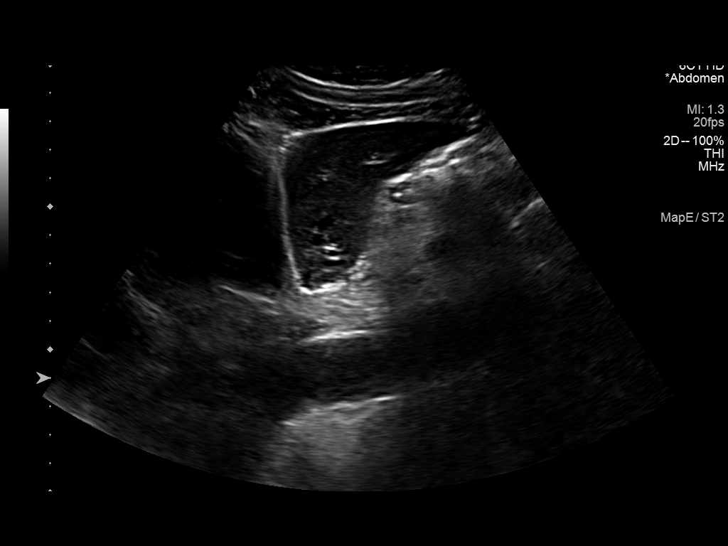
[im 3/27]
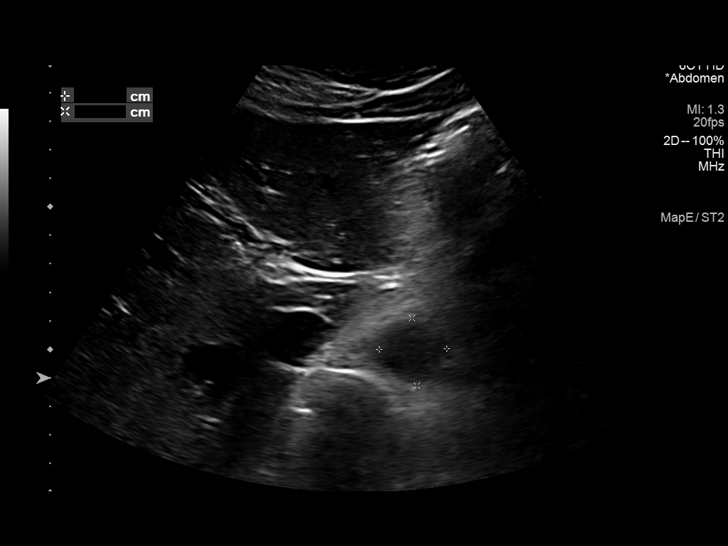
[im 5/27]
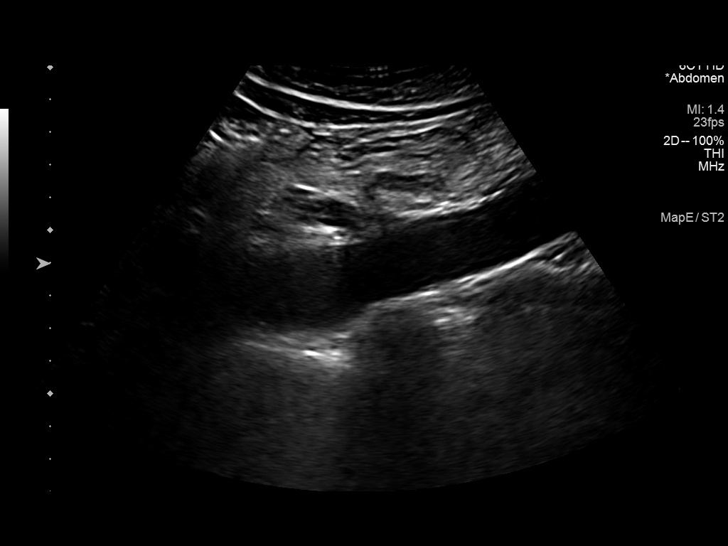
[im 7/27]
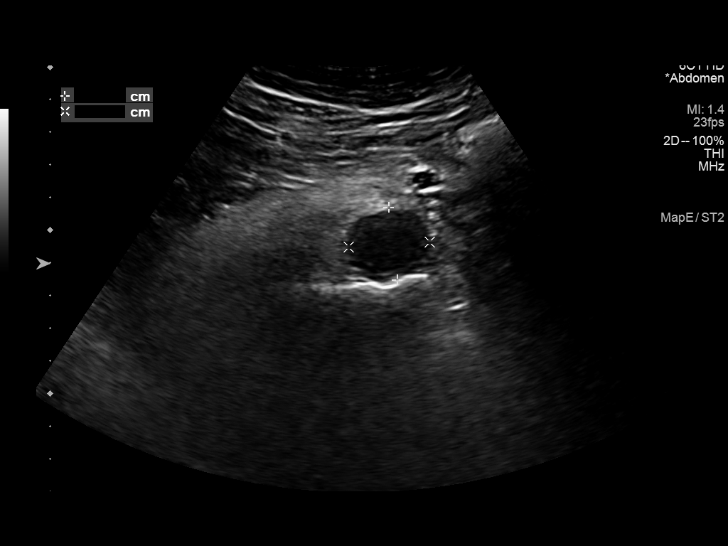
[im 9/27]
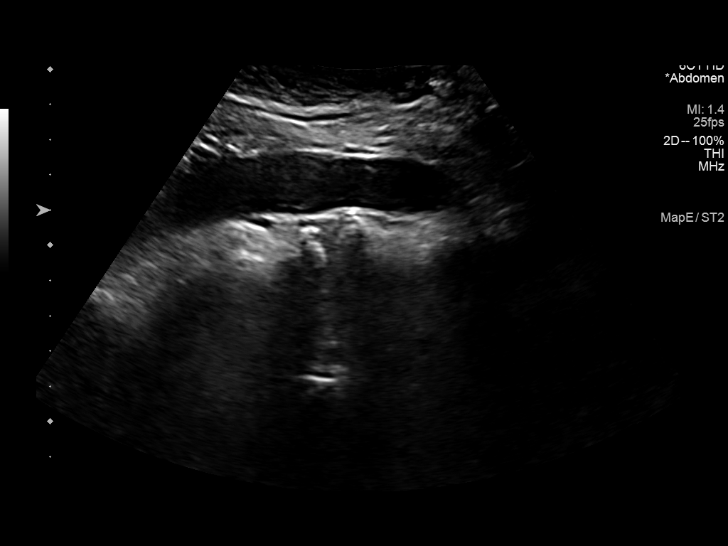
[im 10/27]
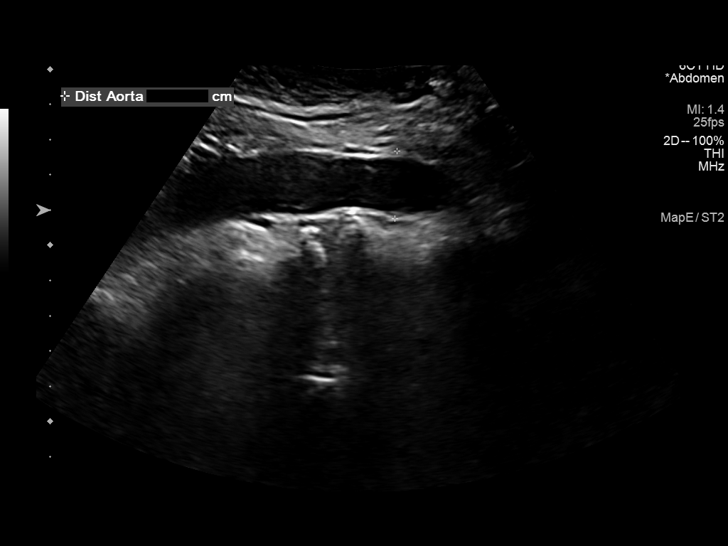
[im 12/27]
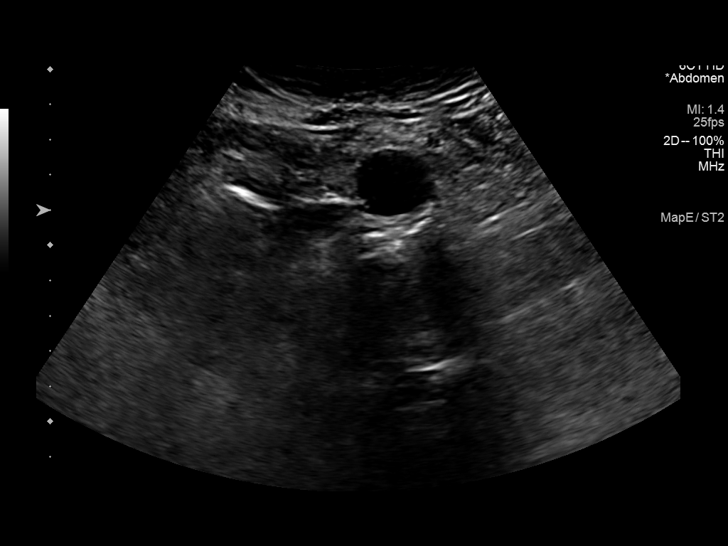
[im 15/27]
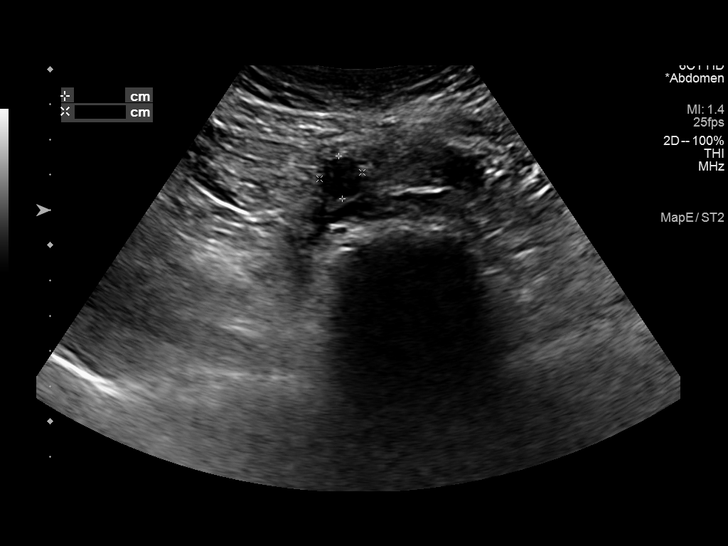
[im 17/27]
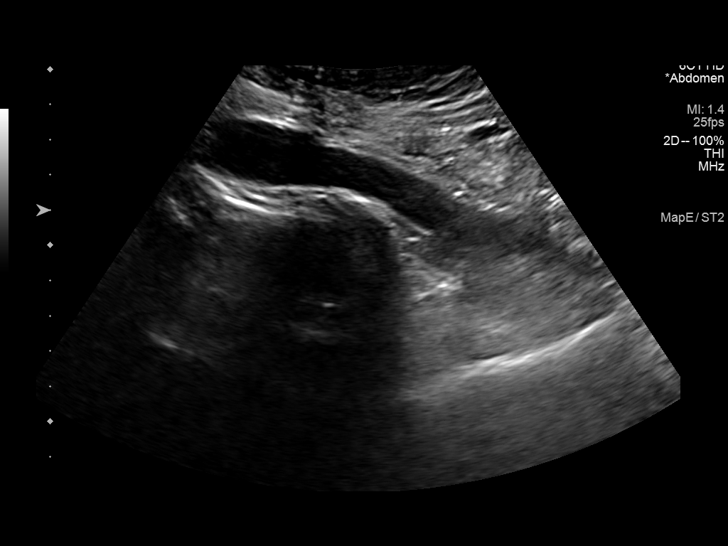
[im 18/27]
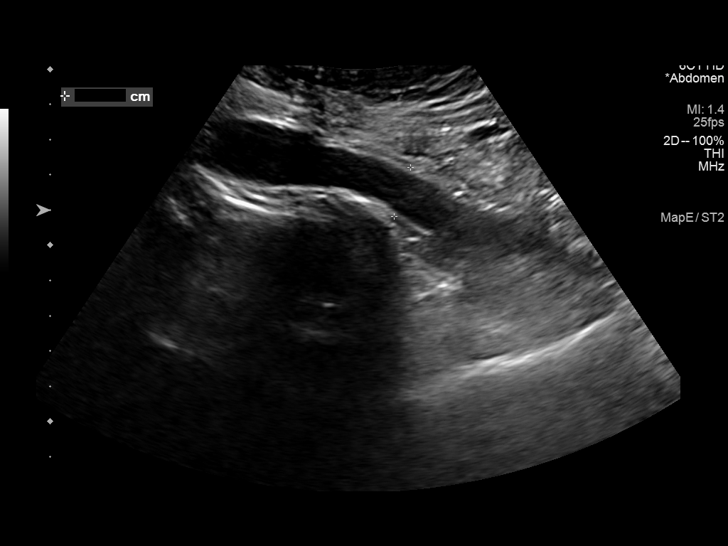
[im 20/27]
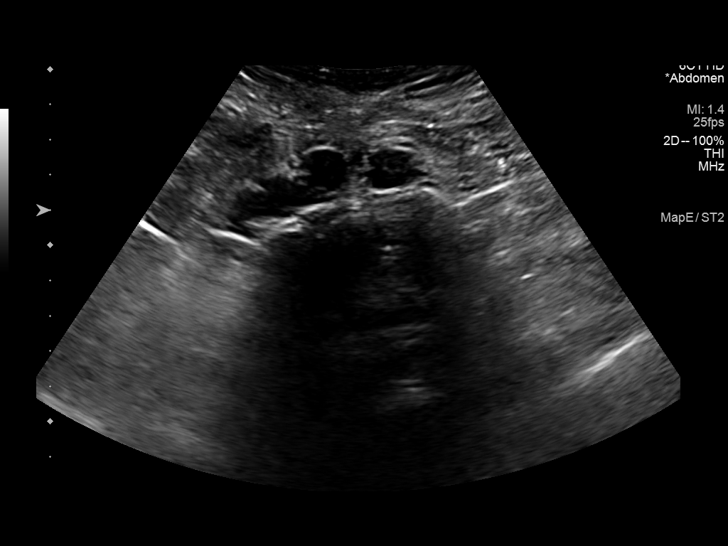
[im 22/27]
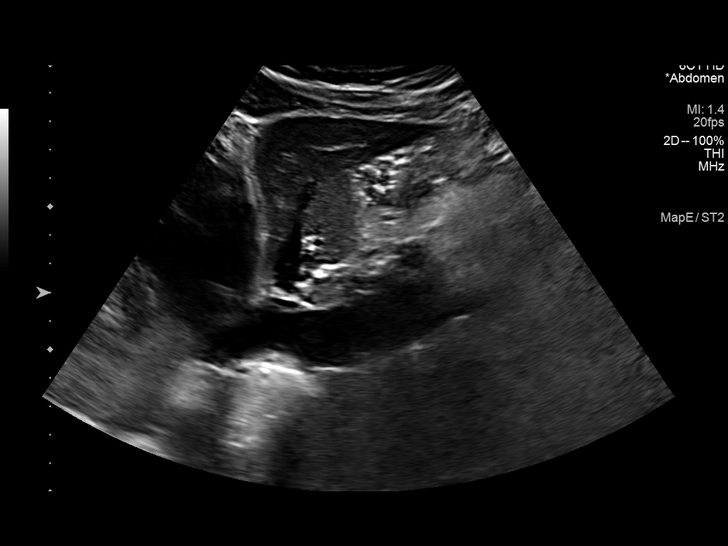
[im 24/27]
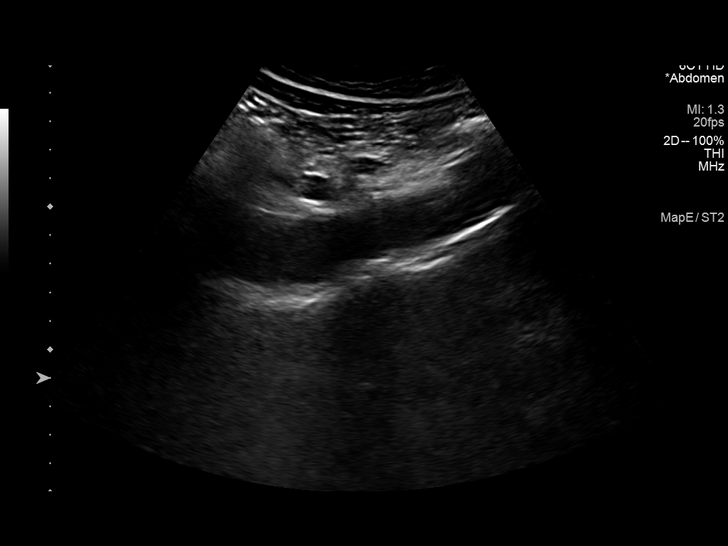
[im 27/27]
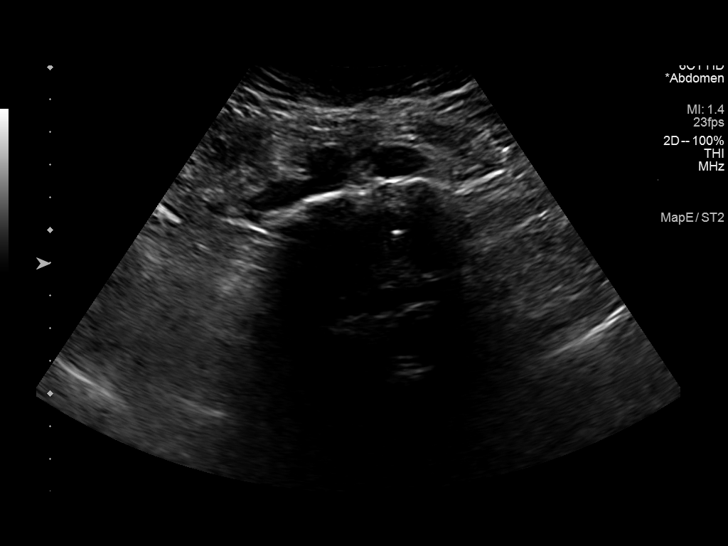

[14 of 25 positions shown; findings below may reference images not displayed]

FINDINGS: Abdominal aortic measurements as follows:

Proximal:  2.7 AP/2.4 transverse cm

Mid:  2.4 AP/2.5 transverse cm

Distal:  1.9 AP/2.4 transverse cm
IMPRESSION: No abdominal aortic aneurysm. No follow-up recommended. This
recommendation follows ACR consensus guidelines: White Paper of the
ACR Incidental Findings Committee II on Vascular Findings. [HOSPITAL] 4378; [DATE].

## 2022-10-19 ENCOUNTER — Encounter: Payer: Self-pay | Admitting: *Deleted

## 2022-10-22 ENCOUNTER — Ambulatory Visit (HOSPITAL_BASED_OUTPATIENT_CLINIC_OR_DEPARTMENT_OTHER): Payer: Medicare HMO | Admitting: Student

## 2022-10-22 ENCOUNTER — Encounter (HOSPITAL_BASED_OUTPATIENT_CLINIC_OR_DEPARTMENT_OTHER): Payer: Self-pay | Admitting: Student

## 2022-10-22 ENCOUNTER — Other Ambulatory Visit (HOSPITAL_BASED_OUTPATIENT_CLINIC_OR_DEPARTMENT_OTHER): Payer: Self-pay | Admitting: Student

## 2022-10-22 ENCOUNTER — Ambulatory Visit (HOSPITAL_BASED_OUTPATIENT_CLINIC_OR_DEPARTMENT_OTHER): Payer: Medicare HMO

## 2022-10-22 DIAGNOSIS — M25562 Pain in left knee: Secondary | ICD-10-CM

## 2022-10-22 DIAGNOSIS — M1712 Unilateral primary osteoarthritis, left knee: Secondary | ICD-10-CM

## 2022-10-22 DIAGNOSIS — M25462 Effusion, left knee: Secondary | ICD-10-CM | POA: Diagnosis not present

## 2022-10-22 MED ORDER — TRIAMCINOLONE ACETONIDE 40 MG/ML IJ SUSP
2.0000 mL | INTRAMUSCULAR | Status: AC | PRN
Start: 2022-10-22 — End: 2022-10-22
  Administered 2022-10-22: 2 mL via INTRA_ARTICULAR

## 2022-10-22 MED ORDER — LIDOCAINE HCL 1 % IJ SOLN
4.0000 mL | INTRAMUSCULAR | Status: AC | PRN
Start: 2022-10-22 — End: 2022-10-22
  Administered 2022-10-22: 4 mL

## 2022-10-22 NOTE — Progress Notes (Signed)
Chief Complaint: Left knee pain     History of Present Illness:    Jerome Shaffer is a 67 y.o. male presenting for evaluation of left knee pain.  He states that this began about 3 weeks ago after playing pickle ball.  He does not recall a distinct injury or pop, however at that time noticed some increased pain and swelling.  His pain typically increases with weightbearing and activity and is 5/10 at its worst. He reports often walking with a limp by the end of the day. This is improved with ibuprofen and rest.  He has been still able to play pickle ball and has been wearing compression sleeve to help with swelling.  Notes that he has more pain with straining of the knee.  Denies any locking or giving out of the knee.   Surgical History:   Bilateral total hip arthoplasty 2017  PMH/PSH/Family History/Social History/Meds/Allergies:    Past Medical History:  Diagnosis Date   Arthritis    Hypertension    Stroke    TIA (transient ischemic attack)    Past Surgical History:  Procedure Laterality Date   APPENDECTOMY     COLONOSCOPY     ESOPHAGOGASTRODUODENOSCOPY     EYE SURGERY     lasic, retina tear repair   TOTAL HIP ARTHROPLASTY Left 04/27/2016   Procedure: LEFT TOTAL HIP ARTHROPLASTY ANTERIOR APPROACH;  Surgeon: Samson Frederic, MD;  Location: [facility];  Service: Orthopedics;  Laterality: Left;  Needs RNFA   TOTAL HIP ARTHROPLASTY Right 06/15/2016   Procedure: RIGHT TOTAL HIP ARTHROPLASTY ANTERIOR APPROACH;  Surgeon: Samson Frederic, MD;  Location: [facility];  Service: Orthopedics;  Laterality: Right;   vasctectomy     Social History   Socioeconomic History   Marital status: Married    Spouse name: Not on file   Number of children: Not on file   Years of education: Not on file   Highest education level: Not on file  Occupational History   Not on file  Tobacco Use   Smoking status: Never   Smokeless tobacco: Never  Vaping Use   Vaping Use:  Never used  Substance and Sexual Activity   Alcohol use: Yes    Alcohol/week: 3.0 standard drinks of alcohol    Types: 3 Glasses of wine per week   Drug use: No   Sexual activity: Yes  Other Topics Concern   Not on file  Social History Narrative   Not on file   Social Determinants of Health   Financial Resource Strain: Not on file  Food Insecurity: Not on file  Transportation Needs: Not on file  Physical Activity: Not on file  Stress: Not on file  Social Connections: Not on file   History reviewed. No pertinent family history. Allergies  Allergen Reactions   No Known Allergies    Current Outpatient Medications  Medication Sig Dispense Refill   amLODipine (NORVASC) 5 MG tablet Take 5 mg by mouth daily.  1   Ascorbic Acid (VITAMIN C) 1000 MG tablet Take 1,000 mg by mouth daily.     aspirin 81 MG chewable tablet Chew 1 tablet (81 mg total) by mouth 2 (two) times daily. 60 tablet 1   Flaxseed, Linseed, (FLAXSEED OIL) 1000 MG CAPS Take 1,000 mg by mouth daily.     Ibuprofen-Diphenhydramine  HCl (ADVIL PM) 200-25 MG CAPS Take 1-2 tablets by mouth at bedtime as needed (for pain/sleep.).     Multiple Vitamin (MULTIVITAMIN WITH MINERALS) TABS tablet Take 1 tablet by mouth daily. Multivitamin for 50+     Omega-3 Fatty Acids (FISH OIL) 1200 MG CAPS Take 1,200 mg by mouth daily.     VITAMIN E PO Take 1 capsule by mouth daily.     No current facility-administered medications for this visit.   No results found.  Review of Systems:   A ROS was performed including pertinent positives and negatives as documented in the HPI.  Physical Exam :   Constitutional: NAD and appears stated age Neurological: Alert and oriented Psych: Appropriate affect and cooperative There were no vitals taken for this visit.   Comprehensive Musculoskeletal Exam:    Patient has some medial joint line tenderness.  Active range of motion 0 to 120 degrees with mild crepitus noted.  Patella is mobile and  nontender.  Stable knee joint with no laxity with varus/valgus stress and negative Lachman's.  Distal neurosensory exam intact.  Imaging:   Xray (Left knee 4 views): Narrowing of medial compartment joint space.  Mild patellofemoral arthritis with large osteophyte noted at superior patella.   I personally reviewed and interpreted the radiographs.   Assessment:   67 y.o. male with increased pain and swelling of the left knee.  He did not have a specific injury and denies any mechanical symptoms.  He does have some narrowing of the medial compartment of the left knee indicating mild to moderate osteoarthritis as well as spurring of the patella.  I believe that his symptoms are more consistent with his osteoarthritis as opposed to a meniscal/soft tissue injury.  Recommend performing ultrasound-guided cortisone injection today for symptom relief and diagnostic utility.  This was performed in clinic with no complication and patient tolerated procedure well.  Discussed to have him follow back up in clinic when symptoms return or injection relief noticeably decreases.  Plan :    - Ultrasound-guided knee injection today and follow up as needed     Procedure Note  Patient: Jerome Shaffer             Date of Birth: 1955/09/12           MRN: [id number]             Visit Date: 10/22/2022  Procedures: Visit Diagnoses:  1. Unilateral primary osteoarthritis, left knee     Large Joint Inj: L knee on 10/22/2022 10:13 AM Indications: pain Details: 22 G 1.5 in needle, ultrasound-guided anterolateral approach Medications: 4 mL lidocaine 1 %; 2 mL triamcinolone acetonide 40 MG/ML Outcome: tolerated well, no immediate complications Patient was prepped and draped in the usual sterile fashion.      I personally saw and evaluated the patient, and participated in the management and treatment plan.  Hazle Nordmann, PA-C Orthopedics  This document was dictated using Manufacturing engineer. A reasonable attempt at proof reading has been made to minimize errors.

## 2022-11-26 DIAGNOSIS — Z1331 Encounter for screening for depression: Secondary | ICD-10-CM | POA: Diagnosis not present

## 2022-11-26 DIAGNOSIS — Z0000 Encounter for general adult medical examination without abnormal findings: Secondary | ICD-10-CM | POA: Diagnosis not present

## 2022-11-26 DIAGNOSIS — L309 Dermatitis, unspecified: Secondary | ICD-10-CM | POA: Diagnosis not present

## 2022-11-26 DIAGNOSIS — Z125 Encounter for screening for malignant neoplasm of prostate: Secondary | ICD-10-CM | POA: Diagnosis not present

## 2022-11-26 DIAGNOSIS — Z9181 History of falling: Secondary | ICD-10-CM | POA: Diagnosis not present

## 2022-11-26 DIAGNOSIS — N401 Enlarged prostate with lower urinary tract symptoms: Secondary | ICD-10-CM | POA: Diagnosis not present

## 2022-11-26 DIAGNOSIS — I10 Essential (primary) hypertension: Secondary | ICD-10-CM | POA: Diagnosis not present

## 2022-11-26 DIAGNOSIS — E7800 Pure hypercholesterolemia, unspecified: Secondary | ICD-10-CM | POA: Diagnosis not present

## 2022-11-26 DIAGNOSIS — R7303 Prediabetes: Secondary | ICD-10-CM | POA: Diagnosis not present

## 2022-11-26 DIAGNOSIS — D649 Anemia, unspecified: Secondary | ICD-10-CM | POA: Diagnosis not present

## 2022-11-26 DIAGNOSIS — I77810 Thoracic aortic ectasia: Secondary | ICD-10-CM | POA: Diagnosis not present

## 2022-12-07 DIAGNOSIS — L301 Dyshidrosis [pompholyx]: Secondary | ICD-10-CM | POA: Diagnosis not present

## 2022-12-07 DIAGNOSIS — L918 Other hypertrophic disorders of the skin: Secondary | ICD-10-CM | POA: Diagnosis not present

## 2022-12-07 DIAGNOSIS — D1801 Hemangioma of skin and subcutaneous tissue: Secondary | ICD-10-CM | POA: Diagnosis not present

## 2022-12-07 DIAGNOSIS — D229 Melanocytic nevi, unspecified: Secondary | ICD-10-CM | POA: Diagnosis not present

## 2022-12-07 DIAGNOSIS — L821 Other seborrheic keratosis: Secondary | ICD-10-CM | POA: Diagnosis not present

## 2022-12-07 DIAGNOSIS — L578 Other skin changes due to chronic exposure to nonionizing radiation: Secondary | ICD-10-CM | POA: Diagnosis not present

## 2022-12-07 DIAGNOSIS — L814 Other melanin hyperpigmentation: Secondary | ICD-10-CM | POA: Diagnosis not present

## 2022-12-09 DIAGNOSIS — I77810 Thoracic aortic ectasia: Secondary | ICD-10-CM | POA: Diagnosis not present

## 2022-12-09 DIAGNOSIS — R011 Cardiac murmur, unspecified: Secondary | ICD-10-CM | POA: Diagnosis not present

## 2022-12-22 DIAGNOSIS — D649 Anemia, unspecified: Secondary | ICD-10-CM | POA: Diagnosis not present

## 2023-01-13 DIAGNOSIS — D649 Anemia, unspecified: Secondary | ICD-10-CM | POA: Diagnosis not present

## 2023-02-04 ENCOUNTER — Telehealth: Payer: Self-pay | Admitting: Orthopaedic Surgery

## 2023-02-04 NOTE — Telephone Encounter (Signed)
Received call from patient requesting release form to be emailed to him as he needs his records sent to Dr. Sherlean Foot. I emailed form to him email address on file. He will email it back to me.

## 2023-02-16 DIAGNOSIS — D649 Anemia, unspecified: Secondary | ICD-10-CM | POA: Diagnosis not present

## 2023-02-16 DIAGNOSIS — R195 Other fecal abnormalities: Secondary | ICD-10-CM | POA: Diagnosis not present

## 2023-02-18 DIAGNOSIS — M1712 Unilateral primary osteoarthritis, left knee: Secondary | ICD-10-CM | POA: Diagnosis not present

## 2023-02-26 DIAGNOSIS — I350 Nonrheumatic aortic (valve) stenosis: Secondary | ICD-10-CM | POA: Diagnosis not present

## 2023-02-26 DIAGNOSIS — Z01818 Encounter for other preprocedural examination: Secondary | ICD-10-CM | POA: Diagnosis not present

## 2023-03-01 DIAGNOSIS — M1712 Unilateral primary osteoarthritis, left knee: Secondary | ICD-10-CM | POA: Diagnosis not present

## 2023-03-01 DIAGNOSIS — S83282A Other tear of lateral meniscus, current injury, left knee, initial encounter: Secondary | ICD-10-CM | POA: Diagnosis not present

## 2023-03-01 DIAGNOSIS — M25462 Effusion, left knee: Secondary | ICD-10-CM | POA: Diagnosis not present

## 2023-03-01 DIAGNOSIS — Q686 Discoid meniscus: Secondary | ICD-10-CM | POA: Diagnosis not present

## 2023-03-17 DIAGNOSIS — R195 Other fecal abnormalities: Secondary | ICD-10-CM | POA: Diagnosis not present

## 2023-03-17 DIAGNOSIS — K5730 Diverticulosis of large intestine without perforation or abscess without bleeding: Secondary | ICD-10-CM | POA: Diagnosis not present

## 2023-03-17 DIAGNOSIS — K2930 Chronic superficial gastritis without bleeding: Secondary | ICD-10-CM | POA: Diagnosis not present

## 2023-03-17 DIAGNOSIS — K3189 Other diseases of stomach and duodenum: Secondary | ICD-10-CM | POA: Diagnosis not present

## 2023-03-17 DIAGNOSIS — B9681 Helicobacter pylori [H. pylori] as the cause of diseases classified elsewhere: Secondary | ICD-10-CM | POA: Diagnosis not present

## 2023-03-17 DIAGNOSIS — D509 Iron deficiency anemia, unspecified: Secondary | ICD-10-CM | POA: Diagnosis not present

## 2023-03-17 DIAGNOSIS — K648 Other hemorrhoids: Secondary | ICD-10-CM | POA: Diagnosis not present

## 2023-03-17 DIAGNOSIS — D120 Benign neoplasm of cecum: Secondary | ICD-10-CM | POA: Diagnosis not present

## 2023-03-19 DIAGNOSIS — E785 Hyperlipidemia, unspecified: Secondary | ICD-10-CM | POA: Diagnosis not present

## 2023-03-19 DIAGNOSIS — M1990 Unspecified osteoarthritis, unspecified site: Secondary | ICD-10-CM | POA: Diagnosis not present

## 2023-03-19 DIAGNOSIS — N400 Enlarged prostate without lower urinary tract symptoms: Secondary | ICD-10-CM | POA: Diagnosis not present

## 2023-03-19 DIAGNOSIS — Z8249 Family history of ischemic heart disease and other diseases of the circulatory system: Secondary | ICD-10-CM | POA: Diagnosis not present

## 2023-03-19 DIAGNOSIS — R011 Cardiac murmur, unspecified: Secondary | ICD-10-CM | POA: Diagnosis not present

## 2023-03-19 DIAGNOSIS — N529 Male erectile dysfunction, unspecified: Secondary | ICD-10-CM | POA: Diagnosis not present

## 2023-03-19 DIAGNOSIS — R7303 Prediabetes: Secondary | ICD-10-CM | POA: Diagnosis not present

## 2023-03-19 DIAGNOSIS — Z7982 Long term (current) use of aspirin: Secondary | ICD-10-CM | POA: Diagnosis not present

## 2023-03-19 DIAGNOSIS — Z96649 Presence of unspecified artificial hip joint: Secondary | ICD-10-CM | POA: Diagnosis not present

## 2023-03-19 DIAGNOSIS — I10 Essential (primary) hypertension: Secondary | ICD-10-CM | POA: Diagnosis not present

## 2023-03-19 DIAGNOSIS — Z809 Family history of malignant neoplasm, unspecified: Secondary | ICD-10-CM | POA: Diagnosis not present

## 2023-03-19 DIAGNOSIS — L309 Dermatitis, unspecified: Secondary | ICD-10-CM | POA: Diagnosis not present

## 2023-03-26 DIAGNOSIS — D120 Benign neoplasm of cecum: Secondary | ICD-10-CM | POA: Diagnosis not present

## 2023-03-26 DIAGNOSIS — K2930 Chronic superficial gastritis without bleeding: Secondary | ICD-10-CM | POA: Diagnosis not present

## 2023-04-06 DIAGNOSIS — M1712 Unilateral primary osteoarthritis, left knee: Secondary | ICD-10-CM | POA: Diagnosis not present

## 2023-04-20 DIAGNOSIS — M1712 Unilateral primary osteoarthritis, left knee: Secondary | ICD-10-CM | POA: Diagnosis not present

## 2023-04-29 DIAGNOSIS — D509 Iron deficiency anemia, unspecified: Secondary | ICD-10-CM | POA: Diagnosis not present

## 2023-04-29 DIAGNOSIS — K921 Melena: Secondary | ICD-10-CM | POA: Diagnosis not present

## 2023-05-31 ENCOUNTER — Ambulatory Visit (HOSPITAL_COMMUNITY)
Admission: RE | Admit: 2023-05-31 | Discharge: 2023-05-31 | Disposition: A | Discharge status: Home / Self Care | Payer: Medicare HMO | Source: Ambulatory Visit | Attending: Family Medicine | Admitting: Family Medicine

## 2023-05-31 ENCOUNTER — Other Ambulatory Visit (HOSPITAL_COMMUNITY): Payer: Self-pay | Admitting: Family Medicine

## 2023-05-31 DIAGNOSIS — R200 Anesthesia of skin: Secondary | ICD-10-CM

## 2023-05-31 DIAGNOSIS — M4803 Spinal stenosis, cervicothoracic region: Secondary | ICD-10-CM | POA: Diagnosis not present

## 2023-05-31 DIAGNOSIS — M4312 Spondylolisthesis, cervical region: Secondary | ICD-10-CM | POA: Diagnosis not present

## 2023-05-31 DIAGNOSIS — G936 Cerebral edema: Secondary | ICD-10-CM | POA: Diagnosis not present

## 2023-06-01 DIAGNOSIS — Z01818 Encounter for other preprocedural examination: Secondary | ICD-10-CM | POA: Diagnosis not present

## 2023-06-01 DIAGNOSIS — M1712 Unilateral primary osteoarthritis, left knee: Secondary | ICD-10-CM | POA: Diagnosis not present

## 2023-06-15 ENCOUNTER — Ambulatory Visit: Payer: Medicare HMO | Admitting: Neurology

## 2023-06-15 ENCOUNTER — Telehealth: Payer: Self-pay | Admitting: Neurology

## 2023-06-15 ENCOUNTER — Encounter: Payer: Self-pay | Admitting: Neurology

## 2023-06-15 VITALS — BP 115/73 | HR 74 | Ht 65.0 in | Wt 142.0 lb

## 2023-06-15 DIAGNOSIS — Z8673 Personal history of transient ischemic attack (TIA), and cerebral infarction without residual deficits: Secondary | ICD-10-CM

## 2023-06-15 DIAGNOSIS — I63411 Cerebral infarction due to embolism of right middle cerebral artery: Secondary | ICD-10-CM | POA: Diagnosis not present

## 2023-06-15 DIAGNOSIS — R200 Anesthesia of skin: Secondary | ICD-10-CM | POA: Diagnosis not present

## 2023-06-15 MED ORDER — CLOPIDOGREL BISULFATE 75 MG PO TABS: 75.0000 mg | ORAL_TABLET | Freq: Every day | ORAL | 11 refills | Status: DC

## 2023-06-15 NOTE — Telephone Encounter (Signed)
Pt called to confirm what pharmacy we have on file- [facility]

## 2023-06-15 NOTE — Patient Instructions (Signed)
I had a long d/w patient about his recent cryptogenic embolic stroke, risk for recurrent stroke/TIAs, personally independently reviewed imaging studies and stroke evaluation results and answered questions discontinue aspirin and switch to Plavix 75 mg daily   for secondary stroke prevention and maintain strict control of hypertension with blood pressure goal below 130/90, diabetes with hemoglobin A1c goal below 6.5% and lipids with LDL cholesterol goal below 70 mg/dL. I also advised the patient to eat a healthy diet with plenty of whole grains, cereals, fruits and vegetables, exercise regularly and maintain ideal body weight .check MR angiogram study of the brain and neck, echocardiogram and loop recorder for paroxysmal A-fib.  Patient wants to undergo left knee replacement surgery I recommend he wait at least 3 months since his stroke since that is the maximum risk.  for recurrent stroke. followup in the future with me in 4 months or call earlier if necessary.  Stroke Prevention Some medical conditions and behaviors can lead to a higher chance of having a stroke. You can help prevent a stroke by eating healthy, exercising, not smoking, and managing any medical conditions you have. Stroke is a leading cause of functional impairment. Primary prevention is particularly important because a majority of strokes are first-time events. Stroke changes the lives of not only those who experience a stroke but also their family and other caregivers. How can this condition affect me? A stroke is a medical emergency and should be treated right away. A stroke can lead to brain damage and can sometimes be life-threatening. If a person gets medical treatment right away, there is a better chance of surviving and recovering from a stroke. What can increase my risk? The following medical conditions may increase your risk of a stroke: Cardiovascular disease. High blood pressure (hypertension). Diabetes. High  cholesterol. Sickle cell disease. Blood clotting disorders (hypercoagulable state). Obesity. Sleep disorders (obstructive sleep apnea). Other risk factors include: Being older than age 90. Having a history of blood clots, stroke, or mini-stroke (transient ischemic attack, TIA). Genetic factors, such as race, ethnicity, or a family history of stroke. Smoking cigarettes or using other tobacco products. Taking birth control pills, especially if you also use tobacco. Heavy use of alcohol or drugs, especially cocaine and methamphetamine. Physical inactivity. What actions can I take to prevent this? Manage your health conditions High cholesterol levels. Eating a healthy diet is important for preventing high cholesterol. If cholesterol cannot be managed through diet alone, you may need to take medicines. Take any prescribed medicines to control your cholesterol as told by your health care provider. Hypertension. To reduce your risk of stroke, try to keep your blood pressure below 130/80. Eating a healthy diet and exercising regularly are important for controlling blood pressure. If these steps are not enough to manage your blood pressure, you may need to take medicines. Take any prescribed medicines to control hypertension as told by your health care provider. Ask your health care provider if you should monitor your blood pressure at home. Have your blood pressure checked every year, even if your blood pressure is normal. Blood pressure increases with age and some medical conditions. Diabetes. Eating a healthy diet and exercising regularly are important parts of managing your blood sugar (glucose). If your blood sugar cannot be managed through diet and exercise, you may need to take medicines. Take any prescribed medicines to control your diabetes as told by your health care provider. Get evaluated for obstructive sleep apnea. Talk to your health care provider about getting  a sleep evaluation if  you snore a lot or have excessive sleepiness. Make sure that any other medical conditions you have, such as atrial fibrillation or atherosclerosis, are managed. Nutrition Follow instructions from your health care provider about what to eat or drink to help manage your health condition. These instructions may include: Reducing your daily calorie intake. Limiting how much salt (sodium) you use to 1,500 milligrams (mg) each day. Using only healthy fats for cooking, such as olive oil, canola oil, or sunflower oil. Eating healthy foods. You can do this by: Choosing foods that are high in fiber, such as whole grains, and fresh fruits and vegetables. Eating at least 5 servings of fruits and vegetables a day. Try to fill one-half of your plate with fruits and vegetables at each meal. Choosing lean protein foods, such as lean cuts of meat, poultry without skin, fish, tofu, beans, and nuts. Eating low-fat dairy products. Avoiding foods that are high in sodium. This can help lower blood pressure. Avoiding foods that have saturated fat, trans fat, and cholesterol. This can help prevent high cholesterol. Avoiding processed and prepared foods. Counting your daily carbohydrate intake.  Lifestyle If you drink alcohol: Limit how much you have to: 0-1 drink a day for women who are not pregnant. 0-2 drinks a day for men. Know how much alcohol is in your drink. In the [place] one drink equals one 12 oz bottle of beer ( ), one 5 oz glass of wine ( ), or one 1 oz glass of hard liquor (44mL). Do not use any products that contain nicotine or tobacco. These products include cigarettes, chewing tobacco, and vaping devices, such as e-cigarettes. If you need help quitting, ask your health care provider. Avoid secondhand smoke. Do not use drugs. Activity  Try to stay at a healthy weight. Get at least 30 minutes of exercise on most days, such as: Fast walking. Biking. Swimming. Medicines Take  over-the-counter and prescription medicines only as told by your health care provider. Aspirin or blood thinners (antiplatelets or anticoagulants) may be recommended to reduce your risk of forming blood clots that can lead to stroke. Avoid taking birth control pills. Talk to your health care provider about the risks of taking birth control pills if: You are over 8 years old. You smoke. You get very bad headaches. You have had a blood clot. Where to find more information American Stroke Association: www.strokeassociation.org Get help right away if: You or a loved one has any symptoms of a stroke. "BE FAST" is an easy way to remember the main warning signs of a stroke: B - Balance. Signs are dizziness, sudden trouble walking, or loss of balance. E - Eyes. Signs are trouble seeing or a sudden change in vision. F - Face. Signs are sudden weakness or numbness of the face, or the face or eyelid drooping on one side. A - Arms. Signs are weakness or numbness in an arm. This happens suddenly and usually on one side of the body. S - Speech. Signs are sudden trouble speaking, slurred speech, or trouble understanding what people say. T - Time. Time to call emergency services. Write down what time symptoms started. You or a loved one has other signs of a stroke, such as: A sudden, severe headache with no known cause. Nausea or vomiting. Seizure. These symptoms may represent a serious problem that is an emergency. Do not wait to see if the symptoms will go away. Get medical help right away. Call your local emergency services (911  in the [place] Do not drive yourself to the hospital. Summary You can help to prevent a stroke by eating healthy, exercising, not smoking, limiting alcohol intake, and managing any medical conditions you may have. Do not use any products that contain nicotine or tobacco. These include cigarettes, chewing tobacco, and vaping devices, such as e-cigarettes. If you need help quitting,  ask your health care provider. Remember "BE FAST" for warning signs of a stroke. Get help right away if you or a loved one has any of these signs. This information is not intended to replace advice given to you by your health care provider. Make sure you discuss any questions you have with your health care provider. Document Revised: 05/25/2022 Document Reviewed: 05/25/2022 Elsevier Patient Education  2024 [employer].

## 2023-06-15 NOTE — Progress Notes (Addendum)
[facility] [address] [place]. [place] [address] [phone]       OFFICE CONSULT NOTE  [name] Date of Birth:  Dec 10, 1955 Medical Record Number:  [id number]   Referring MD: Jerome Marshall, PA-C  Reason for Referral: Stroke  HPI: [name] is a pleasant 67 year old Caucasian male seen today for initial office consultation visit for stroke.  History is obtained from the patient and review of electronic medical records and I personally reviewed pertinent available imaging films in PACS.  He has past medical history of hypertension, arthritis and right hemispheric TIA in 2017 who on 05/17/2023 developed sudden onset of left upper extremity numbness involving the medial 3 fingers of the left hand up to the wrist.  He initially thought he had a pinched nerve but he has not slept on anything unusual.  The tingling improved after a day or so but he still has some residual numbness.  He had an outpatient MRI scan done on 05/31/2023 which shows a right parietal postcentral gyrus cortical infarct.  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  There was moderate foraminal stenosis on the right at C2-3, C4-5, C5-6 and C7-T1.  Patient denied any headache, hand weakness, gait or balance difficulties.  Denies any prior history of palpitations, atrial fibrillation, syncopal events or cardiac arrhythmias.  He has been diagnosed with a cardiac murmur and does follow-up with a cardiologist.  He had been on aspirin 81 mg daily following his right hemispheric TIA in 2017.  At that time he was visiting another state when he developed sudden onset of lack of control in the left hand with numbness.  That lasted about an hour or 2 and recovered completely.  He remembers having extensive workup in the hospital then including MRI scan echocardiogram as well as a TEE all of which was  unremarkable and he was asked to control his blood pressure and take aspirin every day.  He denies any family history of strokes or TIAs.  He does not smoke cigarettes and drinks alcohol only socially.  He denies history of sleep apnea.  Lab work on 05/13/2023 showed LDL-cholesterol to be 60 mg percent and hemoglobin A1c to be 6.2.  He is currently on Lipitor 10 mg daily and aspirin 81 mg daily.  ROS:   14 system review of systems is positive for numbness, tingling, weakness, knee pain, gait difficulty all other systems negative  PMH:  Past Medical History:  Diagnosis Date   Arthritis    Hypertension    Stroke (HCC)    TIA (transient ischemic attack)     Social History:  Social History   Socioeconomic History   Marital status: Married    Spouse name: Not on file   Number of children: Not on file   Years of education: Not on file   Highest education level: Not on file  Occupational History   Not on file  Tobacco Use   Smoking status: Never   Smokeless tobacco: Never  Vaping Use   Vaping status: Never Used  Substance and Sexual Activity   Alcohol use: Yes    Alcohol/week: 3.0 standard drinks of alcohol    Types: 3 Glasses of wine per week   Drug use: No   Sexual activity: Yes  Other Topics Concern   Not on file  Social History Narrative   Not on file  Social Determinants of Health   Financial Resource Strain: Not on file  Food Insecurity: Not on file  Transportation Needs: Not on file  Physical Activity: Not on file  Stress: Not on file  Social Connections: Not on file  Intimate Partner Violence: Not on file    Medications:   Current Outpatient Medications on File Prior to Visit  Medication Sig Dispense Refill   atorvastatin (LIPITOR) 10 MG tablet Take 10 mg by mouth daily.     tadalafil (CIALIS) 5 MG tablet Take 5 mg by mouth daily as needed.     amLODipine (NORVASC) 5 MG tablet Take 5 mg by mouth daily.  1   Ascorbic Acid (VITAMIN C) 1000 MG tablet Take 1,000  mg by mouth daily.     aspirin 81 MG chewable tablet Chew 1 tablet (81 mg total) by mouth 2 (two) times daily. (Patient taking differently: Chew 324 mg by mouth 2 (two) times daily.) 60 tablet 1   Flaxseed, Linseed, (FLAXSEED OIL) 1000 MG CAPS Take 1,000 mg by mouth daily.     Ibuprofen-Diphenhydramine HCl (ADVIL PM) 200-25 MG CAPS Take 1-2 tablets by mouth at bedtime as needed (for pain/sleep.).     Multiple Vitamin (MULTIVITAMIN WITH MINERALS) TABS tablet Take 1 tablet by mouth daily. Multivitamin for 50+     Omega-3 Fatty Acids (FISH OIL) 1200 MG CAPS Take 1,200 mg by mouth daily.     VITAMIN E PO Take 1 capsule by mouth daily.     No current facility-administered medications on file prior to visit.    Allergies:   Allergies  Allergen Reactions   No Known Allergies     Physical Exam General: well developed, well nourished pleasant middle-age Caucasian male, seated, in no evident distress Head: head normocephalic and atraumatic.   Neck: supple with no carotid or supraclavicular bruits Cardiovascular: regular rate and rhythm, no murmurs Musculoskeletal: no deformity Skin:  no rash/petichiae Vascular:  Normal pulses all extremities  Neurologic Exam Mental Status: Awake and fully alert. Oriented to place and time. Recent and remote memory intact. Attention span, concentration and fund of knowledge appropriate. Mood and affect appropriate.  Cranial Nerves: Fundoscopic exam reveals sharp disc margins. Pupils equal, briskly reactive to light. Extraocular movements full without nystagmus. Visual fields full to confrontation. Hearing intact. Facial sensation intact. Face, tongue, palate moves normally and symmetrically.  Motor: Normal bulk and tone. Normal strength in all tested extremity muscles. Sensory.:  Diminished touch and pinprick sensation in the left hand in the medial 2 fingers up to the wrist. Coordination: Rapid alternating movements normal in all extremities. Finger-to-nose and  heel-to-shin performed accurately bilaterally. Gait and Station: Arises from chair without difficulty. Stance is normal. Gait demonstrates normal stride length and balance . Able to heel, toe and tandem walk with slight difficulty.  Reflexes: 1+ and symmetric. Toes downgoing.   NIHSS  1 Modified Rankin  2   ASSESSMENT: 67 year old Caucasian male with embolic right parietal MCA branch infarct in November 2024 with residual mild left hand numbness.  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  Vascular risk factors of mild hyperlipidemia , borderline diabetes and previous TIA in 2017     PLAN:I had a long d/w patient about his recent cryptogenic embolic  stroke, risk for recurrent stroke/TIAs, personally independently reviewed imaging studies and stroke evaluation results and answered questions discontinue aspirin and switch to Plavix 75 mg daily   for secondary stroke prevention and maintain strict control of hypertension with blood pressure goal below 130/90, diabetes with hemoglobin A1c goal below 6.5% and lipids with LDL cholesterol goal below 70 mg/dL. I also advised the patient to eat a healthy diet with plenty of whole grains, cereals, fruits and vegetables, exercise regularly and maintain ideal body weight .check MR angiogram study of the brain and neck, echocardiogram and loop recorder for paroxysmal A-fib.  Patient wants to undergo left knee replacement surgery I recommend he wait at least 3 months since his stroke since that is the maximum risk.  for recurrent stroke. followup in the future with me in 4 months or call earlier if necessary. Greater than 50% time during this 45-minute consultation visit was spent in counseling and  coordination of care about his embolic stroke and discussion about need for further stroke evaluation and treatment and prevention and answering questions Delia Heady, MD Note: This document was prepared with digital dictation and possible smart phrase technology. Any transcriptional errors that result from this process are unintentional.

## 2023-06-16 DIAGNOSIS — A048 Other specified bacterial intestinal infections: Secondary | ICD-10-CM | POA: Diagnosis not present

## 2023-06-17 ENCOUNTER — Telehealth: Payer: Self-pay | Admitting: Neurology

## 2023-06-17 NOTE — Telephone Encounter (Signed)
sent to GI they obtain [employer] medicare auth [id number] 

## 2023-06-21 ENCOUNTER — Telehealth: Payer: Self-pay | Admitting: Neurology

## 2023-06-21 NOTE — Telephone Encounter (Signed)
Pt returning call. Asking if he should wait for someone to call him regarding MRA & echocardiogram or should he f/u. Also asking if the forms he dropped off last week have been faxed to his knee Dr.

## 2023-06-21 NOTE — Telephone Encounter (Signed)
Called the patient and he wasn't able to talk at this time. He asked me to call back in about 5 minutes. Advised the pt to call our office back when he has a moment.  ** when the patient calls back, if I am unable to take it, please find out what questions he may have and see if this is something we can look up and answer quickly for the pt.  Looks like DrSethi recommended at the last visit patient to start taking plavix 75 mg daily for secondary stroke prevention. He talked about diet changes to help with cholesterol levels. He has placed orders for the patient to complete  MRA angio of head and neck, echocardiogram and telemetry monitoring (loop recorder placement through cardiology). Please see if there is any other questions or concerns pt has.

## 2023-06-21 NOTE — Telephone Encounter (Signed)
Pt states he has questions about the instructions given at last visit. Requesting call back

## 2023-06-24 ENCOUNTER — Telehealth: Payer: Self-pay | Admitting: Neurology

## 2023-06-24 NOTE — Telephone Encounter (Signed)
Pt is asking for a call to discuss this response from [place], [place]

## 2023-06-24 NOTE — Telephone Encounter (Signed)
Received a notification from the patient that he is in need of a surgical clearance to have a left total knee replacement. Dr Pearlean Brownie recommends waiting 3-6 months post stroke before doing elective surgery.  The orthopedic is booking out into February with elective surgeries and his stroke was found via MRI on 05/31/2023.   Dr. Pearlean Brownie would clear the patient for the surgery after 08/30/2022 as long as  Jerome Shaffer remains stable, with no further stroke like symptoms. Once surgery is scheduled, Jerome Shaffer will hold Plavix 3-5 days prior to the planned procedure. Jerome Shaffer would be at low but acceptable risk of reoccurring stroke while off therapy and must restart the post operatively.   If there are any other questions or concerns the orthopedic office can reach out to our office.

## 2023-06-29 NOTE — Telephone Encounter (Signed)
Clearance has been faxed and confirmed.

## 2023-07-01 ENCOUNTER — Encounter: Payer: Self-pay | Admitting: Neurology

## 2023-07-05 ENCOUNTER — Encounter: Payer: Self-pay | Admitting: Neurology

## 2023-07-09 ENCOUNTER — Ambulatory Visit
Admission: RE | Admit: 2023-07-09 | Discharge: 2023-07-09 | Disposition: A | Discharge status: Home / Self Care | Payer: Medicare HMO | Source: Ambulatory Visit | Attending: Neurology | Admitting: Neurology

## 2023-07-09 DIAGNOSIS — I63411 Cerebral infarction due to embolism of right middle cerebral artery: Secondary | ICD-10-CM | POA: Diagnosis not present

## 2023-07-09 MED ORDER — GADOPICLENOL 0.5 MMOL/ML IV SOLN: 7.0000 mL | Freq: Once | INTRAVENOUS | Status: DC | PRN

## 2023-07-09 MED ORDER — GADOPICLENOL 0.5 MMOL/ML IV SOLN
7.0000 mL | Freq: Once | INTRAVENOUS | Status: AC | PRN
  Administered 2023-07-09: 7 mL via INTRAVENOUS

## 2023-07-13 ENCOUNTER — Telehealth: Payer: Self-pay

## 2023-07-13 NOTE — Telephone Encounter (Signed)
Called pt and LVM to give us a call back

## 2023-07-13 NOTE — Telephone Encounter (Signed)
-----   Message from Delia Heady sent at 07/13/2023  8:07 AM EST ----- Kindly inform the patient that MR angiogram study of the blood vessels of the neck and the brain both do not show any major blockages to worry about.

## 2023-07-15 NOTE — Telephone Encounter (Signed)
 Called pt no answer sent results through [facility]

## 2023-08-10 ENCOUNTER — Ambulatory Visit: Payer: Medicare HMO | Attending: Internal Medicine | Admitting: Internal Medicine

## 2023-08-10 ENCOUNTER — Encounter: Payer: Self-pay | Admitting: Internal Medicine

## 2023-08-10 VITALS — BP 104/66 | HR 84 | Ht 65.0 in | Wt 147.8 lb

## 2023-08-10 DIAGNOSIS — I639 Cerebral infarction, unspecified: Secondary | ICD-10-CM | POA: Diagnosis not present

## 2023-08-10 DIAGNOSIS — I350 Nonrheumatic aortic (valve) stenosis: Secondary | ICD-10-CM

## 2023-08-10 NOTE — Patient Instructions (Addendum)
Medication Instructions:  Your physician recommends that you continue on your current medications as directed. Please refer to the Current Medication list given to you today.  *If you need a refill on your cardiac medications before your next appointment, please call your pharmacy*   Lab Work: None ordered.  If you have labs (blood work) drawn today and your tests are completely normal, you will receive your results only by: MyChart Message (if you have MyChart) OR A paper copy in the mail If you have any lab test that is abnormal or we need to change your treatment, we will call you to review the results.   Testing/Procedures: Your physician has requested that you have an echocardiogram. Echocardiography is a painless test that uses sound waves to create images of your heart. It provides your doctor with information about the size and shape of your heart and how well your heart's chambers and valves are working. This procedure takes approximately one hour. There are no restrictions for this procedure. Please do NOT wear cologne, perfume, aftershave, or lotions (deodorant is allowed). Please arrive 15 minutes prior to your appointment time.  Please note: We ask at that you not bring children with you during ultrasound (echo/ vascular) testing. Due to room size and safety concerns, children are not allowed in the ultrasound rooms during exams. Our front office staff cannot provide observation of children in our lobby area while testing is being conducted. An adult accompanying a patient to their appointment will only be allowed in the ultrasound room at the discretion of the ultrasound technician under special circumstances. We apologize for any inconvenience.    Follow-Up: At [facility], you and your health needs are our priority.  As part of our continuing mission to provide you with exceptional heart care, we have created designated Provider Care Teams.  These Care Teams include  your primary Cardiologist (physician) and Advanced Practice Providers (APPs -  Physician Assistants and Nurse Practitioners) who all work together to provide you with the care you need, when you need it.  We recommend signing up for the patient portal called "MyChart".  Sign up information is provided on this After Visit Summary.  MyChart is used to connect with patients for Virtual Visits (Telemedicine).  Patients are able to view lab/test results, encounter notes, upcoming appointments, etc.  Non-urgent messages can be sent to your provider as well.   To learn more about what you can do with MyChart, go to [email or web address].    Your next appointment:   To be determined - based on approval for Loop Recorder Implant+

## 2023-08-10 NOTE — Progress Notes (Signed)
 ELECTROPHYSIOLOGY CONSULT NOTE  Patient ID: Jerome Shaffer, MRN: [id number], DOB/AGE: 10-Sep-1955 68 y.o. Admit date: (Not on file) Date of Consult: 08/10/2023  Primary Physician: Alben Therisa MATSU, PA Primary Cardiologist: new     Jerome Shaffer is a 68 y.o. male who is being seen today for the evaluation of cryptogenic stroke at the request of Dr PS.    HPI Jerome Shaffer is a 68 y.o. male referred for Loop recorder insertion for prior TIA and subsequent stroke   TIA--R hemispheric 2017; loss of L hand MRI scan 11/24  >> parietal postcentral gyrus cortical infarct.  ( R parietal MCA) w residual L hand numbness  The patient denies chest pain, shortness of breath, nocturnal dyspnea, orthopnea or peripheral edema.  There have been no palpitations, lightheadedness or syncope.   Ultrasound screening for positive family history no aneurysm 6/23  The patient denies chest pain, shortness of breath, nocturnal dyspnea, orthopnea or peripheral edema.  There have been no palpitations, lightheadedness or syncope.     Date Cr K Hgb LDL  11/24   5.0 15.3 60          EtOH-wine 7.5 ounces 2-4 d/week   Past Medical History:  Diagnosis Date   Arthritis    Hypertension    Stroke [facility])    TIA (transient ischemic attack)       Surgical History:  Past Surgical History:  Procedure Laterality Date   APPENDECTOMY     COLONOSCOPY     ESOPHAGOGASTRODUODENOSCOPY     EYE SURGERY     lasic, retina tear repair   TOTAL HIP ARTHROPLASTY Left 04/27/2016   Procedure: LEFT TOTAL HIP ARTHROPLASTY ANTERIOR APPROACH;  Surgeon: Redell Shoals, MD;  Location: [facility] OR;  Service: Orthopedics;  Laterality: Left;  Needs RNFA   TOTAL HIP ARTHROPLASTY Right 06/15/2016   Procedure: RIGHT TOTAL HIP ARTHROPLASTY ANTERIOR APPROACH;  Surgeon: Redell Shoals, MD;  Location: [facility] OR;  Service: Orthopedics;  Laterality: Right;   vasctectomy       Home Meds: Current Meds  Medication Sig   amLODipine   (NORVASC ) 5 MG tablet Take 5 mg by mouth daily.   Ascorbic Acid  (VITAMIN C ) 1000 MG tablet Take 1,000 mg by mouth daily.   atorvastatin  (LIPITOR) 10 MG tablet Take 10 mg by mouth daily.   clopidogrel  (PLAVIX ) 75 MG tablet Take 1 tablet (75 mg total) by mouth daily.   Flaxseed, Linseed, (FLAXSEED OIL) 1000 MG CAPS Take 1,000 mg by mouth daily.   Ibuprofen-Diphenhydramine  HCl (ADVIL PM) 200-25 MG CAPS Take 1-2 tablets by mouth at bedtime as needed (for pain/sleep.).   Multiple Vitamin (MULTIVITAMIN WITH MINERALS) TABS tablet Take 1 tablet by mouth daily. Multivitamin for 50+   olmesartan (BENICAR) 5 MG tablet Take 5 mg by mouth daily.   Omega-3 Fatty Acids (FISH OIL) 1200 MG CAPS Take 1,200 mg by mouth daily.   tadalafil (CIALIS) 5 MG tablet Take 5 mg by mouth daily as needed.   VITAMIN D, CHOLECALCIFEROL, PO Take by mouth. daily    Allergies:  Allergies  Allergen Reactions   No Known Allergies     Social History   Socioeconomic History   Marital status: Married    Spouse name: Not on file   Number of children: Not on file   Years of education: Not on file   Highest education level: Not on file  Occupational History   Not on file  Tobacco Use   Smoking  status: Never   Smokeless tobacco: Never  Vaping Use   Vaping status: Never Used  Substance and Sexual Activity   Alcohol  use: Yes    Alcohol /week: 3.0 standard drinks of alcohol     Types: 3 Glasses of wine per week   Drug use: No   Sexual activity: Yes  Other Topics Concern   Not on file  Social History Narrative   Not on file   Social Drivers of Health   Financial Resource Strain: Not on file  Food Insecurity: Not on file  Transportation Needs: Not on file  Physical Activity: Not on file  Stress: Not on file  Social Connections: Not on file  Intimate Partner Violence: Not on file     History reviewed. No pertinent family history.   ROS:  Please see the history of present illness.     All other systems reviewed  and negative.    Physical Exam: Blood pressure 104/66, pulse 84, height 5' 5 (1.651 m), weight 147 lb 12.8 oz (67 kg), SpO2 98%. General: Well developed, well nourished male in no acute distress. Head: Normocephalic, atraumatic, sclera non-icteric, no xanthomas, nares are without discharge. EENT: normal  Lymph Nodes:  none Neck: Negative for carotid bruits. JVD not elevated. Back:without scoliosis kyphosis  asymmetry and hypertrophy of R back  Lungs: Clear bilaterally to auscultation without wheezes, rales, or rhonchi. Breathing is unlabored. Heart: RRR with S1 Split S2. 3/6 systolic murmur . No rubs, or gallops appreciated. Abdomen: Soft, non-tender, non-distended with normoactive bowel sounds. No hepatomegaly. No rebound/guarding. No obvious abdominal masses. Msk:  Strength and tone appear normal for age. Extremities: No clubbing or cyanosis. No edema.  Distal pedal pulses are 2+ and equal bilaterally. Skin: Warm and Dry Neuro: Alert and oriented X 3. CN III-XII intact Grossly normal sensory and motor function . Psych:  Responds to questions appropriately with a normal affect.        EKG: Sinus at 84 Normal 13/08/38    Assessment and Plan:  Cryptogenic stroke  TIA  Hypertension-treated  PVCs  Aortic outflow murmur  Family history ruptured aneurysm   Recurrent neurological events with a remote TIA recent cryptogenic stroke.  Reasonably considered for loop recorder insertion looking for atrial fibrillation to guide recommendations regarding anticoagulation versus antiplatelet therapy.  Aortic valve disease.  Will repeat his ultrasound.  Split S2 suggested disease will be mild  We will try and sort out also whether his father's fatal aneurysm was in the abdomen in which case his screening is done and sufficient or it was thoracic in which case it should probably be looked at.  It also raises the possibility as to whether his aortic valve disease is bicuspid.  We will  see.    Elspeth Sage

## 2023-08-18 DIAGNOSIS — G8929 Other chronic pain: Secondary | ICD-10-CM | POA: Diagnosis not present

## 2023-08-18 DIAGNOSIS — M1712 Unilateral primary osteoarthritis, left knee: Secondary | ICD-10-CM | POA: Diagnosis not present

## 2023-08-18 DIAGNOSIS — M25562 Pain in left knee: Secondary | ICD-10-CM | POA: Diagnosis not present

## 2023-08-27 ENCOUNTER — Ambulatory Visit (HOSPITAL_COMMUNITY): Payer: Medicare HMO | Attending: Cardiology

## 2023-08-27 DIAGNOSIS — S299XXA Unspecified injury of thorax, initial encounter: Secondary | ICD-10-CM | POA: Diagnosis not present

## 2023-08-27 DIAGNOSIS — E7800 Pure hypercholesterolemia, unspecified: Secondary | ICD-10-CM | POA: Diagnosis not present

## 2023-08-27 DIAGNOSIS — I6389 Other cerebral infarction: Secondary | ICD-10-CM | POA: Diagnosis not present

## 2023-08-27 DIAGNOSIS — I350 Nonrheumatic aortic (valve) stenosis: Secondary | ICD-10-CM | POA: Diagnosis not present

## 2023-08-27 DIAGNOSIS — M1712 Unilateral primary osteoarthritis, left knee: Secondary | ICD-10-CM | POA: Diagnosis not present

## 2023-08-27 DIAGNOSIS — R7303 Prediabetes: Secondary | ICD-10-CM | POA: Diagnosis not present

## 2023-08-27 DIAGNOSIS — I10 Essential (primary) hypertension: Secondary | ICD-10-CM | POA: Diagnosis not present

## 2023-08-27 LAB — ECHOCARDIOGRAM COMPLETE
AR max vel: 1 cm2
AV Area VTI: 0.98 cm2
AV Area mean vel: 0.92 cm2
AV Mean grad: 24 mm[Hg]
AV Peak grad: 40.3 mm[Hg]
Ao pk vel: 3.18 m/s
Area-P 1/2: 3.77 cm2
MV M vel: 4.62 m/s
MV Peak grad: 85.4 mm[Hg]
P 1/2 time: 613 ms
S' Lateral: 3.2 cm

## 2023-08-31 ENCOUNTER — Encounter: Payer: Self-pay | Admitting: Neurology

## 2023-09-03 ENCOUNTER — Encounter: Payer: Self-pay | Admitting: Cardiovascular Disease

## 2023-09-10 ENCOUNTER — Telehealth: Payer: Self-pay

## 2023-09-10 DIAGNOSIS — I639 Cerebral infarction, unspecified: Secondary | ICD-10-CM

## 2023-09-10 DIAGNOSIS — I350 Nonrheumatic aortic (valve) stenosis: Secondary | ICD-10-CM

## 2023-09-10 NOTE — Telephone Encounter (Signed)
-----   Message from Cline Crock sent at 09/01/2023  3:14 PM EST ----- Spoke to pt and got him an apt with Dr. Excell Seltzer on 3/10 to establish care. He did tell me that his loop recorder got denied by insurance and something about needing 30 day ambulatory monitoring prior. Will send back to Dr. Graciela Husbands

## 2023-09-10 NOTE — Telephone Encounter (Signed)
 Spoke with pt and advised of need for 30 day monitor per insurance company for loop approval.  Pt verbalizes understanding and is agreeable with current plan.

## 2023-09-13 ENCOUNTER — Encounter: Payer: Self-pay | Admitting: Cardiovascular Disease

## 2023-09-13 ENCOUNTER — Ambulatory Visit: Payer: Medicare HMO | Attending: Cardiovascular Disease | Admitting: Cardiovascular Disease

## 2023-09-13 VITALS — BP 116/70 | HR 84 | Ht 65.0 in | Wt 147.4 lb

## 2023-09-13 DIAGNOSIS — I77819 Aortic ectasia, unspecified site: Secondary | ICD-10-CM | POA: Diagnosis not present

## 2023-09-13 DIAGNOSIS — Z01810 Encounter for preprocedural cardiovascular examination: Secondary | ICD-10-CM | POA: Diagnosis not present

## 2023-09-13 DIAGNOSIS — Q2381 Bicuspid aortic valve: Secondary | ICD-10-CM

## 2023-09-13 MED ORDER — METOPROLOL TARTRATE 100 MG PO TABS: ORAL_TABLET | ORAL | 0 refills | Status: DC

## 2023-09-13 NOTE — Progress Notes (Signed)
 Cardiology Office Note:    Date:  09/13/2023   ID:  Jerome Shaffer, DOB [birth year], MRN [id number]  PCP:  Wilfrid Lund, PA   [facility] Providers Cardiologist:  None     Referring MD: Wilfrid Lund, [place]   Chief Complaint  Patient presents with   Aortic Stenosis    History of Present Illness:    Jerome Shaffer is a 68 y.o. male referred by Dr. Graciela Husbands for evaluation of bicuspid aortic valve stenosis.  The patient had an echocardiogram August 27, 2023 that showed normal LVEF of 55 to 60% with a bicuspid aortic valve with fusion of the right and left cusps, moderate aortic stenosis with a mean gradient of 24 mmHg.  The ascending aorta was dilated at 40 mm.  The patient is here with his wife today. He was told that he has a heart murmur 2-3 years ago when he had an in-home insurance exam. He plays pickle ball regularly without exertional symptoms. He is planning on having knee replacement surgery.  He otherwise has no significant functional limitation.  He specifically denies chest pain, chest pressure, or shortness of breath.  No lightheadedness or syncope.  The patient's father died of an aortic aneurysm complication/rupture.  They do not know details of whether it was an abdominal or thoracic aneurysm.   Current Medications: Current Meds  Medication Sig   amLODipine (NORVASC) 5 MG tablet Take 5 mg by mouth daily.   amoxicillin (AMOXIL) 500 MG capsule Take 1,000 mg by mouth 2 (two) times daily. Prior to dental appt.   Ascorbic Acid (VITAMIN C) 1000 MG tablet Take 1,000 mg by mouth daily.   atorvastatin (LIPITOR) 10 MG tablet Take 10 mg by mouth daily.   Cholecalciferol (VITAMIN D3) 125 MCG (5000 UT) CAPS Take by mouth daily.   clopidogrel (PLAVIX) 75 MG tablet Take 1 tablet (75 mg total) by mouth daily.   Flaxseed, Linseed, (FLAXSEED OIL) 1000 MG CAPS Take 1,000 mg by mouth daily.   Ibuprofen-Diphenhydramine HCl (ADVIL PM) 200-25 MG CAPS Take 1-2 tablets by mouth  at bedtime as needed (for pain/sleep.).   metoprolol tartrate (LOPRESSOR) 100 MG tablet Take 1 tablet by mouth one hour prior to scan   Multiple Vitamin (MULTIVITAMIN WITH MINERALS) TABS tablet Take 1 tablet by mouth daily. Multivitamin for 50+   olmesartan (BENICAR) 5 MG tablet Take 5 mg by mouth daily.   tadalafil (CIALIS) 5 MG tablet Take 5 mg by mouth daily as needed.   tamsulosin (FLOMAX) 0.4 MG CAPS capsule Take 1 capsule by mouth daily.   [DISCONTINUED] VITAMIN D, CHOLECALCIFEROL, PO Take by mouth. daily     Allergies:   No known allergies   ROS:   Please see the history of present illness.    All other systems reviewed and are negative.  EKGs/Labs/Other Studies Reviewed:    The following studies were reviewed today: Cardiac Studies & Procedures   ______________________________________________________________________________________________     ECHOCARDIOGRAM  ECHOCARDIOGRAM COMPLETE 08/27/2023  Narrative ECHOCARDIOGRAM REPORT    Patient Name:   Jerome Shaffer Shiffman Date of Exam: 08/27/2023 Medical Rec #:  [id number]          Height:       65.0 in Accession #:    [id number]         Weight:       147.8 lb Date of Birth:  03-12-1956           BSA:  1.740 m Patient Age:    67 years           BP:           104/66 mmHg Patient Gender: M                  HR:           52 bpm. Exam Location:  [address]  Procedure: 2D Echo, Cardiac Doppler, Color Doppler, 3D Echo and Strain Analysis (Both Spectral and Color Flow Doppler were utilized during procedure).  Indications:    R01.1 Murmur; Aortic stenosis I35.0  History:        Patient has no prior history of Echocardiogram examinations. TIA and Stroke; Risk Factors:Hypertension.  Sonographer:    Thurman Coyer RDCS Referring Phys: [phone] [facility] Salvia  IMPRESSIONS   1. Functionally bicuspid aortic valve with fusion of right and left coronary cusps. 2. Left ventricular ejection fraction, by estimation, is 55 to  60%. The left ventricle has normal function. The left ventricle has no regional wall motion abnormalities. Left ventricular diastolic parameters are consistent with Grade II diastolic dysfunction (pseudonormalization). The average left ventricular global longitudinal strain is -17.7 %. The global longitudinal strain is normal. 3. Right ventricular systolic function is normal. The right ventricular size is normal. 4. The mitral valve is normal in structure. Mild mitral valve regurgitation. No evidence of mitral stenosis. 5. The aortic valve is calcified. Aortic valve regurgitation is mild. Moderate aortic valve stenosis. 6. Aortic dilatation noted. There is mild dilatation of the aortic root, measuring 40 mm. 7. The inferior vena cava is normal in size with greater than 50% respiratory variability, suggesting right atrial pressure of 3 mmHg.  Comparison(s): No prior Echocardiogram.  FINDINGS Left Ventricle: Left ventricular ejection fraction, by estimation, is 55 to 60%. The left ventricle has normal function. The left ventricle has no regional wall motion abnormalities. The average left ventricular global longitudinal strain is -17.7 %. Strain was performed and the global longitudinal strain is normal. The left ventricular internal cavity size was normal in size. There is no left ventricular hypertrophy. Left ventricular diastolic parameters are consistent with Grade II diastolic dysfunction (pseudonormalization).  Right Ventricle: The right ventricular size is normal. Right ventricular systolic function is normal.  Left Atrium: Left atrial size was normal in size.  Right Atrium: Right atrial size was normal in size.  Pericardium: There is no evidence of pericardial effusion.  Mitral Valve: The mitral valve is normal in structure. Mild mitral valve regurgitation. No evidence of mitral valve stenosis.  Tricuspid Valve: The tricuspid valve is normal in structure. Tricuspid valve regurgitation  is mild . No evidence of tricuspid stenosis.  Aortic Valve: The aortic valve is calcified. Aortic valve regurgitation is mild. Aortic regurgitation PHT measures 613 msec. Moderate aortic stenosis is present. Aortic valve mean gradient measures 24.0 mmHg. Aortic valve peak gradient measures 40.3 mmHg. Aortic valve area, by VTI measures 0.98 cm.  Pulmonic Valve: The pulmonic valve was normal in structure. Pulmonic valve regurgitation is trivial. No evidence of pulmonic stenosis.  Aorta: Aortic dilatation noted. There is mild dilatation of the aortic root, measuring 40 mm.  Venous: The inferior vena cava is normal in size with greater than 50% respiratory variability, suggesting right atrial pressure of 3 mmHg.  IAS/Shunts: No atrial level shunt detected by color flow Doppler.  Additional Comments: Functionally bicuspid aortic valve with fusion of right and left coronary cusps. 3D was performed not requiring image post processing  on an independent workstation and was normal.   LEFT VENTRICLE PLAX 2D LVIDd:         4.90 cm   Diastology LVIDs:         3.20 cm   LV e' medial:    5.26 cm/s LV PW:         0.80 cm   LV E/e' medial:  12.3 LV IVS:        1.00 cm   LV e' lateral:   6.86 cm/s LVOT diam:     2.30 cm   LV E/e' lateral: 9.4 LV SV:         81 LV SV Index:   46        2D Longitudinal Strain LVOT Area:     4.15 cm  2D Strain GLS Avg:     -17.7 %  3D Volume EF: 3D EF:        59 % LV EDV:       169 ml LV ESV:       69 ml LV SV:        100 ml  RIGHT VENTRICLE RV Basal diam:  4.10 cm RV Mid diam:    3.00 cm RV S prime:     8.92 cm/s TAPSE (M-mode): 3.1 cm  LEFT ATRIUM             Index        RIGHT ATRIUM           Index LA diam:        3.50 cm 2.01 cm/m   RA Area:     18.80 cm LA Vol (A2C):   69.6 ml 40.01 ml/m  RA Volume:   51.10 ml  29.38 ml/m LA Vol (A4C):   36.7 ml 21.10 ml/m LA Biplane Vol: 48.5 ml 27.88 ml/m AORTIC VALVE AV Area (Vmax):    1.00 cm AV Area  (Vmean):   0.92 cm AV Area (VTI):     0.98 cm AV Vmax:           317.50 cm/s AV Vmean:          232.500 cm/s AV VTI:            0.824 m AV Peak Grad:      40.3 mmHg AV Mean Grad:      24.0 mmHg LVOT Vmax:         76.40 cm/s LVOT Vmean:        51.700 cm/s LVOT VTI:          0.194 m LVOT/AV VTI ratio: 0.24 AI PHT:            613 msec  AORTA Ao Root diam: 4.00 cm Ao Asc diam:  3.50 cm  MITRAL VALVE MV Area (PHT): 3.77 cm    SHUNTS MV Decel Time: 201 msec    Systemic VTI:  0.19 m MR Peak grad: 85.4 mmHg    Systemic Diam: 2.30 cm MR Mean grad: 62.0 mmHg MR Vmax:      462.00 cm/s MR Vmean:     380.0 cm/s MV E velocity: 64.50 cm/s MV A velocity: 49.80 cm/s MV E/A ratio:  1.30  Olga Millers MD Electronically signed by Olga Millers MD Signature Date/Time: 08/27/2023/11:55:47 AM    Final          ______________________________________________________________________________________________      EKG:        Recent Labs: No results found for requested labs within last 365  days.  Recent Lipid Panel No results found for: "CHOL", "TRIG", "HDL", "CHOLHDL", "VLDL", "LDLCALC", "LDLDIRECT"   Risk Assessment/Calculations:                Physical Exam:    VS:  BP 116/70   Pulse 84   Ht 5\' 5"  (1.651 m)   Wt 147 lb 6.4 oz (66.9 kg)   SpO2 97%   BMI 24.53 kg/m     Wt Readings from Last 3 Encounters:  09/13/23 147 lb 6.4 oz (66.9 kg)  08/10/23 147 lb 12.8 oz (67 kg)  06/15/23 142 lb (64.4 kg)     GEN:  Well nourished, well developed in no acute distress HEENT: Normal NECK: No JVD; No carotid bruits LYMPHATICS: No lymphadenopathy CARDIAC: RRR, 2/6 harsh mid peaking systolic murmur at the right upper sternal border RESPIRATORY:  Clear to auscultation without rales, wheezing or rhonchi  ABDOMEN: Soft, non-tender, non-distended MUSCULOSKELETAL:  No edema; No deformity  SKIN: Warm and dry NEUROLOGIC:  Alert and oriented x 3 PSYCHIATRIC:  Normal affect    Assessment & Plan Bicuspid aortic valve The patient appears to have moderate aortic stenosis.  Over the last 2 years his mean gradient has increased from 11 up to 16 and now at 24 mmHg.  We discussed the natural history of aortic stenosis, cardinal symptoms, and the need for ongoing surveillance.  I recommended a repeat echo study next year prior to his office visit.  He currently has no symptoms of aortic stenosis and understands that we will keep an eye on this.  He also has mild aortic insufficiency.  We discussed recommendations for screening of his children and grandchildren. Pre-procedural cardiovascular examination Check metabolic panel before CTA (see below) Aortic dilatation (HCC) The patient's ascending aorta is 4 cm by echo, but this does not give adequate imaging and a bicuspid aortic valve patient.  I have recommended a gated CTA of the chest to better visualize the patient's aorta and provide accurate measurements for potential aortic aneurysm.            Medication Adjustments/Labs and Tests Ordered: Current medicines are reviewed at length with the patient today.  Concerns regarding medicines are outlined above.  Orders Placed This Encounter  Procedures   CT ANGIO CHEST AORTA W/ & OR WO/CM & GATING ([place] ONLY)   Basic metabolic panel   ECHOCARDIOGRAM COMPLETE   Meds ordered this encounter  Medications   metoprolol tartrate (LOPRESSOR) 100 MG tablet    Sig: Take 1 tablet by mouth one hour prior to scan    Dispense:  1 tablet    Refill:  0    Patient Instructions  Lab Work: BMET If you have labs (blood work) drawn today and your tests are completely normal, you will receive your results only by: [employer] (if you have MyChart) OR A paper copy in the mail If you have any lab test that is abnormal or we need to change your treatment, we will call you to review the results.  Testing/Procedures: ECHO (in 1 year, prior to appt) Your physician has  requested that you have an echocardiogram. Echocardiography is a painless test that uses sound waves to create images of your heart. It provides your doctor with information about the size and shape of your heart and how well your heart's chambers and valves are working. This procedure takes approximately one hour. There are no restrictions for this procedure. Please do NOT wear cologne, perfume, aftershave, or lotions (  deodorant is allowed). Please arrive 15 minutes prior to your appointment time.  Please note: We ask at that you not bring children with you during ultrasound (echo/ vascular) testing. Due to room size and safety concerns, children are not allowed in the ultrasound rooms during exams. Our front office staff cannot provide observation of children in our lobby area while testing is being conducted. An adult accompanying a patient to their appointment will only be allowed in the ultrasound room at the discretion of the ultrasound technician under special circumstances. We apologize for any inconvenience.  Coronary CT Angiogram Non-Cardiac CT Angiography (CTA), is a special type of CT scan that uses a computer to produce multi-dimensional views of major blood vessels throughout the body. In CT angiography, a contrast material is injected through an IV to help visualize the blood vessels  Follow-Up: At [facility], you and your health needs are our priority.  As part of our continuing mission to provide you with exceptional heart care, we have created designated Provider Care Teams.  These Care Teams include your primary Cardiologist (physician) and Advanced Practice Providers (APPs -  Physician Assistants and Nurse Practitioners) who all work together to provide you with the care you need, when you need it.  Your next appointment:   1 year(s)  Provider:   Tonny Bollman, MD   Your cardiac CT will be scheduled at:   [facility] [address] [place],  [place] [address] [phone]  please arrive at the [facility] and Children's Entrance (Entrance C2) of [facility] 30 minutes prior to test start time. You can use the FREE valet parking offered at entrance C (encouraged to control the heart rate for the test)  Proceed to the [facility] Radiology Department (first floor) to check-in and test prep.  All radiology patients and guests should use entrance C2 at [facility], accessed from [facility], even though the hospital's physical address listed is [address].    Please follow these instructions carefully (unless otherwise directed):  An IV will be required for this test and Nitroglycerin will be given.  Hold all erectile dysfunction medications at least 3 days (72 hrs) prior to test. (Ie viagra, cialis, sildenafil, tadalafil, etc)   On the Night Before the Test: Be sure to Drink plenty of water. Do not consume any caffeinated/decaffeinated beverages or chocolate 12 hours prior to your test. Do not take any antihistamines 12 hours prior to your test.  On the Day of the Test: Drink plenty of water until 1 hour prior to the test. Do not eat any food 1 hour prior to test. You may take your regular medications prior to the test.  Take metoprolol (Lopressor) two hours prior to test. If you take Furosemide/Hydrochlorothiazide/Spironolactone/Chlorthalidone, please HOLD on the morning of the test. Patients who wear a continuous glucose monitor MUST remove the device prior to scanning. After the Test: Drink plenty of water. After receiving IV contrast, you may experience a mild flushed feeling. This is normal. On occasion, you may experience a mild rash up to 24 hours after the test. This is not dangerous. If this occurs, you can take Benadryl 25 mg, Zyrtec, Claritin, or Allegra and increase your fluid intake. (Patients taking Tikosyn should avoid Benadryl, and may take Zyrtec, Claritin, or Allegra) If you  experience trouble breathing, this can be serious. If it is severe call 911 IMMEDIATELY. If it is mild, please call our office.  We will  call to schedule your test 2-4 weeks out understanding that some insurance companies will need an authorization prior to the service being performed.   For more information and frequently asked questions, please visit our website : [email or web address]  For non-scheduling related questions, please contact the cardiac imaging nurse navigator should you have any questions/concerns: Cardiac Imaging Nurse Navigators Direct Office Dial: [phone]   For scheduling needs, including cancellations and rescheduling, please call [place], [phone].    1st Floor: - Lobby - Registration  - Pharmacy  - Lab - Cafe  2nd Floor: - PV Lab - Diagnostic Testing (echo, CT, nuclear med)  3rd Floor: - Vacant  4th Floor: - TCTS (cardiothoracic surgery) - AFib Clinic - Structural Heart Clinic - Vascular Surgery  - Vascular Ultrasound  5th Floor: - HeartCare Cardiology (general and EP) - Clinical Pharmacy for coumadin, hypertension, lipid, weight-loss medications, and med management appointments    Valet parking services will be available as well.     Signed, Tonny Bollman, MD  09/13/2023 5:00 PM    [facility]

## 2023-09-13 NOTE — Patient Instructions (Addendum)
 Lab Work: Nutritional therapist If you have labs (blood work) drawn today and your tests are completely normal, you will receive your results only by: [employer] (if you have MyChart) OR A paper copy in the mail If you have any lab test that is abnormal or we need to change your treatment, we will call you to review the results.  Testing/Procedures: ECHO (in 1 year, prior to appt) Your physician has requested that you have an echocardiogram. Echocardiography is a painless test that uses sound waves to create images of your heart. It provides your doctor with information about the size and shape of your heart and how well your heart's chambers and valves are working. This procedure takes approximately one hour. There are no restrictions for this procedure. Please do NOT wear cologne, perfume, aftershave, or lotions (deodorant is allowed). Please arrive 15 minutes prior to your appointment time.  Please note: We ask at that you not bring children with you during ultrasound (echo/ vascular) testing. Due to room size and safety concerns, children are not allowed in the ultrasound rooms during exams. Our front office staff cannot provide observation of children in our lobby area while testing is being conducted. An adult accompanying a patient to their appointment will only be allowed in the ultrasound room at the discretion of the ultrasound technician under special circumstances. We apologize for any inconvenience.  Coronary CT Angiogram Non-Cardiac CT Angiography (CTA), is a special type of CT scan that uses a computer to produce multi-dimensional views of major blood vessels throughout the body. In CT angiography, a contrast material is injected through an IV to help visualize the blood vessels  Follow-Up: At [facility], you and your health needs are our priority.  As part of our continuing mission to provide you with exceptional heart care, we have created designated Provider Care Teams.  These  Care Teams include your primary Cardiologist (physician) and Advanced Practice Providers (APPs -  Physician Assistants and Nurse Practitioners) who all work together to provide you with the care you need, when you need it.  Your next appointment:   1 year(s)  Provider:   Tonny Bollman, MD   Your cardiac CT will be scheduled at:   [facility] [address] [place], [place] [address] [phone]  please arrive at the [facility] and Children's Entrance (Entrance C2) of [employer] 30 minutes prior to test start time. You can use the FREE valet parking offered at entrance C (encouraged to control the heart rate for the test)  Proceed to the [facility] Radiology Department (first floor) to check-in and test prep.  All radiology patients and guests should use entrance C2 at [facility], accessed from [facility], even though the hospital's physical address listed is [address].    Please follow these instructions carefully (unless otherwise directed):  An IV will be required for this test and Nitroglycerin will be given.  Hold all erectile dysfunction medications at least 3 days (72 hrs) prior to test. (Ie viagra, cialis, sildenafil, tadalafil, etc)   On the Night Before the Test: Be sure to Drink plenty of water. Do not consume any caffeinated/decaffeinated beverages or chocolate 12 hours prior to your test. Do not take any antihistamines 12 hours prior to your test.  On the Day of the Test: Drink plenty of water until 1 hour prior to the test. Do not eat any food 1 hour prior to test. You may take your  regular medications prior to the test.  Take metoprolol (Lopressor) two hours prior to test. If you take Furosemide/Hydrochlorothiazide/Spironolactone/Chlorthalidone, please HOLD on the morning of the test. Patients who wear a continuous glucose monitor MUST remove the device prior to scanning. After the Test: Drink plenty of  water. After receiving IV contrast, you may experience a mild flushed feeling. This is normal. On occasion, you may experience a mild rash up to 24 hours after the test. This is not dangerous. If this occurs, you can take Benadryl 25 mg, Zyrtec, Claritin, or Allegra and increase your fluid intake. (Patients taking Tikosyn should avoid Benadryl, and may take Zyrtec, Claritin, or Allegra) If you experience trouble breathing, this can be serious. If it is severe call 911 IMMEDIATELY. If it is mild, please call our office.  We will call to schedule your test 2-4 weeks out understanding that some insurance companies will need an authorization prior to the service being performed.   For more information and frequently asked questions, please visit our website : [email or web address]  For non-scheduling related questions, please contact the cardiac imaging nurse navigator should you have any questions/concerns: Cardiac Imaging Nurse Navigators Direct Office Dial: [phone]   For scheduling needs, including cancellations and rescheduling, please call [place], [phone].    1st Floor: - Lobby - Registration  - Pharmacy  - Lab - Cafe  2nd Floor: - PV Lab - Diagnostic Testing (echo, CT, nuclear med)  3rd Floor: - Vacant  4th Floor: - TCTS (cardiothoracic surgery) - AFib Clinic - Structural Heart Clinic - Vascular Surgery  - Vascular Ultrasound  5th Floor: - HeartCare Cardiology (general and EP) - Clinical Pharmacy for coumadin, hypertension, lipid, weight-loss medications, and med management appointments    Valet parking services will be available as well.

## 2023-09-16 ENCOUNTER — Encounter: Payer: Self-pay | Admitting: Cardiovascular Disease

## 2023-09-17 DIAGNOSIS — I639 Cerebral infarction, unspecified: Secondary | ICD-10-CM | POA: Diagnosis not present

## 2023-09-24 ENCOUNTER — Telehealth: Payer: Self-pay | Admitting: Cardiovascular Disease

## 2023-09-24 DIAGNOSIS — Z01810 Encounter for preprocedural cardiovascular examination: Secondary | ICD-10-CM | POA: Diagnosis not present

## 2023-09-24 DIAGNOSIS — Q2381 Bicuspid aortic valve: Secondary | ICD-10-CM | POA: Diagnosis not present

## 2023-09-24 DIAGNOSIS — I77819 Aortic ectasia, unspecified site: Secondary | ICD-10-CM | POA: Diagnosis not present

## 2023-09-24 NOTE — Telephone Encounter (Signed)
 Spoke with patient and he is aware his insurance approved CT

## 2023-09-24 NOTE — Telephone Encounter (Signed)
 Patient calling with question about his CT procedure. And wanted to make sure his insurance approve the procedure. Please advise

## 2023-09-25 LAB — BASIC METABOLIC PANEL
BUN/Creatinine Ratio: 20 (ref 10–24)
BUN: 20 mg/dL (ref 8–27)
CO2: 25 mmol/L (ref 20–29)
Calcium: 10.1 mg/dL (ref 8.6–10.2)
Chloride: 106 mmol/L (ref 96–106)
Creatinine, Ser: 0.99 mg/dL (ref 0.76–1.27)
Glucose: 113 mg/dL — ABNORMAL HIGH (ref 70–99)
Potassium: 4.8 mmol/L (ref 3.5–5.2)
Sodium: 144 mmol/L (ref 134–144)
eGFR: 83 mL/min/{1.73_m2} (ref >59)

## 2023-09-29 ENCOUNTER — Encounter: Payer: Self-pay | Admitting: Cardiovascular Disease

## 2023-09-29 DIAGNOSIS — M170 Bilateral primary osteoarthritis of knee: Secondary | ICD-10-CM | POA: Diagnosis not present

## 2023-09-30 ENCOUNTER — Ambulatory Visit (HOSPITAL_COMMUNITY)
Admission: RE | Admit: 2023-09-30 | Discharge: 2023-09-30 | Disposition: A | Discharge status: Home / Self Care | Source: Ambulatory Visit | Attending: Cardiovascular Disease | Admitting: Cardiovascular Disease

## 2023-09-30 DIAGNOSIS — Q2381 Bicuspid aortic valve: Secondary | ICD-10-CM | POA: Diagnosis not present

## 2023-09-30 DIAGNOSIS — Z01810 Encounter for preprocedural cardiovascular examination: Secondary | ICD-10-CM | POA: Insufficient documentation

## 2023-09-30 DIAGNOSIS — I77819 Aortic ectasia, unspecified site: Secondary | ICD-10-CM | POA: Diagnosis not present

## 2023-09-30 DIAGNOSIS — I712 Thoracic aortic aneurysm, without rupture, unspecified: Secondary | ICD-10-CM | POA: Diagnosis not present

## 2023-09-30 DIAGNOSIS — I700 Atherosclerosis of aorta: Secondary | ICD-10-CM | POA: Diagnosis not present

## 2023-09-30 MED ORDER — IOHEXOL 350 MG/ML SOLN
75.0000 mL | Freq: Once | INTRAVENOUS | Status: AC | PRN
  Administered 2023-09-30: 75 mL via INTRAVENOUS

## 2023-10-07 ENCOUNTER — Encounter: Payer: Self-pay | Admitting: Cardiovascular Disease

## 2023-10-19 ENCOUNTER — Ambulatory Visit: Attending: Internal Medicine

## 2023-10-19 DIAGNOSIS — I350 Nonrheumatic aortic (valve) stenosis: Secondary | ICD-10-CM

## 2023-10-19 DIAGNOSIS — I639 Cerebral infarction, unspecified: Secondary | ICD-10-CM

## 2023-10-28 DIAGNOSIS — I639 Cerebral infarction, unspecified: Secondary | ICD-10-CM | POA: Diagnosis not present

## 2023-10-28 DIAGNOSIS — I350 Nonrheumatic aortic (valve) stenosis: Secondary | ICD-10-CM

## 2023-12-02 NOTE — Progress Notes (Unsigned)
 Electrophysiology Office Follow up Visit Note:    Date:  12/03/2023   ID:  Jerome Shaffer, DOB March 16, 1956, MRN [id number]  PCP:  Sinda Duel, PA  [facility] Cardiologist:  None  [facility] Electrophysiologist:  Jerome Byes, MD    Interval History:     Jerome Shaffer is a 68 y.o. male who presents for a follow up visit.   The patient was last seen by Albertine Hugh on August 10, 2023.  He has a history of cryptogenic stroke, hypertension.  Loop recorder implant was planned but never accomplished.  He presents today for loop recorder implant.  He is doing well today.  He is interested in proceeding with loop recorder implant.        Past medical, surgical, social and family history were reviewed.  ROS:   Please see the history of present illness.    All other systems reviewed and are negative.  EKGs/Labs/Other Studies Reviewed:    The following studies were reviewed today:  October 28, 2023 ZIO monitor Rare PACs Occasional PVCs, 4% No atrial fibrillation  August 27, 2023 echo EF 55 to 60% Functionally bicuspid aortic valve RV normal Mild MR Moderate aortic stenosis       Physical Exam:    VS:  BP 116/74   Pulse 67   Ht 5' 5.5" (1.664 m)   Wt 140 lb (63.5 kg)   SpO2 97%   BMI 22.94 kg/m     Wt Readings from Last 3 Encounters:  12/03/23 140 lb (63.5 kg)  09/13/23 147 lb 6.4 oz (66.9 kg)  08/10/23 147 lb 12.8 oz (67 kg)     GEN: no distress CARD: RRR, No MRG RESP: No IWOB. CTAB.      ASSESSMENT:    1. Cryptogenic stroke (HCC)    PLAN:    In order of problems listed above:  #Cryptogenic Stroke Pathophysiology of cryptogenic stroke was discussed in detail during today's clinic appointment. I discussed the role of loop recorder monitoring in patients who have suffered a CVA/TIA. There has been no evidence of AF thus far in the patient's evaluation. Loop recorder monitors were discussed in detail including the implant  procedure and its risks. I discussed the monthly monitoring costs associated with loop recorder monitoring. The patient would like to proceed with ILR implant.    Signed, Jerome Liner, MD, [facility], [facility] 12/03/2023 8:21 AM    Electrophysiology [facility]  ------------------  SURGEON:  Jerome Byes, MD     PREPROCEDURE DIAGNOSIS:  Cryptogenic stroke    POSTPROCEDURE DIAGNOSIS: Cryptogenic stroke     PROCEDURES:   1. Implantable loop recorder implantation    INTRODUCTION:  Jerome Shaffer presents with a history of cryptogenic stroke The costs of loop recorder monitoring have been discussed with the patient.    DESCRIPTION OF PROCEDURE:  Informed written consent was obtained.  A preprocedural timeout was performed with the RN Luther Saltness). The patient required no sedation for the procedure today.  Mapping over the patient's chest was performed to identify the area where electrograms were most prominent for ILR recording.  This area was found to be the left parasternal region over the 4th intercostal space. The patients left chest was therefore prepped and draped in the usual sterile fashion. The skin overlying the left parasternal region was infiltrated with lidocaine  for local analgesia.  A 0.5-cm incision was made over the left parasternal region over the 3rd intercostal space.  A  subcutaneous ILR pocket was fashioned using a combination of sharp and blunt dissection.  A Medtronic Reveal LINQ 2 implantable loop recorder was then placed into the pocket  R waves were very prominent and measured >0.56mV.  Steri- Strips and a sterile dressing were then applied.  There were no early apparent complications.     CONCLUSIONS:   1. Successful implantation of a implantable loop recorder for Cryptogenic stroke  2. No early apparent complications.   Jerome Fujita T. Marven Slimmer, MD, [facility], [facility] Cardiac Electrophysiology

## 2023-12-03 ENCOUNTER — Encounter: Payer: Self-pay | Admitting: Cardiology

## 2023-12-03 ENCOUNTER — Ambulatory Visit: Attending: Cardiology | Admitting: Cardiology

## 2023-12-03 VITALS — BP 116/74 | HR 67 | Ht 65.5 in | Wt 140.0 lb

## 2023-12-03 DIAGNOSIS — M170 Bilateral primary osteoarthritis of knee: Secondary | ICD-10-CM | POA: Diagnosis not present

## 2023-12-03 DIAGNOSIS — I639 Cerebral infarction, unspecified: Secondary | ICD-10-CM

## 2023-12-03 NOTE — Patient Instructions (Signed)
Medication Instructions:  Your physician recommends that you continue on your current medications as directed. Please refer to the Current Medication list given to you today.  Labwork: None ordered.  Testing/Procedures: None ordered.  Follow-Up:  As needed with Dr. Lalla Brothers  Implantable Loop Recorder Placement, Care After This sheet gives you information about how to care for yourself after your procedure. Your health care provider may also give you more specific instructions. If you have problems or questions, contact your health care provider. What can I expect after the procedure? After the procedure, it is common to have: Soreness or discomfort near the incision. Some swelling or bruising near the incision.  Follow these instructions at home: Incision care  Monitor your cardiac device site for redness, swelling, and drainage. Call the device clinic at [phone] if you experience these symptoms or fever/chills.  Keep the large square bandage on your site for 24 hours and then you may remove it yourself. Keep the steri-strips underneath in place.   You may shower after 72 hours / 3 days from your procedure with the steri-strips in place. They will usually fall off on their own, or may be removed after 10 days. Pat dry.   Avoid lotions, ointments, or perfumes over your incision until it is well-healed.  Please do not submerge in water until your site is completely healed.   Your device is MRI compatible.   Remote monitoring is used to monitor your cardiac device from home. This monitoring is scheduled every month by our office. It allows [place] to keep an eye on the function of your device to ensure it is working properly.  If your wound site starts to bleed apply pressure.    For help with the monitor please call [employer] Monitor Support Specialist directly at [phone].    If you have any questions/concerns please call the device clinic at  [phone].  Activity  Return to your normal activities.  General instructions Follow instructions from your health care provider about how to manage your implantable loop recorder and transmit the information. Learn how to activate a recording if this is necessary for your type of device. You may go through a metal detection gate, and you may let someone hold a metal detector over your chest. Show your ID card if needed. Do not have an MRI unless you check with your health care provider first. Take over-the-counter and prescription medicines only as told by your health care provider. Keep all follow-up visits as told by your health care provider. This is important. Contact a health care provider if: You have redness, swelling, or pain around your incision. You have a fever. You have pain that is not relieved by your pain medicine. You have triggered your device because of fainting (syncope) or because of a heartbeat that feels like it is racing, slow, fluttering, or skipping (palpitations). Get help right away if you have: Chest pain. Difficulty breathing. Summary After the procedure, it is common to have soreness or discomfort near the incision. Change your dressing as told by your health care provider. Follow instructions from your health care provider about how to manage your implantable loop recorder and transmit the information. Keep all follow-up visits as told by your health care provider. This is important. This information is not intended to replace advice given to you by your health care provider. Make sure you discuss any questions you have with your health care provider. Document Released: 06/03/2015 Document Revised: 08/07/2017 Document Reviewed: 08/07/2017 Elsevier Patient  Education  2020 [employer] Inc[employer]  

## 2023-12-06 ENCOUNTER — Telehealth: Payer: Self-pay | Admitting: Neurology

## 2023-12-06 ENCOUNTER — Telehealth: Payer: Self-pay | Admitting: Cardiology

## 2023-12-06 NOTE — Telephone Encounter (Signed)
 Received a surgical clearance form to be completed for the pt for upcoming procedure scheduled in sept. Pt states he has not had any new stroke like symptoms and last stroke was found on MRI imaging in 05/2023. Pt is at low but acceptable risk of repeat stroke and may proceed with holding plavix  5 days prior to the procedure. Dr Janett Medin will sign the form upon return to work next week and then we will fax to provider Dr Genevive Ket for the pt. Pt verbalized understanding.

## 2023-12-06 NOTE — Telephone Encounter (Signed)
 Spoke with pt over the phone. Pt stated that he had been showering for the first time since he loop recorder placement Friday and the steri strips had both come off accidentally when he got in the shower. Advised pt to monitor for bleeding, opening of site, and general signs of infection such as redness, fever, etc and was told to get evaluated if any of these occurred. Pt verbalized understanding of plan and had no further questions at this time.

## 2023-12-06 NOTE — Telephone Encounter (Signed)
 Patient stated he was going to take his first shower today after loop recorder placement last Friday and he removed the steri-strip along with his bandage.  Patient noted he has applied a gauze bandange but wants advice on next steps.

## 2023-12-08 ENCOUNTER — Telehealth: Payer: Self-pay | Admitting: Cardiology

## 2023-12-08 DIAGNOSIS — L578 Other skin changes due to chronic exposure to nonionizing radiation: Secondary | ICD-10-CM | POA: Diagnosis not present

## 2023-12-08 DIAGNOSIS — L918 Other hypertrophic disorders of the skin: Secondary | ICD-10-CM | POA: Diagnosis not present

## 2023-12-08 DIAGNOSIS — L821 Other seborrheic keratosis: Secondary | ICD-10-CM | POA: Diagnosis not present

## 2023-12-08 DIAGNOSIS — D229 Melanocytic nevi, unspecified: Secondary | ICD-10-CM | POA: Diagnosis not present

## 2023-12-08 DIAGNOSIS — D1801 Hemangioma of skin and subcutaneous tissue: Secondary | ICD-10-CM | POA: Diagnosis not present

## 2023-12-08 DIAGNOSIS — L814 Other melanin hyperpigmentation: Secondary | ICD-10-CM | POA: Diagnosis not present

## 2023-12-08 NOTE — Telephone Encounter (Signed)
 Paper Work Dropped Off:  AT front desk   Date:12/06/2023  Location of paper:  In [employer]

## 2023-12-09 ENCOUNTER — Telehealth: Payer: Self-pay

## 2023-12-09 NOTE — Telephone Encounter (Signed)
 med rec and consent done     Patient Consent for Virtual Visit        Jerome Shaffer has provided verbal consent on 12/09/2023 for a virtual visit (video or telephone).   CONSENT FOR VIRTUAL VISIT FOR:  Jerome Shaffer  By participating in this virtual visit I agree to the following:  I hereby voluntarily request, consent and authorize [facility] and its employed or contracted physicians, physician assistants, nurse practitioners or other licensed health care professionals (the Practitioner), to provide me with telemedicine health care services (the "Services") as deemed necessary by the treating Practitioner. I acknowledge and consent to receive the Services by the Practitioner via telemedicine. I understand that the telemedicine visit will involve communicating with the Practitioner through live audiovisual communication technology and the disclosure of certain medical information by electronic transmission. I acknowledge that I have been given the opportunity to request an in-person assessment or other available alternative prior to the telemedicine visit and am voluntarily participating in the telemedicine visit.  I understand that I have the right to withhold or withdraw my consent to the use of telemedicine in the course of my care at any time, without affecting my right to future care or treatment, and that the Practitioner or I may terminate the telemedicine visit at any time. I understand that I have the right to inspect all information obtained and/or recorded in the course of the telemedicine visit and may receive copies of available information for a reasonable fee.  I understand that some of the potential risks of receiving the Services via telemedicine include:  Delay or interruption in medical evaluation due to technological equipment failure or disruption; Information transmitted may not be sufficient (e.g. poor resolution of images) to allow for appropriate medical  decision making by the Practitioner; and/or  In rare instances, security protocols could fail, causing a breach of personal health information.  Furthermore, I acknowledge that it is my responsibility to provide information about my medical history, conditions and care that is complete and accurate to the best of my ability. I acknowledge that Practitioner's advice, recommendations, and/or decision may be based on factors not within their control, such as incomplete or inaccurate data provided by me or distortions of diagnostic images or specimens that may result from electronic transmissions. I understand that the practice of medicine is not an exact science and that Practitioner makes no warranties or guarantees regarding treatment outcomes. I acknowledge that a copy of this consent can be made available to me via my patient portal [facility] MyChart), or I can request a printed copy by calling the office of [facility].    I understand that my insurance will be billed for this visit.   I have read or had this consent read to me. I understand the contents of this consent, which adequately explains the benefits and risks of the Services being provided via telemedicine.  I have been provided ample opportunity to ask questions regarding this consent and the Services and have had my questions answered to my satisfaction. I give my informed consent for the services to be provided through the use of telemedicine in my medical care

## 2023-12-09 NOTE — Telephone Encounter (Signed)
 Spoke with patient. Patient stated that the Left Knee procedure is 01/31/24 and the Right Knee procedure is 03/13/24.  Spoke with Bartholomew Light from [facility] and confirmed the dates.  Going to update Preop Team and confirm that the patient is still good for phone appointment.

## 2023-12-09 NOTE — Telephone Encounter (Signed)
 Will route to Ardella Beaver, MD (Neurology)   Please give recommendation for holding Plavix  before Left and Right Knee Procedures.

## 2023-12-09 NOTE — Telephone Encounter (Signed)
...     Pre-operative Risk Assessment    Patient Name: Jerome Shaffer  DOB: 24-Sep-1955 MRN: [id number]   Date of last office visit: 12/03/23 LAMBERT/10/28/23 KLEIN/09/13/23 COOPER Date of next office visit: NONE   Request for Surgical Clearance    Procedure:  RIGHT TKA  Date of Surgery:  Clearance 03/13/24                                Surgeon:  DR Dorthy Gavia Surgeon's Group or Practice Name:  [facility] Phone number:  [phone] [facility] [place]) Fax number:  [phone]   Type of Clearance Requested:   - Medical  - Pharmacy:  Hold Clopidogrel  (Plavix )     Type of Anesthesia:  Spinal   Additional requests/questions:  PATIENT NEED A CBC, BMET, HGB A1C AND IF NEEDED A UA, PT/INR, CXR AND EKG  Signed, Azell Leopard   12/09/2023, 7:44 AM

## 2023-12-09 NOTE — Telephone Encounter (Signed)
   Name: RENEE BEALE  DOB: Aug 25, 1955  MRN: [id number]  Primary Cardiologist: None   Preoperative team, please contact this patient and set up a phone call appointment for further preoperative risk assessment. Please obtain consent and complete medication review. Thank you for your help.  Please schedule closer to procedure date on 03/13/2024.  I confirm that guidance regarding antiplatelet and oral anticoagulation therapy has been completed and, if necessary, noted below.  Plavix  is managed by neurology.  Please reach out to neurology for recommendations on hold.  I also confirmed the patient resides in the state of [place] . As per [facility] Medical Board telemedicine laws, the patient must reside in the state in which the provider is licensed.   Ava Boatman, NP 12/09/2023, 12:52 PM [facility]

## 2023-12-09 NOTE — Telephone Encounter (Signed)
 Form is for pre-op clearance and has been give to the pre-op team. See above phone note.

## 2023-12-09 NOTE — Telephone Encounter (Signed)
 S/W pt and scheduled TELE Preop appt. 01/14/24. med rec and consent done

## 2023-12-24 DIAGNOSIS — Z0000 Encounter for general adult medical examination without abnormal findings: Secondary | ICD-10-CM | POA: Diagnosis not present

## 2024-01-03 DIAGNOSIS — E785 Hyperlipidemia, unspecified: Secondary | ICD-10-CM | POA: Diagnosis not present

## 2024-01-03 DIAGNOSIS — I10 Essential (primary) hypertension: Secondary | ICD-10-CM | POA: Diagnosis not present

## 2024-01-03 DIAGNOSIS — M1990 Unspecified osteoarthritis, unspecified site: Secondary | ICD-10-CM | POA: Diagnosis not present

## 2024-01-03 DIAGNOSIS — R7303 Prediabetes: Secondary | ICD-10-CM | POA: Diagnosis not present

## 2024-01-03 DIAGNOSIS — Z01818 Encounter for other preprocedural examination: Secondary | ICD-10-CM | POA: Diagnosis not present

## 2024-01-04 ENCOUNTER — Ambulatory Visit

## 2024-01-04 DIAGNOSIS — I639 Cerebral infarction, unspecified: Secondary | ICD-10-CM

## 2024-01-04 LAB — CUP PACEART REMOTE DEVICE CHECK
Date Time Interrogation Session: 20250701043909
Implantable Pulse Generator Implant Date: 20250530

## 2024-01-05 ENCOUNTER — Ambulatory Visit: Payer: Self-pay | Admitting: Cardiology

## 2024-01-11 ENCOUNTER — Ambulatory Visit: Admitting: Neurology

## 2024-01-11 ENCOUNTER — Encounter: Payer: Self-pay | Admitting: Neurology

## 2024-01-11 VITALS — BP 109/72 | HR 87 | Ht 65.0 in | Wt 139.0 lb

## 2024-01-11 DIAGNOSIS — R202 Paresthesia of skin: Secondary | ICD-10-CM

## 2024-01-11 DIAGNOSIS — Z8673 Personal history of transient ischemic attack (TIA), and cerebral infarction without residual deficits: Secondary | ICD-10-CM

## 2024-01-11 DIAGNOSIS — E7849 Other hyperlipidemia: Secondary | ICD-10-CM | POA: Diagnosis not present

## 2024-01-11 NOTE — Progress Notes (Signed)
 [facility] [address] [place]. [place] [address] [phone]       OFFICE FOLLOW-UP VISIT NOTE  Jerome Shaffer Date of Birth:  [birth year] Medical Record Number:  [id number]   Referring MD: Therisa Holm, PA-C  Reason for Referral: Stroke  HPI: Initial visit 06/15/2023 for Jerome Shaffer is a pleasant 68 year old Caucasian male seen today for initial office consultation visit for stroke.  History is obtained from the patient and review of electronic medical records and I personally reviewed pertinent available imaging films in PACS.  He has past medical history of hypertension, arthritis and right hemispheric TIA in 2017 who on 05/17/2023 developed sudden onset of left upper extremity numbness involving the medial 3 fingers of the left hand up to the wrist.  He initially thought he had a pinched nerve but he has not slept on anything unusual.  The tingling improved after a day or so but he still has some residual numbness.  He had an outpatient MRI scan done on 05/31/2023 which shows a right parietal postcentral gyrus cortical infarct.  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  There was moderate foraminal stenosis on the right at C2-3, C4-5, C5-6 and C7-T1.  Patient denied any headache, hand weakness, gait or balance difficulties.  Denies any prior history of palpitations, atrial fibrillation, syncopal events or cardiac arrhythmias.  He has been diagnosed with a cardiac murmur and does follow-up with a cardiologist.  He had been on aspirin  81 mg daily following his right hemispheric TIA in 2017.  At that time he was visiting another state when he developed sudden onset of lack of control in the left hand with numbness.  That lasted about an hour or 2 and recovered completely.  He remembers having extensive workup in the hospital then including MRI scan  echocardiogram as well as a TEE all of which was unremarkable and he was asked to control his blood pressure and take aspirin  every day.  He denies any family history of strokes or TIAs.  He does not smoke cigarettes and drinks alcohol only socially.  He denies history of sleep apnea.  Lab work on 05/13/2023 showed LDL-cholesterol to be 60 mg percent and hemoglobin A1c to be 6.2.  He is currently on Lipitor 10 mg daily and aspirin  81 mg daily. Update 01/11/2024 : He returns for follow-up after last visit 7 months ago.  He states he is doing well.  He still has some residual paresthesia in the left hand which have not resolved.  He is however independent in all activities of daily living.  MR angiogram of the brain and neck which I had ordered on 07/09/2023 shows no significant extracranial or intracranial large vessel stenosis.  He is still bothered by his knee pain.  He plans to undergo knee replacement surgery later this month.  He wants neurological clearance for the same and to hold Plavix  for 5 days prior to the procedure.  He is tolerating Plavix  well with minor bruising and no bleeding.  States his had recent lab work which showed LDL cholesterol not to be satisfactory and primary care physician increase the Lipitor to 20 mg daily.  States blood pressure is under good control.  Has had no recurrent stroke or TIA symptoms.  He had a loop recorder inserted at the end of May and so far interrogation on 01/04/2024 has not shown any  atrial fibrillation.  He had a CT angiogram of the chest done which showed a calcific type cuspid aortic stenosis and cardiologist have recommended conservative follow-up for the same.  He has no new complaints. ROS:   14 system review of systems is positive for numbness, tingling, weakness, knee pain, gait difficulty all other systems negative  PMH:  Past Medical History:  Diagnosis Date   Arthritis    Hypertension    Stroke (HCC)    TIA (transient ischemic attack)     Social  History:  Social History   Socioeconomic History   Marital status: Married    Spouse name: Not on file   Number of children: Not on file   Years of education: Not on file   Highest education level: Not on file  Occupational History   Not on file  Tobacco Use   Smoking status: Never   Smokeless tobacco: Never  Vaping Use   Vaping status: Never Used  Substance and Sexual Activity   Alcohol use: Yes    Alcohol/week: 3.0 standard drinks of alcohol    Types: 3 Glasses of wine per week   Drug use: No   Sexual activity: Yes  Other Topics Concern   Not on file  Social History Narrative   Not on file   Social Drivers of Health   Financial Resource Strain: Not on file  Food Insecurity: Not on file  Transportation Needs: Not on file  Physical Activity: Not on file  Stress: Not on file  Social Connections: Not on file  Intimate Partner Violence: Not on file    Medications:   Current Outpatient Medications on File Prior to Visit  Medication Sig Dispense Refill   amLODipine  (NORVASC ) 5 MG tablet Take 5 mg by mouth daily.  1   Ascorbic Acid  (VITAMIN C ) 1000 MG tablet Take 1,000 mg by mouth daily.     atorvastatin  (LIPITOR) 20 MG tablet Take 20 mg by mouth daily.     Cholecalciferol (VITAMIN D3) 125 MCG (5000 UT) CAPS Take by mouth daily.     clopidogrel  (PLAVIX ) 75 MG tablet Take 1 tablet (75 mg total) by mouth daily. 30 tablet 11   Flaxseed, Linseed, (FLAXSEED OIL) 1000 MG CAPS Take 1,000 mg by mouth daily.     Ibuprofen-Diphenhydramine  HCl (ADVIL PM) 200-25 MG CAPS Take 1-2 tablets by mouth at bedtime as needed (for pain/sleep.).     Multiple Vitamin (MULTIVITAMIN WITH MINERALS) TABS tablet Take 1 tablet by mouth daily. Multivitamin for 50+     olmesartan (BENICAR) 5 MG tablet Take 5 mg by mouth daily.     tadalafil (CIALIS) 5 MG tablet Take 5 mg by mouth daily as needed.     tamsulosin (FLOMAX) 0.4 MG CAPS capsule Take 1 capsule by mouth daily.     amoxicillin (AMOXIL) 500 MG  capsule Take 1,000 mg by mouth 2 (two) times daily. Prior to dental appt. (Patient not taking: Reported on 01/11/2024)     No current facility-administered medications on file prior to visit.    Allergies:   Allergies  Allergen Reactions   No Known Allergies     Physical Exam General: well developed, well nourished pleasant middle-age Caucasian male, seated, in no evident distress Head: head normocephalic and atraumatic.   Neck: supple with no carotid or supraclavicular bruits Cardiovascular: regular rate and rhythm, no murmurs Musculoskeletal: no deformity Skin:  no rash/petichiae Vascular:  Normal pulses all extremities  Neurologic Exam Mental Status: Awake and fully alert.  Oriented to place and time. Recent and remote memory intact. Attention span, concentration and fund of knowledge appropriate. Mood and affect appropriate.  Cranial Nerves: Fundoscopic exam not done. Pupils equal, briskly reactive to light. Extraocular movements full without nystagmus. Visual fields full to confrontation. Hearing intact. Facial sensation intact. Face, tongue, palate moves normally and symmetrically.  Motor: Normal bulk and tone. Normal strength in all tested extremity muscles.  Diminished fine finger movements on the left.  RIGHT over left upper extremity. Sensory.:  Diminished touch and pinprick sensation in the left hand in the medial 2 fingers up to the wrist. Coordination: Rapid alternating movements normal in all extremities. Finger-to-nose and heel-to-shin performed accurately bilaterally. Gait and Station: Arises from chair without difficulty. Stance is normal. Gait demonstrates normal stride length and balance . Able to heel, toe and tandem walk with slight difficulty.  Reflexes: 1+ and symmetric. Toes downgoing.   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  Vascular risk factors of mild hyperlipidemia , borderline diabetes and previous TIA in 2017     PLAN:I had a long d/w patient about his remote cryptogenic embolic stroke, risk for recurrent stroke/TIAs, personally independently reviewed imaging studies and stroke evaluation results and answered questions . Continue Plavix  75 mg daily   for secondary stroke prevention and maintain strict control of hypertension with blood pressure goal below 130/90, diabetes with hemoglobin A1c goal below 6.5% and lipids with LDL cholesterol goal below 70 mg/dL. I also advised the patient to eat a healthy diet with plenty of whole grains, cereals, fruits and vegetables, exercise regularly and maintain ideal body weight  .  Patient wants to undergo left knee replacement surgery later this month.  He is neurologically cleared for the surgery and can hold Plavix  for 3 to 5 days prior to the procedure with a small but acceptable periprocedural risk of TIA/stroke if he is willing.  He was advised to resume Plavix  after procedure when safe.  Return for follow-up in the future only as needed and schedule appointment was made. Greater than 50% time during this 35-minute consultation visit was spent in counseling and coordination of care about his cryptogenic embolic stroke and discussion about need for further stroke evaluation and treatment and prevention and answering questions Eather Popp, MD Note: This document was prepared with digital dictation and possible smart phrase technology. Any transcriptional errors that result from this process are unintentional.

## 2024-01-11 NOTE — Patient Instructions (Signed)
 I had a long d/w patient about his remote cryptogenic embolic stroke, risk for recurrent stroke/TIAs, personally independently reviewed imaging studies and stroke evaluation results and answered questions . Continue Plavix  75 mg daily   for secondary stroke prevention and maintain strict control of hypertension with blood pressure goal below 130/90, diabetes with hemoglobin A1c goal below 6.5% and lipids with LDL cholesterol goal below 70 mg/dL. I also advised the patient to eat a healthy diet with plenty of whole grains, cereals, fruits and vegetables, exercise regularly and maintain ideal body weight  .  Patient wants to undergo left knee replacement surgery later this month.  He is neurologically cleared for the surgery and can hold Plavix  for 3 to 5 days prior to the procedure with a small but acceptable periprocedural risk of TIA/stroke if he is willing.  He was advised to resume Plavix  after procedure when safe.  Return for follow-up in the future only as needed and schedule appointment was made.

## 2024-01-12 ENCOUNTER — Telehealth: Payer: Self-pay | Admitting: *Deleted

## 2024-01-12 NOTE — Progress Notes (Deleted)
 Virtual Visit via Telephone Note   Because of [name] co-morbid illnesses, he is at least at moderate risk for complications without adequate follow up.  This format is felt to be most appropriate for this patient at this time.  Due to technical limitations with video connection (technology), today's appointment will be conducted as an audio only telehealth visit, and [name] verbally agreed to proceed in this manner.   All issues noted in this document were discussed and addressed.  No physical exam could be performed with this format.  Evaluation Performed:  Preoperative cardiovascular risk assessment _____________   Date:  01/12/2024   Patient ID:  Jerome Shaffer, DOB 08/22/1955, MRN [id number] Patient Location:  Home Provider location:   Office  Primary Care Provider:  Alben Therisa MATSU, PA Primary Cardiologist:  None  Chief Complaint / Patient Profile   68 y.o. y/o male with a h/o cryptogenic stroke s/p ILR, bicuspid aortic valve with aortic valve stenosis, ascending aortic dilation, and hypertension who is pending right TKA on 03/13/2024 with Dr. Garnette Aspen of [facility] and presents today for telephonic preoperative cardiovascular risk assessment.  History of Present Illness    Jerome LAMOUNTAIN is a 68 y.o. male who presents via audio/video conferencing for a telehealth visit today.  Pt was last seen in cardiology clinic on 12/03/2023 by Dr. Cindie.  At that time Jerome Shaffer was doing well.  The patient is now pending procedure as outlined above. Since his last visit, he ***  He denies chest pain, palpitations, dyspnea, pnd, orthopnea, n, v, dizziness, syncope, edema, weight gain, or early satiety. All other systems reviewed and are otherwise negative except as noted above.   Past Medical History    Past Medical History:  Diagnosis Date   Arthritis    Hypertension    Stroke  Jerome Shaffer)    TIA (transient ischemic attack)    Past Surgical History:  Procedure Laterality Date   APPENDECTOMY     COLONOSCOPY     ESOPHAGOGASTRODUODENOSCOPY     EYE SURGERY     lasic, retina tear repair   TOTAL HIP ARTHROPLASTY Left 04/27/2016   Procedure: LEFT TOTAL HIP ARTHROPLASTY ANTERIOR APPROACH;  Surgeon: Redell Shoals, MD;  Location: [facility] OR;  Service: Orthopedics;  Laterality: Left;  Needs RNFA   TOTAL HIP ARTHROPLASTY Right 06/15/2016   Procedure: RIGHT TOTAL HIP ARTHROPLASTY ANTERIOR APPROACH;  Surgeon: Redell Shoals, MD;  Location: [facility] OR;  Service: Orthopedics;  Laterality: Right;   vasctectomy      Allergies  Allergies  Allergen Reactions   No Known Allergies     Home Medications    Prior to Admission medications   Medication Sig Start Date End Date Taking? Authorizing Provider  amLODipine  (NORVASC ) 5 MG tablet Take 5 mg by mouth daily. 01/30/16   [provider]  amoxicillin (AMOXIL) 500 MG capsule Take 1,000 mg by mouth 2 (two) times daily. Prior to dental appt. Patient not taking: Reported on 01/11/2024 04/12/23   [provider]  Ascorbic Acid  (VITAMIN C ) 1000 MG tablet Take 1,000 mg by mouth daily.    [provider]  atorvastatin  (LIPITOR) 20 MG tablet Take 20 mg by mouth daily.    [provider]  Cholecalciferol (VITAMIN D3) 125 MCG (5000 UT) CAPS Take by mouth daily.    [provider]  clopidogrel  (PLAVIX ) 75 MG tablet Take 1 tablet (75 mg total)  by mouth daily. 06/15/23   Sethi, Pramod S, MD  Flaxseed, Linseed, (FLAXSEED OIL) 1000 MG CAPS Take 1,000 mg by mouth daily.    [provider]  Ibuprofen-Diphenhydramine  HCl (ADVIL PM) 200-25 MG CAPS Take 1-2 tablets by mouth at bedtime as needed (for pain/sleep.).    [provider]  Multiple Vitamin (MULTIVITAMIN WITH MINERALS) TABS tablet Take 1 tablet by mouth daily. Multivitamin for 50+    [provider]  olmesartan (BENICAR) 5 MG tablet  Take 5 mg by mouth daily.    [provider]  tadalafil (CIALIS) 5 MG tablet Take 5 mg by mouth daily as needed.    [provider]  tamsulosin (FLOMAX) 0.4 MG CAPS capsule Take 1 capsule by mouth daily. 08/10/23   [provider]    Physical Exam    Vital Signs:  Jerome Shaffer does not have vital signs available for review today.***  Given telephonic nature of communication, physical exam is limited. AAOx3. NAD. Normal affect.  Speech and respirations are unlabored.  Accessory Clinical Findings    None  Assessment & Plan    1.  Preoperative Cardiovascular Risk Assessment:  The patient was advised that if he develops new symptoms prior to surgery to contact our office to arrange for a follow-up visit, and he verbalized understanding.  Patient takes Plavix  for history of CVA.  This is not managed by cardiology.  Recommendations for holding Plavix  prior to surgery should come from managing provider (PCP versus neurology).  A copy of this note will be routed to requesting surgeon.  Time:   Today, I have spent *** minutes with the patient with telehealth technology discussing medical history, symptoms, and management plan.     Damien JAYSON Braver, NP  01/12/2024, 8:42 AM

## 2024-01-12 NOTE — Telephone Encounter (Signed)
   Name: Jerome Shaffer  DOB: [birth year]  MRN: [id number]  Primary Cardiologist: Dr.Cooper EP: Dr. Cindie   Chart reviewed as part of pre-operative Damien Braver, DNP on 01/14/2024 (on the schedule) at which time clearance can be addressed in case there are any issues that would impact surgical recommendations.  Left total knee arthroplasty Is not scheduled until 01/31/2024 as below. I added preop FYI to appointment note so that provider is aware to address at time of outpatient visit.  Per office protocol the cardiology provider should forward their finalized clearance decision and recommendations regarding antiplatelet therapy to the requesting party below.  He can still do both clearances with that one tele visit on Friday.   Saw Dr. Cindie had loop recorder placed. Loop recorder interrogation on 01/05/2024 Loop interrogation reviewed. No arrhythmias. Normal histogram. Battery OK. Programmed parameters are appropriate.   Input on holding Plavix  as requested below so that this information is available to the clearing provider at time of patient's appointment.   I will route this message as FYI to requesting party and remove this message from the preop box as separate preop APP input not needed at this time.   Please call with any questions.  Lamarr Satterfield, NP  01/12/2024, 11:53 AM

## 2024-01-12 NOTE — Telephone Encounter (Signed)
 I s/w the pt who said he was told he might have to change his tele preop appt for 01/14/24 per the VM that was left for him this morning by the preop APP today. I explained that call was in error. However, speaking with the pt he does tell me that his 1st knee surgery for his left knee is 01/31/24. I explained that I only see the request for the 03/13/24 right knee. Pt explained to me that he recently had a stroke and he was told that anesthesia saw that he had a stroke they told him he needed to see cardiology.   I assured the pt that I will make sure the preop APP 01/14/24 is aware there are 2 knee surgeries to be cleared for, left 01/31/24 and right 03/13/24. Pt said thank you.   I then called Jerome Shaffer surgery scheduler for Dr. Rubie. I confirmed the left knee surgery. I stated per our protocol I will enter in the left knee into the chart as well for documentation sake. Jerome agreed to plan of care.      Pre-operative Risk Assessment    Patient Name: Jerome Shaffer  DOB: 15-Sep-1955 MRN: [id number]   Date of last office visit: 12/03/23 DR. LAMBERT, 09/13/23 DR. COOPER Date of next office visit: 01/14/24 TELE PREOP APPT    Request for Surgical Clearance    Procedure:  LEFT TOTAL KNEE  ARTHROPLASTY  Date of Surgery:  Clearance 01/31/24                                Surgeon:  DR. GARNETTE RUBIE Surgeon's Group or Practice Name: [facility]   Phone number:  [phone] Fax number:  [phone]   Type of Clearance Requested:   - Medical  - Pharmacy:  Hold Clopidogrel  (Plavix ) ; PLAVIX  IS MANAGED BY DR. ADELINA   Type of Anesthesia:  Spinal   Additional requests/questions:    Jerome Shaffer   01/12/2024, 11:39 AM

## 2024-01-14 ENCOUNTER — Telehealth

## 2024-01-14 ENCOUNTER — Ambulatory Visit: Attending: Internal Medicine

## 2024-01-14 DIAGNOSIS — Z01810 Encounter for preprocedural cardiovascular examination: Secondary | ICD-10-CM

## 2024-01-14 NOTE — Progress Notes (Signed)
 Virtual Visit via Telephone Note   Because of Jerome Shaffer co-morbid illnesses, he is at least at moderate risk for complications without adequate follow up.  This format is felt to be most appropriate for this patient at this time.  Due to technical limitations with video connection (technology), today's appointment will be conducted as an audio only telehealth visit, and Jerome Shaffer verbally agreed to proceed in this manner.   All issues noted in this document were discussed and addressed.  No physical exam could be performed with this format.  Evaluation Performed:  Preoperative cardiovascular risk assessment _____________   Date:  01/14/2024   Patient ID:  Jerome Shaffer, DOB 02/05/1956, MRN [id number] Patient Location:  Home Provider location:   Office  Primary Care Provider:  Alben Therisa MATSU, PA Primary Cardiologist:  None  Chief Complaint / Patient Profile  68 y.o. y/o male with a h/o cryptogenic stroke s/p ILR, bicuspid aortic valve with aortic valve stenosis, ascending aortic dilation and hypertension who is pending left TKA on 01/31/24 and right TKA on 03/13/24 with Dr. Garnette Aspen of [facility] sports medicine and joint replacement and presents today for telephonic preoperative cardiovascular risk assessment. History of Present Illness  Jerome Shaffer is a 68 y.o. male who presents via audio/video conferencing for a telehealth visit today.  Pt was last seen in cardiology clinic on 12/03/23 by Dr. Cindie.  At that time Jerome Shaffer was doing well.  The patient is now pending procedure as outlined above. Since his last visit, he has remained stable from a cardiac standpoint. Today denies chest pain, shortness of breath, lower extremity edema, fatigue, palpitations, melena, hematuria, hemoptysis, diaphoresis, weakness, presyncope, syncope, orthopnea, and PND.  Patient is able to achieve greater than 4 METS of activity, reports that  he regularly plays pickle ball and tolerates very well. Past Medical History    Past Medical History:  Diagnosis Date   Arthritis    Hypertension    Stroke [facility])    TIA (transient ischemic attack)    Past Surgical History:  Procedure Laterality Date   APPENDECTOMY     COLONOSCOPY     ESOPHAGOGASTRODUODENOSCOPY     EYE SURGERY     lasic, retina tear repair   TOTAL HIP ARTHROPLASTY Left 04/27/2016   Procedure: LEFT TOTAL HIP ARTHROPLASTY ANTERIOR APPROACH;  Surgeon: Redell Shoals, MD;  Location: [facility] OR;  Service: Orthopedics;  Laterality: Left;  Needs RNFA   TOTAL HIP ARTHROPLASTY Right 06/15/2016   Procedure: RIGHT TOTAL HIP ARTHROPLASTY ANTERIOR APPROACH;  Surgeon: Redell Shoals, MD;  Location: [facility] OR;  Service: Orthopedics;  Laterality: Right;   vasctectomy     Allergies Allergies  Allergen Reactions   No Known Allergies    Home Medications    Prior to Admission medications   Medication Sig Start Date End Date Taking? Authorizing Provider  amLODipine  (NORVASC ) 5 MG tablet Take 5 mg by mouth daily. 01/30/16   [provider]  amoxicillin (AMOXIL) 500 MG capsule Take 1,000 mg by mouth 2 (two) times daily. Prior to dental appt. Patient not taking: Reported on 01/11/2024 04/12/23   [provider]  Ascorbic Acid  (VITAMIN C ) 1000 MG tablet Take 1,000 mg by mouth daily.    [provider]  atorvastatin  (LIPITOR) 20 MG tablet Take 20 mg by mouth daily.    [provider]  Cholecalciferol (VITAMIN D3) 125 MCG (5000 UT) CAPS Take by mouth daily.  [provider]  clopidogrel  (PLAVIX ) 75 MG tablet Take 1 tablet (75 mg total) by mouth daily. 06/15/23   Sethi, Pramod S, MD  Flaxseed, Linseed, (FLAXSEED OIL) 1000 MG CAPS Take 1,000 mg by mouth daily.    [provider]  Ibuprofen-Diphenhydramine  HCl (ADVIL PM) 200-25 MG CAPS Take 1-2 tablets by mouth at bedtime as needed (for pain/sleep.).    [provider]  Multiple Vitamin  (MULTIVITAMIN WITH MINERALS) TABS tablet Take 1 tablet by mouth daily. Multivitamin for 50+    [provider]  olmesartan (BENICAR) 5 MG tablet Take 5 mg by mouth daily.    [provider]  tadalafil (CIALIS) 5 MG tablet Take 5 mg by mouth daily as needed.    [provider]  tamsulosin (FLOMAX) 0.4 MG CAPS capsule Take 1 capsule by mouth daily. 08/10/23   [provider]    Physical Exam  Vital Signs:  Jerome Shaffer does not have vital signs available for review today. Given telephonic nature of communication, physical exam is limited. AAOx3. NAD. Normal affect.  Speech and respirations are unlabored. Accessory Clinical Findings  None Assessment & Plan    1.  Preoperative Cardiovascular Risk Assessment: [name] perioperative risk of a major cardiac event is 0.9% according to the Revised Cardiac Risk Index (RCRI).  Therefore, he is at low risk for perioperative complications.   His functional capacity is excellent at 9.89 METs according to the Duke Activity Status Index (DASI). Recommendations: According to ACC/AHA guidelines, no further cardiovascular testing needed.  The patient may proceed to surgery at acceptable risk.   Antiplatelet and/or Anticoagulation Recommendations: Patients Plavix  is managed by neurology, recommendations regarding holding of Plavix  will need to come from patients neurologist.   The patient was advised that if he develops new symptoms prior to surgery to contact our office to arrange for a follow-up visit, and he verbalized understanding.  A copy of this note will be routed to requesting surgeon.  Time:   Today, I have spent 12 minutes with the patient with telehealth technology discussing medical history, symptoms, and management plan.    Zalea Pete D Maitlyn Penza, NP  01/14/2024, 8:56 AM

## 2024-01-25 ENCOUNTER — Telehealth: Payer: Self-pay | Admitting: Cardiology

## 2024-01-25 NOTE — Telephone Encounter (Signed)
 Pt was evaluated by Dr Wonda on 09/13/2023 in regards to aortic stenosis. Pt noted to have moderate aortic stenosis, bicuspid AV and no evidence of thoracic aortic aneurysm.  Preop telephone visit was completed on 01/14/2024, per epic pt is pending left TKA on 01/31/24 and right TKA on 03/13/24 with Dr. Garnette Aspen.  Pt  contacted the office today due to anesthesia cancelling surgery due to diagnosis of moderate aortic stenosis.  I will forward message to Dr Wonda to assist with documentation in regards to patient being cleared for surgery from an aortic valve standpoint.

## 2024-01-25 NOTE — Telephone Encounter (Signed)
 Patient states his knee replacement surgery was cancelled due to his diagnosis of moderate aortic stenosis. He states Dr. Rubie performs surgeries at the [facility], and that facility's anesthesiologist is not agreeable with proceeding with surgery due to patient's diagnosis of moderate aortic stenosis.  Dr. Rubie also performs surgeries at [facility] and could move forward with procedure there, but patient cannot be scheduled until October 6th. Patient asked what the code is for his diagnosis. Informed patient I do not know the ICD-10 code for his diagnosis other than I35.0 for nonrheumatic aortic valve stenosis, which may not be specific enough.  Patient continued to inquire about his diagnosis, but unclear as to what he is needing to reschedule his surgery. Will forward to structural nurse navigator to see if she can assist.

## 2024-01-25 NOTE — Telephone Encounter (Signed)
 Patient would like to further clarify aortic valve stenosis/calcification diagnosis. He says his procedure was denied due to this and he just wanted a little more insight.

## 2024-01-25 NOTE — Telephone Encounter (Signed)
 Thanks. Moderate AS does not place him at high-risk of cardiac complications with surgery, but it would be appropriate to do his surgery at [facility] rather than an outpatient surgical center. Thanks

## 2024-01-26 NOTE — Telephone Encounter (Signed)
   Patient Name: Jerome Shaffer  DOB: August 26, 1955 MRN: [id number]  Primary Cardiologist: Dr. Wonda  Chart reviewed as part of pre-operative protocol coverage. Given past medical history and time since last visit, based on ACC/AHA guidelines, Jerome Shaffer is at acceptable risk for the planned procedure without further cardiovascular testing.   Per Dr.Cooper 01/25/2024 Moderate AS does not place him at high-risk of cardiac complications with surgery, but it would be appropriate to do his surgery at [facility] rather than an outpatient surgical center. Thanks    Preoperative Cardiovascular Risk Assessment: Jerome Shaffer perioperative risk of a major cardiac event is 0.9% according to the Revised Cardiac Risk Index (RCRI).  Therefore, he is at low risk for perioperative complications.   His functional capacity is excellent at 9.89 METs according to the Duke Activity Status Index (DASI). Recommendations: According to ACC/AHA guidelines, no further cardiovascular testing needed.  The patient may proceed to surgery at acceptable risk.   Antiplatelet and/or Anticoagulation Recommendations: Patients Plavix  is managed by neurology, recommendations regarding holding of Plavix  will need to come from patients neurologist.   The patient was advised that if he develops new symptoms prior to surgery to contact our office to arrange for a follow-up visit, and he verbalized understanding.  I will route this recommendation to the requesting party via Epic fax function and remove from pre-op pool.  Please call with questions.  Jerome Satterfield, NP 01/26/2024, 8:03 AM

## 2024-01-27 DIAGNOSIS — M25562 Pain in left knee: Secondary | ICD-10-CM | POA: Diagnosis not present

## 2024-02-02 ENCOUNTER — Ambulatory Visit: Payer: Self-pay | Admitting: Emergency Medicine

## 2024-02-02 DIAGNOSIS — M25562 Pain in left knee: Secondary | ICD-10-CM | POA: Diagnosis not present

## 2024-02-02 DIAGNOSIS — G8929 Other chronic pain: Secondary | ICD-10-CM

## 2024-02-02 NOTE — H&P (View-Only) (Signed)
 TOTAL KNEE ADMISSION H&P  Patient is being admitted for left total knee arthroplasty.  Subjective:  Chief Complaint:left knee pain.  HPI: Jerome Shaffer, 68 y.o. male, has a history of pain and functional disability in the left knee due to arthritis and has failed non-surgical conservative treatments for greater than 12 weeks to includeNSAID's and/or analgesics, corticosteriod injections, supervised PT with diminished ADL's post treatment, use of assistive devices, and activity modification.  Onset of symptoms was gradual, starting 1 years ago with gradually worsening course since that time. The patient noted no past surgery on the left knee(s).  Patient currently rates pain in the left knee(s) at 9 out of 10 with activity. Patient has night pain, worsening of pain with activity and weight bearing, pain that interferes with activities of daily living, and pain with passive range of motion.  Patient has evidence of periarticular osteophytes and joint space narrowing by imaging studies.  There is no active infection.  Patient Active Problem List   Diagnosis Date Noted   Cryptogenic stroke (HCC) 08/10/2023   Biceps tendon rupture, right, initial encounter 02/09/2022   Olecranon bursitis of left elbow 02/09/2022   Sports hernia 10/27/2021   Strain of right hip adductor muscle 09/03/2021   Traumatic plantar fasciitis 01/09/2021   Primary osteoarthritis of right hip 06/15/2016   Past Medical History:  Diagnosis Date   Arthritis    Hypertension    Stroke [facility])    TIA (transient ischemic attack)     Past Surgical History:  Procedure Laterality Date   APPENDECTOMY     COLONOSCOPY     ESOPHAGOGASTRODUODENOSCOPY     EYE SURGERY     lasic, retina tear repair   TOTAL HIP ARTHROPLASTY Left 04/27/2016   Procedure: LEFT TOTAL HIP ARTHROPLASTY ANTERIOR APPROACH;  Surgeon: Redell Shoals, MD;  Location: [facility] OR;  Service: Orthopedics;  Laterality: Left;  Needs RNFA   TOTAL HIP ARTHROPLASTY Right  06/15/2016   Procedure: RIGHT TOTAL HIP ARTHROPLASTY ANTERIOR APPROACH;  Surgeon: Redell Shoals, MD;  Location: [facility] OR;  Service: Orthopedics;  Laterality: Right;   vasctectomy      Current Outpatient Medications  Medication Sig Dispense Refill Last Dose/Taking   amLODipine  (NORVASC ) 5 MG tablet Take 5 mg by mouth daily.  1    amoxicillin (AMOXIL) 500 MG capsule Take 1,000 mg by mouth 2 (two) times daily. Prior to dental appt. (Patient not taking: Reported on 01/11/2024)      Ascorbic Acid  (VITAMIN C ) 1000 MG tablet Take 1,000 mg by mouth daily.      atorvastatin  (LIPITOR) 20 MG tablet Take 20 mg by mouth daily.      Cholecalciferol (VITAMIN D3) 125 MCG (5000 UT) CAPS Take by mouth daily.      clopidogrel  (PLAVIX ) 75 MG tablet Take 1 tablet (75 mg total) by mouth daily. 30 tablet 11    Flaxseed, Linseed, (FLAXSEED OIL) 1000 MG CAPS Take 1,000 mg by mouth daily.      Ibuprofen-Diphenhydramine  HCl (ADVIL PM) 200-25 MG CAPS Take 1-2 tablets by mouth at bedtime as needed (for pain/sleep.).      Multiple Vitamin (MULTIVITAMIN WITH MINERALS) TABS tablet Take 1 tablet by mouth daily. Multivitamin for 50+      olmesartan (BENICAR) 5 MG tablet Take 5 mg by mouth daily.      tadalafil (CIALIS) 5 MG tablet Take 5 mg by mouth daily as needed.      tamsulosin (FLOMAX) 0.4 MG CAPS capsule Take 1 capsule by mouth  daily.      No current facility-administered medications for this visit.   Allergies  Allergen Reactions   No Known Allergies     Social History   Tobacco Use   Smoking status: Never   Smokeless tobacco: Never  Substance Use Topics   Alcohol use: Yes    Alcohol/week: 3.0 standard drinks of alcohol    Types: 3 Glasses of wine per week    No family history on file.   Review of Systems  Musculoskeletal:  Positive for arthralgias.  All other systems reviewed and are negative.   Objective:  Physical Exam Constitutional:      General: He is not in acute distress.    Appearance:  Normal appearance. He is normal weight.  HENT:     Head: Normocephalic and atraumatic.  Eyes:     Extraocular Movements: Extraocular movements intact.     Conjunctiva/sclera: Conjunctivae normal.     Pupils: Pupils are equal, round, and reactive to light.  Cardiovascular:     Rate and Rhythm: Regular rhythm. Bradycardia present.     Pulses: Normal pulses.     Heart sounds: Murmur heard.  Pulmonary:     Effort: Pulmonary effort is normal. No respiratory distress.     Breath sounds: Normal breath sounds.  Abdominal:     General: Bowel sounds are normal. There is no distension.     Palpations: Abdomen is soft.     Tenderness: There is no abdominal tenderness.  Musculoskeletal:        General: Tenderness present.     Cervical back: Normal range of motion and neck supple.     Comments: TTP over medial and lateral joint line, medial worse than lateral.  No calf tenderness, swelling, or erythema.  No overlying lesions of area of chief complaint.  Decreased strength and ROM due to elicited pain.  Dorsiflexion and plantarflexion intact.  Stable to varus and valgus stress.  BLE appear grossly neurovascularly intact.  Gait mildly antalgic.   Lymphadenopathy:     Cervical: No cervical adenopathy.  Skin:    General: Skin is warm and dry.     Capillary Refill: Capillary refill takes less than 2 seconds.     Findings: No erythema or rash.  Neurological:     General: No focal deficit present.     Mental Status: He is alert and oriented to person, place, and time.  Psychiatric:        Mood and Affect: Mood normal.        Behavior: Behavior normal.     Vital signs in last 24 hours: @VSRANGES @  Labs:   Estimated body mass index is 23.13 kg/m as calculated from the following:   Height as of 01/11/24: 5' 5 (1.651 m).   Weight as of 01/11/24: 63 kg.   Imaging Review Plain radiographs demonstrate severe degenerative joint disease of the left knee(s). The overall alignment ismoderate varus  deformity. The bone quality appears to be good for age and reported activity level.      Assessment/Plan:  End stage arthritis, left knee   The patient history, physical examination, clinical judgment of the provider and imaging studies are consistent with end stage degenerative joint disease of the left knee(s) and total knee arthroplasty is deemed medically necessary. The treatment options including medical management, injection therapy arthroscopy and arthroplasty were discussed at length. The risks and benefits of total knee arthroplasty were presented and reviewed. The risks due to aseptic loosening, infection, stiffness, patella  tracking problems, thromboembolic complications and other imponderables were discussed. The patient acknowledged the explanation, agreed to proceed with the plan and consent was signed. Patient is being admitted for inpatient treatment for surgery, pain control, PT, OT, prophylactic antibiotics, VTE prophylaxis, progressive ambulation and ADL's and discharge planning. The patient is planning to be discharged home with OPPT    Anticipated LOS equal to or greater than 2 midnights due to - Age 63 and older with one or more of the following:  - Obesity  - Expected need for hospital services (PT, OT, Nursing) required for safe  discharge  - Anticipated need for postoperative skilled nursing care or inpatient rehab  - Active co-morbidities: moderate aortic stenosis, loop recorder implant, pre-diabetes, HTN, HLD, hx of CVA 2024 OR   - Unanticipated findings during/Post Surgery: None  - Patient is a high risk of re-admission due to: None

## 2024-02-02 NOTE — H&P (Signed)
 TOTAL KNEE ADMISSION H&P  Patient is being admitted for left total knee arthroplasty.  Subjective:  Chief Complaint:left knee pain.  HPI: Jerome Shaffer, 68 y.o. male, has a history of pain and functional disability in the left knee due to arthritis and has failed non-surgical conservative treatments for greater than 12 weeks to includeNSAID's and/or analgesics, corticosteriod injections, supervised PT with diminished ADL's post treatment, use of assistive devices, and activity modification.  Onset of symptoms was gradual, starting 1 years ago with gradually worsening course since that time. The patient noted no past surgery on the left knee(s).  Patient currently rates pain in the left knee(s) at 9 out of 10 with activity. Patient has night pain, worsening of pain with activity and weight bearing, pain that interferes with activities of daily living, and pain with passive range of motion.  Patient has evidence of periarticular osteophytes and joint space narrowing by imaging studies.  There is no active infection.  Patient Active Problem List   Diagnosis Date Noted   Cryptogenic stroke (HCC) 08/10/2023   Biceps tendon rupture, right, initial encounter 02/09/2022   Olecranon bursitis of left elbow 02/09/2022   Sports hernia 10/27/2021   Strain of right hip adductor muscle 09/03/2021   Traumatic plantar fasciitis 01/09/2021   Primary osteoarthritis of right hip 06/15/2016   Past Medical History:  Diagnosis Date   Arthritis    Hypertension    Stroke [facility])    TIA (transient ischemic attack)     Past Surgical History:  Procedure Laterality Date   APPENDECTOMY     COLONOSCOPY     ESOPHAGOGASTRODUODENOSCOPY     EYE SURGERY     lasic, retina tear repair   TOTAL HIP ARTHROPLASTY Left 04/27/2016   Procedure: LEFT TOTAL HIP ARTHROPLASTY ANTERIOR APPROACH;  Surgeon: Redell Shoals, MD;  Location: [facility] OR;  Service: Orthopedics;  Laterality: Left;  Needs RNFA   TOTAL HIP ARTHROPLASTY Right  06/15/2016   Procedure: RIGHT TOTAL HIP ARTHROPLASTY ANTERIOR APPROACH;  Surgeon: Redell Shoals, MD;  Location: [facility] OR;  Service: Orthopedics;  Laterality: Right;   vasctectomy      Current Outpatient Medications  Medication Sig Dispense Refill Last Dose/Taking   amLODipine  (NORVASC ) 5 MG tablet Take 5 mg by mouth daily.  1    amoxicillin (AMOXIL) 500 MG capsule Take 1,000 mg by mouth 2 (two) times daily. Prior to dental appt. (Patient not taking: Reported on 01/11/2024)      Ascorbic Acid  (VITAMIN C ) 1000 MG tablet Take 1,000 mg by mouth daily.      atorvastatin  (LIPITOR) 20 MG tablet Take 20 mg by mouth daily.      Cholecalciferol (VITAMIN D3) 125 MCG (5000 UT) CAPS Take by mouth daily.      clopidogrel  (PLAVIX ) 75 MG tablet Take 1 tablet (75 mg total) by mouth daily. 30 tablet 11    Flaxseed, Linseed, (FLAXSEED OIL) 1000 MG CAPS Take 1,000 mg by mouth daily.      Ibuprofen-Diphenhydramine  HCl (ADVIL PM) 200-25 MG CAPS Take 1-2 tablets by mouth at bedtime as needed (for pain/sleep.).      Multiple Vitamin (MULTIVITAMIN WITH MINERALS) TABS tablet Take 1 tablet by mouth daily. Multivitamin for 50+      olmesartan (BENICAR) 5 MG tablet Take 5 mg by mouth daily.      tadalafil (CIALIS) 5 MG tablet Take 5 mg by mouth daily as needed.      tamsulosin (FLOMAX) 0.4 MG CAPS capsule Take 1 capsule by mouth  daily.      No current facility-administered medications for this visit.   Allergies  Allergen Reactions   No Known Allergies     Social History   Tobacco Use   Smoking status: Never   Smokeless tobacco: Never  Substance Use Topics   Alcohol use: Yes    Alcohol/week: 3.0 standard drinks of alcohol    Types: 3 Glasses of wine per week    No family history on file.   Review of Systems  Musculoskeletal:  Positive for arthralgias.  All other systems reviewed and are negative.   Objective:  Physical Exam Constitutional:      General: He is not in acute distress.    Appearance:  Normal appearance. He is normal weight.  HENT:     Head: Normocephalic and atraumatic.  Eyes:     Extraocular Movements: Extraocular movements intact.     Conjunctiva/sclera: Conjunctivae normal.     Pupils: Pupils are equal, round, and reactive to light.  Cardiovascular:     Rate and Rhythm: Regular rhythm. Bradycardia present.     Pulses: Normal pulses.     Heart sounds: Murmur heard.  Pulmonary:     Effort: Pulmonary effort is normal. No respiratory distress.     Breath sounds: Normal breath sounds.  Abdominal:     General: Bowel sounds are normal. There is no distension.     Palpations: Abdomen is soft.     Tenderness: There is no abdominal tenderness.  Musculoskeletal:        General: Tenderness present.     Cervical back: Normal range of motion and neck supple.     Comments: TTP over medial and lateral joint line, medial worse than lateral.  No calf tenderness, swelling, or erythema.  No overlying lesions of area of chief complaint.  Decreased strength and ROM due to elicited pain.  Dorsiflexion and plantarflexion intact.  Stable to varus and valgus stress.  BLE appear grossly neurovascularly intact.  Gait mildly antalgic.   Lymphadenopathy:     Cervical: No cervical adenopathy.  Skin:    General: Skin is warm and dry.     Capillary Refill: Capillary refill takes less than 2 seconds.     Findings: No erythema or rash.  Neurological:     General: No focal deficit present.     Mental Status: He is alert and oriented to person, place, and time.  Psychiatric:        Mood and Affect: Mood normal.        Behavior: Behavior normal.     Vital signs in last 24 hours: @VSRANGES @  Labs:   Estimated body mass index is 23.13 kg/m as calculated from the following:   Height as of 01/11/24: 5' 5 (1.651 m).   Weight as of 01/11/24: 63 kg.   Imaging Review Plain radiographs demonstrate severe degenerative joint disease of the left knee(s). The overall alignment ismoderate varus  deformity. The bone quality appears to be good for age and reported activity level.      Assessment/Plan:  End stage arthritis, left knee   The patient history, physical examination, clinical judgment of the provider and imaging studies are consistent with end stage degenerative joint disease of the left knee(s) and total knee arthroplasty is deemed medically necessary. The treatment options including medical management, injection therapy arthroscopy and arthroplasty were discussed at length. The risks and benefits of total knee arthroplasty were presented and reviewed. The risks due to aseptic loosening, infection, stiffness, patella  tracking problems, thromboembolic complications and other imponderables were discussed. The patient acknowledged the explanation, agreed to proceed with the plan and consent was signed. Patient is being admitted for inpatient treatment for surgery, pain control, PT, OT, prophylactic antibiotics, VTE prophylaxis, progressive ambulation and ADL's and discharge planning. The patient is planning to be discharged home with OPPT    Anticipated LOS equal to or greater than 2 midnights due to - Age 63 and older with one or more of the following:  - Obesity  - Expected need for hospital services (PT, OT, Nursing) required for safe  discharge  - Anticipated need for postoperative skilled nursing care or inpatient rehab  - Active co-morbidities: moderate aortic stenosis, loop recorder implant, pre-diabetes, HTN, HLD, hx of CVA 2024 OR   - Unanticipated findings during/Post Surgery: None  - Patient is a high risk of re-admission due to: None

## 2024-02-03 DIAGNOSIS — M1712 Unilateral primary osteoarthritis, left knee: Secondary | ICD-10-CM | POA: Diagnosis not present

## 2024-02-03 DIAGNOSIS — R262 Difficulty in walking, not elsewhere classified: Secondary | ICD-10-CM | POA: Diagnosis not present

## 2024-02-04 ENCOUNTER — Ambulatory Visit

## 2024-02-04 DIAGNOSIS — I639 Cerebral infarction, unspecified: Secondary | ICD-10-CM | POA: Diagnosis not present

## 2024-02-04 LAB — CUP PACEART REMOTE DEVICE CHECK
Date Time Interrogation Session: 20250801043909
Implantable Pulse Generator Implant Date: 20250530

## 2024-02-07 ENCOUNTER — Ambulatory Visit: Payer: Self-pay | Admitting: Cardiology

## 2024-02-08 NOTE — Patient Instructions (Signed)
 SURGICAL WAITING ROOM VISITATION  Patients having surgery or a procedure may have no more than 2 support people in the waiting area - these visitors may rotate.    Children under the age of 63 must have an adult with them who is not the patient.  Visitors with respiratory illnesses are discouraged from visiting and should remain at home.  If the patient needs to stay at the hospital during part of their recovery, the visitor guidelines for inpatient rooms apply. Pre-op nurse will coordinate an appropriate time for 1 support person to accompany patient in pre-op.  This support person may not rotate.    Please refer to the [facility] website for the visitor guidelines for Inpatients (after your surgery is over and you are in a regular room).       Your procedure is scheduled on: 02/23/24   Report to [facility] Main Entrance    Report to admitting at  6 AM   Call this number if you have problems the morning of surgery [phone]   Do not eat food :After Midnight.   After Midnight you may have the following liquids until 5:30 AM DAY OF SURGERY  Water Non-Citrus Juices (without pulp, NO RED-Apple, White grape, White cranberry) Black Coffee (NO MILK/CREAM OR CREAMERS, sugar ok)  Clear Tea (NO MILK/CREAM OR CREAMERS, sugar ok) regular and decaf                             Plain Jell-O (NO RED)                                           Fruit ices (not with fruit pulp, NO RED)                                     Popsicles (NO RED)                                                               Sports drinks like Gatorade (NO RED)                  The day of surgery:  Drink ONE (1) Pre-Surgery Clear Ensure  at 5:30 AM the morning of surgery. Drink in one sitting. Do not sip.  This drink was given to you during your hospital  pre-op appointment visit. Nothing else to drink after completing the  Pre-Surgery Clear Ensure.    Oral Hygiene is also important to reduce your risk  of infection.                                    Remember - BRUSH YOUR TEETH THE MORNING OF SURGERY WITH YOUR REGULAR TOOTHPASTE  DENTURES WILL BE REMOVED PRIOR TO SURGERY PLEASE DO NOT APPLY Poly grip OR ADHESIVES!!!   Stop all vitamins and herbal supplements 7 days before surgery.   Take these medicines the morning of surgery with A SIP OF WATER: Amlodipine , Atorvastatin , tamsulosin  Do not take Olmesartan(Benicar) the morning of surgery.             You may not have any metal on your body including hair pins, jewelry, and body piercing             Do not wear make-up, lotions, powders, perfumes/cologne, or deodorant              Men may shave face and neck.   Do not bring valuables to the hospital. [facility] IS NOT             RESPONSIBLE   FOR VALUABLES.   Contacts, glasses, dentures or bridgework may not be worn into surgery.   Bring small overnight bag day of surgery.   DO NOT BRING YOUR HOME MEDICATIONS TO THE HOSPITAL. PHARMACY WILL DISPENSE MEDICATIONS LISTED ON YOUR MEDICATION LIST TO YOU DURING YOUR ADMISSION IN THE HOSPITAL!    Patients discharged on the day of surgery will not be allowed to drive home.  Someone NEEDS to stay with you for the first 24 hours after anesthesia.   Special Instructions: Bring a copy of your healthcare power of attorney and living will documents the day of surgery if you haven't scanned them before.              Please read over the following fact sheets you were given: IF YOU HAVE QUESTIONS ABOUT YOUR PRE-OP INSTRUCTIONS PLEASE CALL [phone] [place]   If you received a COVID test during your pre-op visit  it is requested that you wear a mask when out in public, stay away from anyone that may not be feeling well and notify your surgeon if you develop symptoms. If you test positive for Covid or have been in contact with anyone that has tested positive in the last 10 days please notify you surgeon.      Pre-operative 5 CHG  Bath Instructions   You can play a key role in reducing the risk of infection after surgery. Your skin needs to be as free of germs as possible. You can reduce the number of germs on your skin by washing with CHG (chlorhexidine  gluconate) soap before surgery. CHG is an antiseptic soap that kills germs and continues to kill germs even after washing.   DO NOT use if you have an allergy to chlorhexidine /CHG or antibacterial soaps. If your skin becomes reddened or irritated, stop using the CHG and notify one of our RNs at [phone].   Please shower with the CHG soap starting 4 days before surgery using the following schedule:     Please keep in mind the following:  DO NOT shave, including legs and underarms, starting the day of your first shower.   You may shave your face at any point before/day of surgery.  Place clean sheets on your bed the day you start using CHG soap. Use a clean washcloth (not used since being washed) for each shower. DO NOT sleep with pets once you start using the CHG.   CHG Shower Instructions:  If you choose to wash your hair and private area, wash first with your normal shampoo/soap.  After you use shampoo/soap, rinse your hair and body thoroughly to remove shampoo/soap residue.  Turn the water OFF and apply about 3 tablespoons (45 ml) of CHG soap to a CLEAN washcloth.  Apply CHG soap ONLY FROM YOUR NECK DOWN TO YOUR TOES (washing for 3-5 minutes)  DO NOT use CHG soap on face, private areas, open  wounds, or sores.  Pay special attention to the area where your surgery is being performed.  If you are having back surgery, having someone wash your back for you may be helpful. Wait 2 minutes after CHG soap is applied, then you may rinse off the CHG soap.  Pat dry with a clean towel  Put on clean clothes/pajamas   If you choose to wear lotion, please use ONLY the CHG-compatible lotions on the back of this paper.     Additional instructions for the day of surgery: DO  NOT APPLY any lotions, deodorants, cologne, or perfumes.   Put on clean/comfortable clothes.  Brush your teeth.  Ask your nurse before applying any prescription medications to the skin.      CHG Compatible Lotions   Aveeno Moisturizing lotion  Cetaphil Moisturizing Cream  Cetaphil Moisturizing Lotion  Clairol Herbal Essence Moisturizing Lotion, Dry Skin  Clairol Herbal Essence Moisturizing Lotion, Extra Dry Skin  Clairol Herbal Essence Moisturizing Lotion, Normal Skin  Curel Age Defying Therapeutic Moisturizing Lotion with Alpha Hydroxy  Curel Extreme Care Body Lotion  Curel Soothing Hands Moisturizing Hand Lotion  Curel Therapeutic Moisturizing Cream, Fragrance-Free  Curel Therapeutic Moisturizing Lotion, Fragrance-Free  Curel Therapeutic Moisturizing Lotion, Original Formula  Eucerin Daily Replenishing Lotion  Eucerin Dry Skin Therapy Plus Alpha Hydroxy Crme  Eucerin Dry Skin Therapy Plus Alpha Hydroxy Lotion  Eucerin Original Crme  Eucerin Original Lotion  Eucerin Plus Crme Eucerin Plus Lotion  Eucerin TriLipid Replenishing Lotion  Keri Anti-Bacterial Hand Lotion  Keri Deep Conditioning Original Lotion Dry Skin Formula Softly Scented  Keri Deep Conditioning Original Lotion, Fragrance Free Sensitive Skin Formula  Keri Lotion Fast Absorbing Fragrance Free Sensitive Skin Formula  Keri Lotion Fast Absorbing Softly Scented Dry Skin Formula  Keri Original Lotion  Keri Skin Renewal Lotion Keri Silky Smooth Lotion  Keri Silky Smooth Sensitive Skin Lotion  Nivea Body Creamy Conditioning Oil  Nivea Body Extra Enriched Lotion  Nivea Body Original Lotion  Nivea Body Sheer Moisturizing Lotion Nivea Crme  Nivea Skin Firming Lotion  NutraDerm 30 Skin Lotion  NutraDerm Skin Lotion  NutraDerm Therapeutic Skin Cream  NutraDerm Therapeutic Skin Lotion  ProShield Protective Hand Cream  Incentive Spirometer  An incentive spirometer is a tool that can help keep your lungs clear  and active. This tool measures how well you are filling your lungs with each breath. Taking long deep breaths may help reverse or decrease the chance of developing breathing (pulmonary) problems (especially infection) following: A long period of time when you are unable to move or be active. BEFORE THE PROCEDURE  If the spirometer includes an indicator to show your best effort, your nurse or respiratory therapist will set it to a desired goal. If possible, sit up straight or lean slightly forward. Try not to slouch. Hold the incentive spirometer in an upright position. INSTRUCTIONS FOR USE  Sit on the edge of your bed if possible, or sit up as far as you can in bed or on a chair. Hold the incentive spirometer in an upright position. Breathe out normally. Place the mouthpiece in your mouth and seal your lips tightly around it. Breathe in slowly and as deeply as possible, raising the piston or the ball toward the top of the column. Hold your breath for 3-5 seconds or for as long as possible. Allow the piston or ball to fall to the bottom of the column. Remove the mouthpiece from your mouth and breathe out normally. Rest for a few  seconds and repeat Steps 1 through 7 at least 10 times every 1-2 hours when you are awake. Take your time and take a few normal breaths between deep breaths. The spirometer may include an indicator to show your best effort. Use the indicator as a goal to work toward during each repetition. After each set of 10 deep breaths, practice coughing to be sure your lungs are clear. If you have an incision (the cut made at the time of surgery), support your incision when coughing by placing a pillow or rolled up towels firmly against it. Once you are able to get out of bed, walk around indoors and cough well. You may stop using the incentive spirometer when instructed by your caregiver.  RISKS AND COMPLICATIONS Take your time so you do not get dizzy or light-headed. If you are in  pain, you may need to take or ask for pain medication before doing incentive spirometry. It is harder to take a deep breath if you are having pain. AFTER USE Rest and breathe slowly and easily. It can be helpful to keep track of a log of your progress. Your caregiver can provide you with a simple table to help with this. If you are using the spirometer at home, follow these instructions: SEEK MEDICAL CARE IF:  You are having difficultly using the spirometer. You have trouble using the spirometer as often as instructed. Your pain medication is not giving enough relief while using the spirometer. You develop fever of 100.5 F (38.1 C) or higher. SEEK IMMEDIATE MEDICAL CARE IF:  You cough up bloody sputum that had not been present before. You develop fever of 102 F (38.9 C) or greater. You develop worsening pain at or near the incision site. MAKE SURE YOU:  Understand these instructions. Will watch your condition. Will get help right away if you are not doing well or get worse. Document Released: 11/02/2006 Document Revised: 09/14/2011 Document Reviewed: 01/03/2007   WHAT IS A BLOOD TRANSFUSION? Blood Transfusion Information  A transfusion is the replacement of blood or some of its parts. Blood is made up of multiple cells which provide different functions. Red blood cells carry oxygen and are used for blood loss replacement. White blood cells fight against infection. Platelets control bleeding. Plasma helps clot blood. Other blood products are available for specialized needs, such as hemophilia or other clotting disorders. BEFORE THE TRANSFUSION  Who gives blood for transfusions?  Healthy volunteers who are fully evaluated to make sure their blood is safe. This is blood bank blood. Transfusion therapy is the safest it has ever been in the practice of medicine. Before blood is taken from a donor, a complete history is taken to make sure that person has no history of diseases nor  engages in risky social behavior (examples are intravenous drug use or sexual activity with multiple partners). The donor's travel history is screened to minimize risk of transmitting infections, such as malaria. The donated blood is tested for signs of infectious diseases, such as HIV and hepatitis. The blood is then tested to be sure it is compatible with you in order to minimize the chance of a transfusion reaction. If you or a relative donates blood, this is often done in anticipation of surgery and is not appropriate for emergency situations. It takes many days to process the donated blood. RISKS AND COMPLICATIONS Although transfusion therapy is very safe and saves many lives, the main dangers of transfusion include:  Getting an infectious disease. Developing a transfusion  reaction. This is an allergic reaction to something in the blood you were given. Every precaution is taken to prevent this. The decision to have a blood transfusion has been considered carefully by your caregiver before blood is given. Blood is not given unless the benefits outweigh the risks. AFTER THE TRANSFUSION Right after receiving a blood transfusion, you will usually feel much better and more energetic. This is especially true if your red blood cells have gotten low (anemic). The transfusion raises the level of the red blood cells which carry oxygen, and this usually causes an energy increase. The nurse administering the transfusion will monitor you carefully for complications. HOME CARE INSTRUCTIONS  No special instructions are needed after a transfusion. You may find your energy is better. Speak with your caregiver about any limitations on activity for underlying diseases you may have. SEEK MEDICAL CARE IF:  Your condition is not improving after your transfusion. You develop redness or irritation at the intravenous (IV) site. SEEK IMMEDIATE MEDICAL CARE IF:  Any of the following symptoms occur over the next 12  hours: Shaking chills. You have a temperature by mouth above 102 F (38.9 C), not controlled by medicine. Chest, back, or muscle pain. People around you feel you are not acting correctly or are confused. Shortness of breath or difficulty breathing. Dizziness and fainting. You get a rash or develop hives. You have a decrease in urine output. Your urine turns a dark color or changes to pink, red, or brown. Any of the following symptoms occur over the next 10 days: You have a temperature by mouth above 102 F (38.9 C), not controlled by medicine. Shortness of breath. Weakness after normal activity. The white part of the eye turns yellow (jaundice). You have a decrease in the amount of urine or are urinating less often. Your urine turns a dark color or changes to pink, red, or brown. Document Released: 06/19/2000 Document Revised: 09/14/2011 Document Reviewed: 02/06/2008 [facility] Patient Information 2014 [place], [place].

## 2024-02-08 NOTE — Progress Notes (Signed)
 COVID Vaccine received:  [x]  No []  Yes Date of any COVID positive Test in last 90 days: no PCP - Dr. Alfrieda Pillar Cardiologist - Dr. Wonda Lek.- Ole Holts MD Neuro- Dr. Rosemarie  Chest x-ray - CT chest 09/30/23 Epic EKG -  08/10/23 Epic Stress Test -  ECHO - 08/27/23 Epic Cardiac Cath -   Cardiac clearance- 01/14/24- Rosabel Mose NP  Bowel Prep - [x]  No  []   Yes ______  Pacemaker / ICD device [x]  No []  Yes   Spinal Cord Stimulator:[x]  No []  Yes       History of Sleep Apnea? [x]  No []  Yes   CPAP used?- [x]  No []  Yes    Does the patient monitor blood sugar?          [x]  No []  Yes  []  N/A  Patient has: [x]  NO Hx DM   []  Pre-DM                 []  DM1  []   DM2 Does patient have a [employer] or Dexacom? []  No []  Yes   Fasting Blood Sugar Ranges-  Checks Blood Sugar _____ times a day  GLP1 agonist / usual dose - no GLP1 instructions:  SGLT-2 inhibitors / usual dose - no SGLT-2 instructions:   Blood Thinner / Instructions:Plavis last dose to be 02/16/24. Aspirin  Instructions:no  Comments:   Activity level: Patient is able to climb a flight of stairs without difficulty; [x]  No CP  [x]  No SOB,   Patient can perform ADLs without assistance.   Anesthesia review: HTN, murmur, Stroke, aortic stenosis.  Patient denies shortness of breath, fever, cough and chest pain at PAT appointment.  Patient verbalized understanding and agreement to the Pre-Surgical Instructions that were given to them at this PAT appointment. Patient was also educated of the need to review these PAT instructions again prior to his/her surgery.I reviewed the appropriate phone numbers to call if they have any and questions or concerns.

## 2024-02-10 ENCOUNTER — Encounter (HOSPITAL_COMMUNITY)
Admission: RE | Admit: 2024-02-10 | Discharge: 2024-02-10 | Disposition: A | Discharge status: Home / Self Care | Source: Ambulatory Visit | Attending: Orthopedic Surgery | Admitting: Orthopedic Surgery

## 2024-02-10 ENCOUNTER — Encounter (HOSPITAL_COMMUNITY): Payer: Self-pay

## 2024-02-10 ENCOUNTER — Other Ambulatory Visit: Payer: Self-pay

## 2024-02-10 VITALS — BP 127/79 | HR 73 | Temp 97.8°F | Resp 16 | Ht 64.0 in | Wt 140.0 lb

## 2024-02-10 DIAGNOSIS — R7303 Prediabetes: Secondary | ICD-10-CM | POA: Insufficient documentation

## 2024-02-10 DIAGNOSIS — M25562 Pain in left knee: Secondary | ICD-10-CM | POA: Diagnosis not present

## 2024-02-10 DIAGNOSIS — G8929 Other chronic pain: Secondary | ICD-10-CM | POA: Insufficient documentation

## 2024-02-10 DIAGNOSIS — Z01818 Encounter for other preprocedural examination: Secondary | ICD-10-CM | POA: Insufficient documentation

## 2024-02-10 HISTORY — DX: Cardiac murmur, unspecified: R01.1

## 2024-02-10 LAB — COMPREHENSIVE METABOLIC PANEL WITH GFR
ALT: 18 U/L (ref 0–44)
AST: 19 U/L (ref 15–41)
Albumin: 4 g/dL (ref 3.5–5.0)
Alkaline Phosphatase: 73 U/L (ref 38–126)
Anion gap: 6 (ref 5–15)
BUN: 22 mg/dL (ref 8–23)
CO2: 28 mmol/L (ref 22–32)
Calcium: 10.1 mg/dL (ref 8.9–10.3)
Chloride: 106 mmol/L (ref 98–111)
Creatinine, Ser: 0.96 mg/dL (ref 0.61–1.24)
GFR, Estimated: >60 mL/min (ref >60)
Glucose, Bld: 115 mg/dL — ABNORMAL HIGH (ref 70–99)
Potassium: 5.1 mmol/L (ref 3.5–5.1)
Sodium: 140 mmol/L (ref 135–145)
Total Bilirubin: 1.2 mg/dL (ref 0.0–1.2)
Total Protein: 7.1 g/dL (ref 6.5–8.1)

## 2024-02-10 LAB — CBC WITH DIFFERENTIAL/PLATELET
Abs Immature Granulocytes: 0.01 K/uL (ref 0.00–0.07)
Basophils Absolute: 0 K/uL (ref 0.0–0.1)
Basophils Relative: 1 %
Eosinophils Absolute: 0.1 K/uL (ref 0.0–0.5)
Eosinophils Relative: 2 %
HCT: 43.2 % (ref 39.0–52.0)
Hemoglobin: 14.2 g/dL (ref 13.0–17.0)
Immature Granulocytes: 0 %
Lymphocytes Relative: 24 %
Lymphs Abs: 1.2 K/uL (ref 0.7–4.0)
MCH: 30.5 pg (ref 26.0–34.0)
MCHC: 32.9 g/dL (ref 30.0–36.0)
MCV: 92.9 fL (ref 80.0–100.0)
Monocytes Absolute: 0.6 K/uL (ref 0.1–1.0)
Monocytes Relative: 13 %
Neutro Abs: 2.9 K/uL (ref 1.7–7.7)
Neutrophils Relative %: 60 %
Platelets: 228 K/uL (ref 150–400)
RBC: 4.65 MIL/uL (ref 4.22–5.81)
RDW: 14.3 % (ref 11.5–15.5)
WBC: 4.8 K/uL (ref 4.0–10.5)
nRBC: 0 % (ref 0.0–0.2)

## 2024-02-10 LAB — SURGICAL PCR SCREEN
MRSA, PCR: NEGATIVE
Staphylococcus aureus: NEGATIVE

## 2024-02-10 LAB — HEMOGLOBIN A1C
Hgb A1c MFr Bld: 5.7 % — ABNORMAL HIGH (ref 4.8–5.6)
Mean Plasma Glucose: 117 mg/dL

## 2024-02-10 LAB — TYPE AND SCREEN
ABO/RH(D): A NEG
Antibody Screen: NEGATIVE

## 2024-02-11 NOTE — Anesthesia Preprocedure Evaluation (Addendum)
 Anesthesia Evaluation  Patient identified by MRN, date of birth, ID band Patient awake    Reviewed: Allergy & Precautions, H&P , NPO status , Patient's Chart, lab work & pertinent test results  History of Anesthesia Complications Negative for: history of anesthetic complications  Airway Mallampati: II  TM Distance: >3 FB Neck ROM: Full    Dental no notable dental hx.    Pulmonary neg pulmonary ROS   Pulmonary exam normal breath sounds clear to auscultation       Cardiovascular hypertension, (-) Past MI Normal cardiovascular exam+ Valvular Problems/Murmurs AS  Rhythm:Regular Rate:Normal  IMPRESSIONS     1. 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   2. Left ventricular ejection fraction, by estimation, is 55 to 60%. The  left ventricle has normal function. The left ventricle has no regional  wall motion abnormalities. Left ventricular diastolic parameters are  consistent with Grade II diastolic  dysfunction (pseudonormalization). The average left ventricular global  longitudinal strain is -17.7 %. The global longitudinal strain is normal.   3. Right ventricular systolic function is normal. The right ventricular  size is normal.   4. The mitral valve is normal in structure. Mild mitral valve  regurgitation. No evidence of mitral stenosis.   5. The aortic valve is calcified. Aortic valve regurgitation is mild.  Moderate aortic valve stenosis.   6. Aortic dilatation noted. There is mild dilatation of the aortic root,  measuring 40 mm.   7. The inferior vena cava is normal in size with greater than 50%  respiratory variability, suggesting right atrial pressure of 3 mmHg.     Neuro/Psych TIACVA  negative psych ROS   GI/Hepatic negative GI ROS, Neg liver ROS,,,  Endo/Other  negative endocrine ROS    Renal/GU negative Renal ROS  negative genitourinary   Musculoskeletal  (+)  Arthritis , Osteoarthritis,    Abdominal   Peds negative pediatric ROS (+)  Hematology negative hematology ROS (+)   Anesthesia Other Findings   Reproductive/Obstetrics negative OB ROS                              Anesthesia Physical Anesthesia Plan  ASA: 3  Anesthesia Plan: General and Regional   Post-op Pain Management: Tylenol  PO (pre-op)* and Regional block*   Induction: Intravenous  PONV Risk Score and Plan: 2 and Ondansetron , Dexamethasone  and Treatment may vary due to age or medical condition  Airway Management Planned: Oral ETT  Additional Equipment: ClearSight  Intra-op Plan:   Post-operative Plan: Extubation in OR  Informed Consent: I have reviewed the patients History and Physical, chart, labs and discussed the procedure including the risks, benefits and alternatives for the proposed anesthesia with the patient or authorized representative who has indicated his/her understanding and acceptance.       Plan Discussed with: CRNA  Anesthesia Plan Comments: (See PAT note 02/10/2024  68 y.o. 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 Toribio Higashi.    Per cardiology preoperative evaluation 01/26/24, Chart reviewed as part of pre-operative protocol coverage. Given past medical history and time since last visit, based on ACC/AHA guidelines, PAGE LANCON is at acceptable risk for the planned procedure without further cardiovascular testing.    Per Dr.Cooper 01/25/2024 Moderate AS does not place  him at high-risk of cardiac complications with surgery, but it would be appropriate to do his surgery at [facility] rather than an outpatient surgical center. Thanks    Preoperative Cardiovascular Risk Assessment: Mr. Hoglund perioperative risk of a major cardiac event is 0.9%  according to the Revised Cardiac Risk Index (RCRI).  Therefore, he is at low risk for perioperative complications.   His functional capacity is excellent at 9.89 METs according to the Duke Activity Status Index (DASI). Recommendations: According to ACC/AHA guidelines, no further cardiovascular testing needed.  The patient may proceed to surgery at acceptable risk.   Antiplatelet and/or Anticoagulation Recommendations: Patients Plavix  is managed by neurology, recommendations regarding holding of Plavix  will need to come from patients neurologist.    Pt last seen by neurology 01/11/2024. Per notes, He is neurologically cleared for the surgery and can hold Plavix  for 3 to 5 days prior to the procedure with a small but acceptable periprocedural risk of TIA/stroke if he is willing. He was advised to resume Plavix  after procedure when safe. Return for follow-up in the future only as needed and schedule appointment was made.   )         Anesthesia Quick Evaluation

## 2024-02-11 NOTE — Progress Notes (Signed)
 Anesthesia Chart Review   Case: [id number] Date/Time: 02/23/24 0815   Procedure: ARTHROPLASTY, KNEE, TOTAL (Left: Knee)   Anesthesia type: General   Pre-op diagnosis: OA LEFT KNEE   Location: [facility] ROOM 08 / [facility]   Surgeons: Edna Toribio LABOR, MD       DISCUSSION:68 y.o. 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 Toribio Edna.   Per cardiology preoperative evaluation 01/26/24, Chart reviewed as part of pre-operative protocol coverage. Given past medical history and time since last visit, based on ACC/AHA guidelines, Jerome Shaffer is at acceptable risk for the planned procedure without further cardiovascular testing.    Per Dr.Cooper 01/25/2024 Moderate AS does not place him at high-risk of cardiac complications with surgery, but it would be appropriate to do his surgery at [facility] rather than an outpatient surgical center. Thanks    Preoperative Cardiovascular Risk Assessment: Jerome Shaffer perioperative risk of a major cardiac event is 0.9% according to the Revised Cardiac Risk Index (RCRI).  Therefore, Jerome Shaffer is at low risk for perioperative complications.   His functional capacity is excellent at 9.89 METs according to the Duke Activity Status Index (DASI). Recommendations: According to ACC/AHA guidelines, no further cardiovascular testing needed.  The patient may proceed to surgery at acceptable risk.   Antiplatelet and/or Anticoagulation Recommendations: Patients Plavix  is managed by neurology, recommendations regarding holding of Plavix  will need to come from patients neurologist.   Pt last seen by neurology 01/11/2024. Per notes, Jerome Shaffer is neurologically cleared for the surgery and can hold Plavix  for 3 to 5 days prior to the procedure with a small but acceptable periprocedural risk of TIA/stroke if Jerome Shaffer is willing. Jerome Shaffer  was advised to resume Plavix  after procedure when safe. Return for follow-up in the future only as needed and schedule appointment was made.   VS: BP 127/79   Pulse 73   Temp 36.6 C (Oral)   Resp 16   Ht 5' 4 (1.626 m)   Wt 63.5 kg   SpO2 97%   BMI 24.03 kg/m   PROVIDERS: Alben Therisa MATSU, PA is PCP   Wonda Sharper, MD is Cardiologist   Rosemarie Rothman, MD is Neurologist  LABS: Labs reviewed: Acceptable for surgery. (all labs ordered are listed, but only abnormal results are displayed)  Labs Reviewed  COMPREHENSIVE METABOLIC PANEL WITH GFR - Abnormal; Notable for the following components:      Result Value   Glucose, Bld 115 (*)    All other components within normal limits  HEMOGLOBIN A1C - Abnormal; Notable for the following components:   Hgb A1c MFr Bld 5.7 (*)    All other components within normal limits  SURGICAL PCR SCREEN  CBC WITH DIFFERENTIAL/PLATELET  TYPE AND SCREEN     IMAGES:   EKG:   CV: Echo 08/27/23 1. 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   2. Left ventricular ejection fraction, by estimation, is 55 to 60%. The  left ventricle has normal function. The left ventricle has no regional  wall motion abnormalities. Left ventricular diastolic parameters are  consistent with Grade II diastolic  dysfunction (pseudonormalization). The average left ventricular global  longitudinal strain is -17.7 %. The global longitudinal strain is normal.   3. Right ventricular systolic function is normal. The right ventricular  size is normal.  4. The mitral valve is normal in structure. Mild mitral valve  regurgitation. No evidence of mitral stenosis.   5. The aortic valve is calcified. Aortic valve regurgitation is mild.  Moderate aortic valve stenosis.   6. Aortic dilatation noted. There is mild dilatation of the aortic root,  measuring 40 mm.   7. The inferior vena cava is normal in size with greater than 50%   respiratory variability, suggesting right atrial pressure of 3 mmHg.  Past Medical History:  Diagnosis Date   Arthritis    Heart murmur    Hypertension    Stroke [facility])    TIA (transient ischemic attack)     Past Surgical History:  Procedure Laterality Date   APPENDECTOMY     COLONOSCOPY     ESOPHAGOGASTRODUODENOSCOPY     EYE SURGERY     lasic, retina tear repair   TOTAL HIP ARTHROPLASTY Left 04/27/2016   Procedure: LEFT TOTAL HIP ARTHROPLASTY ANTERIOR APPROACH;  Surgeon: Redell Shoals, MD;  Location: [facility] OR;  Service: Orthopedics;  Laterality: Left;  Needs RNFA   TOTAL HIP ARTHROPLASTY Right 06/15/2016   Procedure: RIGHT TOTAL HIP ARTHROPLASTY ANTERIOR APPROACH;  Surgeon: Redell Shoals, MD;  Location: [facility] OR;  Service: Orthopedics;  Laterality: Right;   vasctectomy      MEDICATIONS:  amLODipine  (NORVASC ) 5 MG tablet   amoxicillin (AMOXIL) 500 MG capsule   Ascorbic Acid  (VITAMIN C ) 1000 MG tablet   atorvastatin  (LIPITOR) 10 MG tablet   Cholecalciferol (VITAMIN D3) 125 MCG (5000 UT) CAPS   clopidogrel  (PLAVIX ) 75 MG tablet   Flaxseed, Linseed, (FLAXSEED OIL) 1000 MG CAPS   Multiple Vitamin (MULTIVITAMIN WITH MINERALS) TABS tablet   olmesartan (BENICAR) 5 MG tablet   OVER THE COUNTER MEDICATION   tadalafil (CIALIS) 5 MG tablet   tamsulosin (FLOMAX) 0.4 MG CAPS capsule   No current facility-administered medications for this encounter.    Harlene Hoots Ward, PA-C [facility] Pre-Surgical Testing [phone]

## 2024-02-17 NOTE — Care Plan (Signed)
 Ortho Bundle Case Management Note  Patient Details  Name: Jerome Shaffer MRN: [id number] Date of Birth: 21-Dec-1955    met with patient in the office for H&P. will discharge to home with family to assist. CPM and CTU ordered. OPPT set up with [facility]. discharge instructions discussed and questions answered. Patient and MD in agreement with plan. Choice offered.       DME Arranged:  CPM DME Agency:  [employer]  HH Arranged:    HH Agency:     Additional Comments: Please contact me with any questions of if this plan should need to change.  Charlies Pitch,  RN,BSN,MHA,CCM  [facility]  [phone] 02/17/2024, 10:06 AM

## 2024-02-21 ENCOUNTER — Ambulatory Visit: Payer: Medicare HMO | Admitting: Neurology

## 2024-02-23 ENCOUNTER — Observation Stay (HOSPITAL_COMMUNITY)

## 2024-02-23 ENCOUNTER — Other Ambulatory Visit: Payer: Self-pay

## 2024-02-23 ENCOUNTER — Encounter (HOSPITAL_COMMUNITY): Payer: Self-pay | Admitting: Orthopedic Surgery

## 2024-02-23 ENCOUNTER — Encounter (HOSPITAL_COMMUNITY)
Admission: RE | Disposition: A | Discharge status: Home / Self Care | Payer: Self-pay | Source: Ambulatory Visit | Attending: Orthopedic Surgery

## 2024-02-23 ENCOUNTER — Ambulatory Visit (HOSPITAL_BASED_OUTPATIENT_CLINIC_OR_DEPARTMENT_OTHER): Payer: Self-pay

## 2024-02-23 ENCOUNTER — Observation Stay (HOSPITAL_COMMUNITY)
Admission: RE | Admit: 2024-02-23 | Discharge: 2024-02-24 | Disposition: A | Discharge status: Home / Self Care | Source: Ambulatory Visit | Attending: Orthopedic Surgery | Admitting: Orthopedic Surgery

## 2024-02-23 ENCOUNTER — Ambulatory Visit (HOSPITAL_COMMUNITY): Payer: Self-pay | Admitting: Physician Assistant

## 2024-02-23 DIAGNOSIS — Z471 Aftercare following joint replacement surgery: Secondary | ICD-10-CM | POA: Diagnosis not present

## 2024-02-23 DIAGNOSIS — Z8673 Personal history of transient ischemic attack (TIA), and cerebral infarction without residual deficits: Secondary | ICD-10-CM | POA: Insufficient documentation

## 2024-02-23 DIAGNOSIS — G8918 Other acute postprocedural pain: Secondary | ICD-10-CM | POA: Diagnosis not present

## 2024-02-23 DIAGNOSIS — Z79899 Other long term (current) drug therapy: Secondary | ICD-10-CM | POA: Diagnosis not present

## 2024-02-23 DIAGNOSIS — I10 Essential (primary) hypertension: Secondary | ICD-10-CM | POA: Insufficient documentation

## 2024-02-23 DIAGNOSIS — Z96641 Presence of right artificial hip joint: Secondary | ICD-10-CM | POA: Diagnosis not present

## 2024-02-23 DIAGNOSIS — Z7982 Long term (current) use of aspirin: Secondary | ICD-10-CM | POA: Diagnosis not present

## 2024-02-23 DIAGNOSIS — M25562 Pain in left knee: Secondary | ICD-10-CM | POA: Diagnosis present

## 2024-02-23 DIAGNOSIS — M1712 Unilateral primary osteoarthritis, left knee: Secondary | ICD-10-CM

## 2024-02-23 DIAGNOSIS — Z96652 Presence of left artificial knee joint: Secondary | ICD-10-CM | POA: Diagnosis not present

## 2024-02-23 DIAGNOSIS — R609 Edema, unspecified: Secondary | ICD-10-CM | POA: Diagnosis not present

## 2024-02-23 HISTORY — PX: TOTAL KNEE ARTHROPLASTY: SHX125

## 2024-02-23 SURGERY — ARTHROPLASTY, KNEE, TOTAL
Anesthesia: Regional | Site: Knee | Laterality: Left

## 2024-02-23 MED ORDER — METHOCARBAMOL 500 MG PO TABS: 500.0000 mg | ORAL_TABLET | Freq: Three times a day (TID) | ORAL | 0 refills | Status: DC | PRN

## 2024-02-23 MED ORDER — ACETAMINOPHEN 325 MG PO TABS: 325.0000 mg | ORAL_TABLET | Freq: Four times a day (QID) | ORAL | Status: DC | PRN

## 2024-02-23 MED ORDER — ZOLPIDEM TARTRATE 5 MG PO TABS
5.0000 mg | ORAL_TABLET | Freq: Every evening | ORAL | Status: DC | PRN
  Administered 2024-02-23: 5 mg via ORAL
  Filled 2024-02-23: qty 1

## 2024-02-23 MED ORDER — METHOCARBAMOL 500 MG PO TABS: 500.0000 mg | ORAL_TABLET | Freq: Three times a day (TID) | ORAL | Status: AC | PRN

## 2024-02-23 MED ORDER — HYDROMORPHONE HCL 2 MG/ML IJ SOLN
INTRAMUSCULAR | Status: AC
  Filled 2024-02-23: qty 1

## 2024-02-23 MED ORDER — CLOPIDOGREL BISULFATE 75 MG PO TABS: 75.0000 mg | ORAL_TABLET | Freq: Every day | ORAL | Status: AC

## 2024-02-23 MED ORDER — ATORVASTATIN CALCIUM 20 MG PO TABS
20.0000 mg | ORAL_TABLET | Freq: Every day | ORAL | Status: DC
  Filled 2024-02-23: qty 1

## 2024-02-23 MED ORDER — PROPOFOL 1000 MG/100ML IV EMUL
INTRAVENOUS | Status: AC
  Filled 2024-02-23: qty 100

## 2024-02-23 MED ORDER — OXYCODONE HCL 5 MG PO TABS
5.0000 mg | ORAL_TABLET | ORAL | Status: DC | PRN
  Administered 2024-02-23 (×3): 10 mg via ORAL
  Administered 2024-02-24: 5 mg via ORAL
  Filled 2024-02-23: qty 2
  Filled 2024-02-23: qty 1
  Filled 2024-02-23 (×2): qty 2

## 2024-02-23 MED ORDER — MIDAZOLAM HCL 2 MG/2ML IJ SOLN
INTRAMUSCULAR | Status: AC
  Filled 2024-02-23: qty 2

## 2024-02-23 MED ORDER — ONDANSETRON HCL 4 MG PO TABS: 4.0000 mg | ORAL_TABLET | Freq: Four times a day (QID) | ORAL | Status: DC | PRN

## 2024-02-23 MED ORDER — KETAMINE HCL 50 MG/5ML IJ SOSY
PREFILLED_SYRINGE | INTRAMUSCULAR | Status: DC | PRN
  Administered 2024-02-23: 30 mg via INTRAVENOUS
  Administered 2024-02-23: 10 mg via INTRAVENOUS

## 2024-02-23 MED ORDER — CEFAZOLIN SODIUM-DEXTROSE 2-4 GM/100ML-% IV SOLN
2.0000 g | INTRAVENOUS | Status: AC
  Administered 2024-02-23: 2 g via INTRAVENOUS
  Filled 2024-02-23: qty 100

## 2024-02-23 MED ORDER — ACETAMINOPHEN 500 MG PO TABS
1000.0000 mg | ORAL_TABLET | Freq: Once | ORAL | Status: AC
  Administered 2024-02-23: 1000 mg via ORAL
  Filled 2024-02-23: qty 2

## 2024-02-23 MED ORDER — LACTATED RINGERS IV SOLN: INTRAVENOUS | Status: DC

## 2024-02-23 MED ORDER — FENTANYL CITRATE (PF) 100 MCG/2ML IJ SOLN
INTRAMUSCULAR | Status: DC | PRN
  Administered 2024-02-23 (×2): 50 ug via INTRAVENOUS

## 2024-02-23 MED ORDER — KETAMINE HCL 50 MG/5ML IJ SOSY
PREFILLED_SYRINGE | INTRAMUSCULAR | Status: AC
  Filled 2024-02-23: qty 5

## 2024-02-23 MED ORDER — MIDAZOLAM HCL 2 MG/2ML IJ SOLN
INTRAMUSCULAR | Status: DC | PRN
  Administered 2024-02-23 (×2): 1 mg via INTRAVENOUS

## 2024-02-23 MED ORDER — DEXAMETHASONE SODIUM PHOSPHATE 10 MG/ML IJ SOLN
INTRAMUSCULAR | Status: AC
  Filled 2024-02-23: qty 1

## 2024-02-23 MED ORDER — WATER FOR IRRIGATION, STERILE IR SOLN
Status: DC | PRN
  Administered 2024-02-23: 2000 mL

## 2024-02-23 MED ORDER — OXYCODONE HCL 5 MG PO TABS: 5.0000 mg | ORAL_TABLET | ORAL | Status: AC | PRN

## 2024-02-23 MED ORDER — TAMSULOSIN HCL 0.4 MG PO CAPS
0.4000 mg | ORAL_CAPSULE | Freq: Every day | ORAL | Status: DC
  Administered 2024-02-24: 0.4 mg via ORAL
  Filled 2024-02-23: qty 1

## 2024-02-23 MED ORDER — DEXAMETHASONE SODIUM PHOSPHATE 10 MG/ML IJ SOLN: 8.0000 mg | Freq: Once | INTRAMUSCULAR | Status: DC

## 2024-02-23 MED ORDER — SODIUM CHLORIDE (PF) 0.9 % IJ SOLN
INTRAMUSCULAR | Status: AC
  Filled 2024-02-23: qty 30

## 2024-02-23 MED ORDER — PHENYLEPHRINE HCL-NACL 20-0.9 MG/250ML-% IV SOLN
INTRAVENOUS | Status: AC
  Filled 2024-02-23: qty 500

## 2024-02-23 MED ORDER — POLYETHYLENE GLYCOL 3350 17 G PO PACK: 17.0000 g | PACK | Freq: Every day | ORAL | Status: DC | PRN

## 2024-02-23 MED ORDER — PHENYLEPHRINE 80 MCG/ML (10ML) SYRINGE FOR IV PUSH (FOR BLOOD PRESSURE SUPPORT)
PREFILLED_SYRINGE | INTRAVENOUS | Status: AC
  Filled 2024-02-23: qty 10

## 2024-02-23 MED ORDER — ONDANSETRON HCL 4 MG/2ML IJ SOLN
INTRAMUSCULAR | Status: DC | PRN
  Administered 2024-02-23: 4 mg via INTRAVENOUS

## 2024-02-23 MED ORDER — PHENYLEPHRINE 80 MCG/ML (10ML) SYRINGE FOR IV PUSH (FOR BLOOD PRESSURE SUPPORT)
PREFILLED_SYRINGE | INTRAVENOUS | Status: DC | PRN
Start: 2024-02-23 — End: 2024-02-23
  Administered 2024-02-23 (×2): 80 ug via INTRAVENOUS
  Administered 2024-02-23 (×3): 160 ug via INTRAVENOUS
  Administered 2024-02-23: 240 ug via INTRAVENOUS

## 2024-02-23 MED ORDER — AMLODIPINE BESYLATE 5 MG PO TABS
5.0000 mg | ORAL_TABLET | Freq: Every day | ORAL | Status: DC
  Filled 2024-02-23: qty 1

## 2024-02-23 MED ORDER — KETOROLAC TROMETHAMINE 15 MG/ML IJ SOLN
7.5000 mg | Freq: Four times a day (QID) | INTRAMUSCULAR | Status: AC
  Administered 2024-02-23 – 2024-02-24 (×4): 7.5 mg via INTRAVENOUS
  Filled 2024-02-23 (×4): qty 1

## 2024-02-23 MED ORDER — PHENYLEPHRINE HCL-NACL 20-0.9 MG/250ML-% IV SOLN
INTRAVENOUS | Status: DC | PRN
  Administered 2024-02-23: 75 ug/min via INTRAVENOUS

## 2024-02-23 MED ORDER — ASPIRIN 81 MG PO TBEC: 81.0000 mg | DELAYED_RELEASE_TABLET | Freq: Two times a day (BID) | ORAL | Status: AC

## 2024-02-23 MED ORDER — DIPHENHYDRAMINE HCL 12.5 MG/5ML PO ELIX: 12.5000 mg | ORAL_SOLUTION | ORAL | Status: DC | PRN

## 2024-02-23 MED ORDER — ONDANSETRON HCL 4 MG PO TABS: 4.0000 mg | ORAL_TABLET | Freq: Three times a day (TID) | ORAL | 0 refills | Status: DC | PRN

## 2024-02-23 MED ORDER — DOCUSATE SODIUM 100 MG PO CAPS
100.0000 mg | ORAL_CAPSULE | Freq: Two times a day (BID) | ORAL | Status: DC
  Administered 2024-02-23 (×2): 100 mg via ORAL
  Filled 2024-02-23 (×3): qty 1

## 2024-02-23 MED ORDER — EPHEDRINE 5 MG/ML INJ
INTRAVENOUS | Status: AC
  Filled 2024-02-23: qty 5

## 2024-02-23 MED ORDER — PROPOFOL 10 MG/ML IV BOLUS
INTRAVENOUS | Status: DC | PRN
  Administered 2024-02-23: 100 mg via INTRAVENOUS

## 2024-02-23 MED ORDER — LIDOCAINE HCL (PF) 2 % IJ SOLN
INTRAMUSCULAR | Status: AC
  Filled 2024-02-23: qty 5

## 2024-02-23 MED ORDER — OXYCODONE HCL 5 MG/5ML PO SOLN: 5.0000 mg | Freq: Once | ORAL | Status: DC | PRN

## 2024-02-23 MED ORDER — OMEPRAZOLE 40 MG PO CPDR: 40.0000 mg | DELAYED_RELEASE_CAPSULE | Freq: Every day | ORAL | 0 refills | Status: AC

## 2024-02-23 MED ORDER — ACETAMINOPHEN 500 MG PO TABS
1000.0000 mg | ORAL_TABLET | Freq: Four times a day (QID) | ORAL | Status: AC
  Administered 2024-02-23 – 2024-02-24 (×4): 1000 mg via ORAL
  Filled 2024-02-23 (×5): qty 2

## 2024-02-23 MED ORDER — DEXAMETHASONE SODIUM PHOSPHATE 10 MG/ML IJ SOLN
INTRAMUSCULAR | Status: DC | PRN
  Administered 2024-02-23: 10 mg via INTRAVENOUS

## 2024-02-23 MED ORDER — POVIDONE-IODINE 10 % EX SWAB: 2.0000 | Freq: Once | CUTANEOUS | Status: DC

## 2024-02-23 MED ORDER — SODIUM CHLORIDE 0.9 % IR SOLN
Status: DC | PRN
  Administered 2024-02-23: 3000 mL

## 2024-02-23 MED ORDER — TRANEXAMIC ACID-NACL 1000-0.7 MG/100ML-% IV SOLN
1000.0000 mg | INTRAVENOUS | Status: AC
  Administered 2024-02-23: 1000 mg via INTRAVENOUS
  Filled 2024-02-23: qty 100

## 2024-02-23 MED ORDER — ORAL CARE MOUTH RINSE: 15.0000 mL | Freq: Once | OROMUCOSAL | Status: AC

## 2024-02-23 MED ORDER — METHOCARBAMOL 1000 MG/10ML IJ SOLN: 500.0000 mg | Freq: Four times a day (QID) | INTRAMUSCULAR | Status: DC | PRN

## 2024-02-23 MED ORDER — ISOPROPYL ALCOHOL 70 % SOLN
Status: DC | PRN
  Administered 2024-02-23: 1 via TOPICAL

## 2024-02-23 MED ORDER — ONDANSETRON HCL 4 MG/2ML IJ SOLN: 4.0000 mg | Freq: Four times a day (QID) | INTRAMUSCULAR | Status: DC | PRN

## 2024-02-23 MED ORDER — ASPIRIN 81 MG PO CHEW
81.0000 mg | CHEWABLE_TABLET | Freq: Two times a day (BID) | ORAL | Status: DC
  Administered 2024-02-23: 81 mg via ORAL
  Filled 2024-02-23 (×2): qty 1

## 2024-02-23 MED ORDER — OXYCODONE HCL 5 MG PO TABS: 5.0000 mg | ORAL_TABLET | ORAL | 0 refills | Status: DC | PRN

## 2024-02-23 MED ORDER — OXYCODONE HCL 5 MG PO TABS: 5.0000 mg | ORAL_TABLET | Freq: Once | ORAL | Status: DC | PRN

## 2024-02-23 MED ORDER — MENTHOL 3 MG MT LOZG: 1.0000 | LOZENGE | OROMUCOSAL | Status: DC | PRN

## 2024-02-23 MED ORDER — SUGAMMADEX SODIUM 200 MG/2ML IV SOLN
INTRAVENOUS | Status: DC | PRN
Start: 2024-02-23 — End: 2024-02-23
  Administered 2024-02-23 (×3): 50 mg via INTRAVENOUS

## 2024-02-23 MED ORDER — BUPIVACAINE-EPINEPHRINE (PF) 0.25% -1:200000 IJ SOLN
INTRAMUSCULAR | Status: AC
  Filled 2024-02-23: qty 30

## 2024-02-23 MED ORDER — ONDANSETRON HCL 4 MG PO TABS: 4.0000 mg | ORAL_TABLET | Freq: Three times a day (TID) | ORAL | Status: AC | PRN

## 2024-02-23 MED ORDER — LIDOCAINE HCL (CARDIAC) PF 100 MG/5ML IV SOSY
PREFILLED_SYRINGE | INTRAVENOUS | Status: DC | PRN
  Administered 2024-02-23: 80 mg via INTRAVENOUS

## 2024-02-23 MED ORDER — HYDROMORPHONE HCL 1 MG/ML IJ SOLN
INTRAMUSCULAR | Status: DC | PRN
  Administered 2024-02-23: .4 mg via INTRAVENOUS
  Administered 2024-02-23: .2 mg via INTRAVENOUS
  Administered 2024-02-23 (×2): .4 mg via INTRAVENOUS

## 2024-02-23 MED ORDER — POLYETHYLENE GLYCOL 3350 17 G PO PACK: 17.0000 g | PACK | Freq: Every day | ORAL | 0 refills | Status: AC

## 2024-02-23 MED ORDER — EPHEDRINE SULFATE-NACL 50-0.9 MG/10ML-% IV SOSY
PREFILLED_SYRINGE | INTRAVENOUS | Status: DC | PRN
Start: 2024-02-23 — End: 2024-02-23
  Administered 2024-02-23: 10 mg via INTRAVENOUS

## 2024-02-23 MED ORDER — CHLORHEXIDINE GLUCONATE 0.12 % MT SOLN
15.0000 mL | Freq: Once | OROMUCOSAL | Status: AC
  Administered 2024-02-23: 15 mL via OROMUCOSAL

## 2024-02-23 MED ORDER — IRBESARTAN 75 MG PO TABS
37.5000 mg | ORAL_TABLET | Freq: Every day | ORAL | Status: DC
  Filled 2024-02-23: qty 1

## 2024-02-23 MED ORDER — PANTOPRAZOLE SODIUM 40 MG PO TBEC
40.0000 mg | DELAYED_RELEASE_TABLET | Freq: Every day | ORAL | Status: DC
  Administered 2024-02-23 – 2024-02-24 (×2): 40 mg via ORAL
  Filled 2024-02-23 (×2): qty 1

## 2024-02-23 MED ORDER — ACETAMINOPHEN 500 MG PO TABS: 1000.0000 mg | ORAL_TABLET | Freq: Three times a day (TID) | ORAL | Status: AC | PRN

## 2024-02-23 MED ORDER — ACETAMINOPHEN 10 MG/ML IV SOLN
1000.0000 mg | Freq: Once | INTRAVENOUS | Status: DC | PRN
Start: 2024-02-23 — End: 2024-02-23

## 2024-02-23 MED ORDER — ONDANSETRON HCL 4 MG/2ML IJ SOLN
INTRAMUSCULAR | Status: AC
  Filled 2024-02-23: qty 2

## 2024-02-23 MED ORDER — ROCURONIUM BROMIDE 10 MG/ML (PF) SYRINGE
PREFILLED_SYRINGE | INTRAVENOUS | Status: AC
  Filled 2024-02-23: qty 10

## 2024-02-23 MED ORDER — FENTANYL CITRATE (PF) 100 MCG/2ML IJ SOLN
INTRAMUSCULAR | Status: AC
  Filled 2024-02-23: qty 2

## 2024-02-23 MED ORDER — DROPERIDOL 2.5 MG/ML IJ SOLN: 0.6250 mg | Freq: Once | INTRAMUSCULAR | Status: DC | PRN

## 2024-02-23 MED ORDER — CEFAZOLIN SODIUM-DEXTROSE 2-4 GM/100ML-% IV SOLN
2.0000 g | Freq: Four times a day (QID) | INTRAVENOUS | Status: AC
  Administered 2024-02-23 (×2): 2 g via INTRAVENOUS
  Filled 2024-02-23 (×2): qty 100

## 2024-02-23 MED ORDER — BUPIVACAINE-EPINEPHRINE (PF) 0.25% -1:200000 IJ SOLN
INTRAMUSCULAR | Status: DC | PRN
  Administered 2024-02-23: 80 mL

## 2024-02-23 MED ORDER — ROCURONIUM BROMIDE 100 MG/10ML IV SOLN
INTRAVENOUS | Status: DC | PRN
  Administered 2024-02-23: 60 mg via INTRAVENOUS

## 2024-02-23 MED ORDER — ROPIVACAINE HCL 5 MG/ML IJ SOLN
INTRAMUSCULAR | Status: DC | PRN
  Administered 2024-02-23: 20 mL via PERINEURAL

## 2024-02-23 MED ORDER — METHOCARBAMOL 500 MG PO TABS
500.0000 mg | ORAL_TABLET | Freq: Four times a day (QID) | ORAL | Status: DC | PRN
  Administered 2024-02-23 – 2024-02-24 (×2): 500 mg via ORAL
  Filled 2024-02-23 (×2): qty 1

## 2024-02-23 MED ORDER — PHENOL 1.4 % MT LIQD: 1.0000 | OROMUCOSAL | Status: DC | PRN

## 2024-02-23 MED ORDER — BUPIVACAINE LIPOSOME 1.3 % IJ SUSP: 20.0000 mL | Freq: Once | INTRAMUSCULAR | Status: DC

## 2024-02-23 MED ORDER — 0.9 % SODIUM CHLORIDE (POUR BTL) OPTIME
TOPICAL | Status: DC | PRN
  Administered 2024-02-23: 1000 mL

## 2024-02-23 MED ORDER — SODIUM CHLORIDE 0.9 % IV SOLN: INTRAVENOUS | Status: DC

## 2024-02-23 MED ORDER — FENTANYL CITRATE PF 50 MCG/ML IJ SOSY: 25.0000 ug | PREFILLED_SYRINGE | INTRAMUSCULAR | Status: DC | PRN

## 2024-02-23 MED ORDER — BUPIVACAINE LIPOSOME 1.3 % IJ SUSP
INTRAMUSCULAR | Status: AC
Start: 2024-02-23 — End: 2024-02-23
  Filled 2024-02-23: qty 20

## 2024-02-23 MED ORDER — HYDROMORPHONE HCL 1 MG/ML IJ SOLN: 0.5000 mg | INTRAMUSCULAR | Status: DC | PRN

## 2024-02-23 SURGICAL SUPPLY — 49 items
BAG COUNTER SPONGE SURGICOUNT (BAG) IMPLANT
BLADE SAG 18X100X1.27 (BLADE) ×1 IMPLANT
BLADE SAW SAG 35X64 .89 (BLADE) ×1 IMPLANT
BLADE SAW SGTL 11.0X1.19X90.0M (BLADE) IMPLANT
BNDG COHESIVE 3X5 TAN ST LF (GAUZE/BANDAGES/DRESSINGS) ×1 IMPLANT
BNDG ELASTIC 6X10 VLCR STRL LF (GAUZE/BANDAGES/DRESSINGS) ×1 IMPLANT
BOWL SMART MIX CTS (DISPOSABLE) IMPLANT
CHLORAPREP W/TINT 26 (MISCELLANEOUS) ×2 IMPLANT
COMPONENT FEM KNEE STD PS 7 LT (Joint) IMPLANT
COMPONENT PATELLA 3 PEG 35 (Joint) IMPLANT
COMPONENT TIB KNEE PS 0D LT (Joint) IMPLANT
COVER SURGICAL LIGHT HANDLE (MISCELLANEOUS) ×1 IMPLANT
CUFF TRNQT CYL 34X4.125X (TOURNIQUET CUFF) ×1 IMPLANT
DERMABOND ADVANCED .7 DNX12 (GAUZE/BANDAGES/DRESSINGS) ×1 IMPLANT
DRAPE INCISE IOBAN 85X60 (DRAPES) ×1 IMPLANT
DRAPE SHEET LG 3/4 BI-LAMINATE (DRAPES) ×1 IMPLANT
DRAPE U-SHAPE 47X51 STRL (DRAPES) ×1 IMPLANT
DRSG AQUACEL AG ADV 3.5X10 (GAUZE/BANDAGES/DRESSINGS) ×1 IMPLANT
ELECT REM PT RETURN 15FT ADLT (MISCELLANEOUS) ×1 IMPLANT
GAUZE SPONGE 4X4 12PLY STRL (GAUZE/BANDAGES/DRESSINGS) ×1 IMPLANT
GLOVE BIO SURGEON STRL SZ 6.5 (GLOVE) ×2 IMPLANT
GLOVE BIOGEL PI IND STRL 6.5 (GLOVE) ×1 IMPLANT
GLOVE BIOGEL PI IND STRL 8 (GLOVE) ×1 IMPLANT
GLOVE SURG ORTHO 8.0 STRL STRW (GLOVE) ×2 IMPLANT
GOWN STRL REUS W/ TWL XL LVL3 (GOWN DISPOSABLE) ×2 IMPLANT
HOLDER FOLEY CATH W/STRAP (MISCELLANEOUS) ×1 IMPLANT
HOOD PEEL AWAY T7 (MISCELLANEOUS) ×3 IMPLANT
KIT TURNOVER KIT A (KITS) ×1 IMPLANT
MANIFOLD NEPTUNE II (INSTRUMENTS) ×1 IMPLANT
MARKER SKIN DUAL TIP RULER LAB (MISCELLANEOUS) ×1 IMPLANT
NS IRRIG 1000ML POUR BTL (IV SOLUTION) ×1 IMPLANT
PACK TOTAL KNEE CUSTOM (KITS) ×1 IMPLANT
PENCIL SMOKE EVACUATOR (MISCELLANEOUS) ×1 IMPLANT
PIN DRILL HDLS TROCAR 75 4PK (PIN) IMPLANT
SCREW HEADED 33MM KNEE (MISCELLANEOUS) IMPLANT
SET HNDPC FAN SPRY TIP SCT (DISPOSABLE) ×1 IMPLANT
SOLUTION IRRIG SURGIPHOR (IV SOLUTION) IMPLANT
SOLUTION PRONTOSAN WOUND 350ML (IRRIGATION / IRRIGATOR) IMPLANT
STEM TIBIAL SZ6-7 E-F10 (Stem) IMPLANT
STRIP CLOSURE SKIN 1/2X4 (GAUZE/BANDAGES/DRESSINGS) ×1 IMPLANT
SUT MNCRL AB 3-0 PS2 18 (SUTURE) ×1 IMPLANT
SUT STRATAFIX 14 PDO 48 VLT (SUTURE) ×1 IMPLANT
SUT VIC AB 2-0 CT2 27 (SUTURE) ×2 IMPLANT
SUTURE STRATFX 0 PDS 27 VIOLET (SUTURE) ×1 IMPLANT
SYR 50ML LL SCALE MARK (SYRINGE) ×1 IMPLANT
TRAY FOLEY MTR SLVR 14FR STAT (SET/KITS/TRAYS/PACK) IMPLANT
TUBE SUCTION HIGH CAP CLEAR NV (SUCTIONS) ×1 IMPLANT
UNDERPAD 30X36 HEAVY ABSORB (UNDERPADS AND DIAPERS) ×1 IMPLANT
WRAP KNEE MAXI GEL POST OP (GAUZE/BANDAGES/DRESSINGS) ×1 IMPLANT

## 2024-02-23 NOTE — Discharge Instructions (Signed)
 INSTRUCTIONS AFTER JOINT REPLACEMENT   Remove items at home which could result in a fall. This includes throw rugs or furniture in walking pathways ICE to the affected joint every three hours while awake for 30 minutes at a time, for at least the first 3-5 days, and then as needed for pain and swelling.  Continue to use ice for pain and swelling. You may notice swelling that will progress down to the foot and ankle.  This is normal after surgery.  Elevate your leg when you are not up walking on it.   Continue to use the breathing machine you got in the hospital (incentive spirometer) which will help keep your temperature down.  It is common for your temperature to cycle up and down following surgery, especially at night when you are not up moving around and exerting yourself.  The breathing machine keeps your lungs expanded and your temperature down.  DIET:  As you were doing prior to hospitalization, we recommend a well-balanced diet.  DRESSING / WOUND CARE / SHOWERING:  Keep the surgical dressing until follow up.  The dressing is water  proof, so you can shower without any extra covering.  IF THE DRESSING FALLS OFF or the wound gets wet inside, change the dressing with sterile gauze.  Please use good hand washing techniques before changing the dressing.  Do not use any lotions or creams on the incision until instructed by your surgeon.    ACTIVITY  Increase activity slowly as tolerated, but follow the weight bearing instructions below.   No driving for 6 weeks or until further direction given by your physician.  You cannot drive while taking narcotics.  No lifting or carrying greater than 10 lbs. until further directed by your surgeon. Avoid periods of inactivity such as sitting longer than an hour when not asleep. This helps prevent blood clots.  You may return to work once you are authorized by your doctor.   WEIGHT BEARING: Weight bearing as tolerated with assist device (walker, cane, etc) as  directed, use it as long as suggested by your surgeon or therapist, typically at least 4-6 weeks.  EXERCISES  Results after joint replacement surgery are often greatly improved when you follow the exercise, range of motion and muscle strengthening exercises prescribed by your doctor. Safety measures are also important to protect the joint from further injury. Any time any of these exercises cause you to have increased pain or swelling, decrease what you are doing until you are comfortable again and then slowly increase them. If you have problems or questions, call your caregiver or physical therapist for advice.   Rehabilitation is important following a joint replacement. After just a few days of immobilization, the muscles of the leg can become weakened and shrink (atrophy).  These exercises are designed to build up the tone and strength of the thigh and leg muscles and to improve motion. Often times heat used for twenty to thirty minutes before working out will loosen up your tissues and help with improving the range of motion but do not use heat for the first two weeks following surgery (sometimes heat can increase post-operative swelling).   These exercises can be done on a training (exercise) mat, on the floor, on a table or on a bed. Use whatever works the best and is most comfortable for you.    Use music or television while you are exercising so that the exercises are a pleasant break in your day. This will make your life  better with the exercises acting as a break in your routine that you can look forward to.   Perform all exercises about fifteen times, three times per day or as directed.  You should exercise both the operative leg and the other leg as well.  Exercises include:   Quad Sets - Tighten up the muscle on the front of the thigh (Quad) and hold for 5-10 seconds.   Straight Leg Raises - With your knee straight (if you were given a brace, keep it on), lift the leg to 60 degrees, hold  for 3 seconds, and slowly lower the leg.  Perform this exercise against resistance later as your leg gets stronger.  Leg Slides: Lying on your back, slowly slide your foot toward your buttocks, bending your knee up off the floor (only go as far as is comfortable). Then slowly slide your foot back down until your leg is flat on the floor again.  Angel Wings: Lying on your back spread your legs to the side as far apart as you can without causing discomfort.  Hamstring Strength:  Lying on your back, push your heel against the floor with your leg straight by tightening up the muscles of your buttocks.  Repeat, but this time bend your knee to a comfortable angle, and push your heel against the floor.  You may put a pillow under the heel to make it more comfortable if necessary.   A rehabilitation program following joint replacement surgery can speed recovery and prevent re-injury in the future due to weakened muscles. Contact your doctor or a physical therapist for more information on knee rehabilitation.   CONSTIPATION:  Constipation is defined medically as fewer than three stools per week and severe constipation as less than one stool per week.  Even if you have a regular bowel pattern at home, your normal regimen is likely to be disrupted due to multiple reasons following surgery.  Combination of anesthesia, postoperative narcotics, change in appetite and fluid intake all can affect your bowels.   YOU MUST use at least one of the following options; they are listed in order of increasing strength to get the job done.  They are all available over the counter, and you may need to use some, POSSIBLY even all of these options:    Drink plenty of fluids (prune juice may be helpful) and high fiber foods Colace 100 mg by mouth twice a day  Senokot for constipation as directed and as needed Dulcolax (bisacodyl), take with full glass of water   Miralax  (polyethylene glycol) once or twice a day as needed.  If you  have tried all these things and are unable to have a bowel movement in the first 3-4 days after surgery call either your surgeon or your primary doctor.    If you experience loose stools or diarrhea, hold the medications until you stool forms back up.  If your symptoms do not get better within 1 week or if they get worse, check with your doctor.  If you experience the worst abdominal pain ever or develop nausea or vomiting, please contact the office immediately for further recommendations for treatment.  ITCHING:  If you experience itching with your medications, try taking only a single pain pill, or even half a pain pill at a time.  You can also use Benadryl  over the counter for itching or also to help with sleep.   TED HOSE STOCKINGS:  Use stockings on both legs until for at least 2 weeks or  as directed by physician office. They may be removed at night for sleeping.  MEDICATIONS:  See your medication summary on the "After Visit Summary" that nursing will review with you.  You may have some home medications which will be placed on hold until you complete the course of blood thinner medication.  It is important for you to complete the blood thinner medication as prescribed.  Blood clot prevention (DVT Prophylaxis): After surgery you are at an increased risk for a blood clot.  You were prescribed a blood thinner, Aspirin  81mg , to be taken twice daily for a total of 4 weeks from surgery to help reduce your risk of getting a blood clot.  You will also resume your Plavix  on your second day after surgery (Friday) and take as normally prescribed.  Signs of a pulmonary embolus (blood clot in the lungs) include sudden short of breath, feeling lightheaded or dizzy, chest pain with a deep breath, rapid pulse rapid breathing.  Signs of a blood clot in your arms or legs include new unexplained swelling and cramping, warm, red or darkened skin around the painful area.  Please call the office or 911 right away if  these signs or symptoms develop.  PRECAUTIONS:   If you experience chest pain or shortness of breath - call 911 immediately for transfer to the hospital emergency department.   If you develop a fever greater that 101 F, purulent drainage from wound, increased redness or drainage from wound, foul odor from the wound/dressing, or calf pain - CONTACT YOUR SURGEON.                                                   FOLLOW-UP APPOINTMENTS:  If you do not already have a post-op appointment, please call the office for an appointment to be seen by your surgeon.  Guidelines for how soon to be seen are listed in your "After Visit Summary", but are typically between 2-3 weeks after surgery.  If you have a specialized bandage, you may be told to follow up 1 week after surgery.  OTHER INSTRUCTIONS:  Knee Replacement:  Do not place pillow under knee, focus on keeping the knee straight while resting.  Place foam block, curve side up under heel at all times except when walking.  DO NOT modify, tear, cut, or change the foam block in any way.  POST-OPERATIVE OPIOID TAPER INSTRUCTIONS: It is important to wean off of your opioid medication as soon as possible. If you do not need pain medication after your surgery it is ok to stop day one. Opioids include: Codeine, Hydrocodone (Norco, Vicodin), Oxycodone (Percocet, oxycontin ) and hydromorphone  amongst others.  Long term and even short term use of opiods can cause: Increased pain response Dependence Constipation Depression Respiratory depression And more.  Withdrawal symptoms can include Flu like symptoms Nausea, vomiting And more Techniques to manage these symptoms Hydrate well Eat regular healthy meals Stay active Use relaxation techniques(deep breathing, meditating, yoga) Do Not substitute Alcohol  to help with tapering If you have been on opioids for less than two weeks and do not have pain than it is ok to stop all together.  Plan to wean off of  opioids This plan should start within one week post op of your joint replacement. Maintain the same interval or time between taking each dose and first decrease the dose.  Cut the total daily intake of opioids by one tablet each day Next start to increase the time between doses. The last dose that should be eliminated is the evening dose.   MAKE SURE YOU:  Understand these instructions.  Get help right away if you are not doing well or get worse.    Thank you for letting us  be a part of your medical care team.  It is a privilege we respect greatly.  We hope these instructions will help you stay on track for a fast and full recovery!

## 2024-02-23 NOTE — Evaluation (Signed)
 Physical Therapy Evaluation Patient Details Name: Jerome Shaffer MRN: [id number] DOB: 1955/12/18 Today's Date: 02/23/2024  History of Present Illness  68 yo male s/p L TKA on 02/23/24.  PMH: bil  THA, TIA, HTN  Clinical Impression  Pt is s/p TKA resulting in the deficits listed below (see PT Problem List).  Pt doing very well this afternoon, pain controlled and motivated to work with PT. Pt amb 100' with RW and CGA for safety. HEP initiated, placed in Bonefoam EOS and spouse ro remove in ~ 10 minutes. Anticipate steady progress in acute and post acute settings.  Pt will benefit from acute skilled PT to increase their independence and safety with mobility to allow discharge.           If plan is discharge home, recommend the following: A little help with walking and/or transfers;A little help with bathing/dressing/bathroom;Assist for transportation;Help with stairs or ramp for entrance   Can travel by private vehicle        Equipment Recommendations None recommended by PT  Recommendations for Other Services       Functional Status Assessment Patient has had a recent decline in their functional status and demonstrates the ability to make significant improvements in function in a reasonable and predictable amount of time.     Precautions / Restrictions Precautions Precautions: Fall;Knee Recall of Precautions/Restrictions: Intact Restrictions Weight Bearing Restrictions Per Provider Order: No LLE Weight Bearing Per Provider Order: Weight bearing as tolerated      Mobility  Bed Mobility Overal bed mobility: Needs Assistance Bed Mobility: Supine to Sit     Supine to sit: Supervision     General bed mobility comments: for safety    Transfers Overall transfer level: Needs assistance Equipment used: Rolling walker (2 wheels) Transfers: Sit to/from Stand Sit to Stand: Contact guard assist           General transfer comment: cues for proper hand placement and LLE  position    Ambulation/Gait Ambulation/Gait assistance: Contact guard assist Gait Distance (Feet): 100 Feet Assistive device: Rolling walker (2 wheels) Gait Pattern/deviations: Step-to pattern, Step-through pattern       General Gait Details: cues for initial sequence and RW position, progression to step through pattern end of distance without incr pain  Stairs            Wheelchair Mobility     Tilt Bed    Modified Rankin (Stroke Patients Only)       Balance Overall balance assessment: No apparent balance deficits (not formally assessed)                                           Pertinent Vitals/Pain Pain Assessment Pain Assessment: Faces Faces Pain Scale: Hurts a little bit Pain Location: L knee Pain Descriptors / Indicators: Discomfort Pain Intervention(s): Premedicated before session, Monitored during session, Limited activity within patient's tolerance, Ice applied    Home Living Family/patient expects to be discharged to:: Private residence Living Arrangements: Spouse/significant other Available Help at Discharge: Family;Available PRN/intermittently Type of Home: House Home Access: Stairs to enter Entrance Stairs-Rails: Left Entrance Stairs-Number of Steps: 5 Alternate Level Stairs-Number of Steps: 15 Home Layout: Two level;Bed/bath upstairs Home Equipment: Rolling Walker (2 wheels);BSC/3in1 Additional Comments: plans to stay on main level for a few days; plays pickleball, very active    Prior Function Prior Level of Function : Independent/Modified Independent  Mobility Comments: ind ADLs Comments: ind     Extremity/Trunk Assessment   Upper Extremity Assessment Upper Extremity Assessment: Overall WFL for tasks assessed    Lower Extremity Assessment Lower Extremity Assessment: LLE deficits/detail LLE Deficits / Details: ankle WFL, knee extension and hip flexion at least 3/5       Communication    Communication Communication: No apparent difficulties    Cognition Arousal: Alert Behavior During Therapy: WFL for tasks assessed/performed   PT - Cognitive impairments: No apparent impairments                         Following commands: Intact       Cueing Cueing Techniques: Verbal cues     General Comments      Exercises Total Joint Exercises Ankle Circles/Pumps: AROM, Both, 5 reps Quad Sets: 5 reps, Both, AROM   Assessment/Plan    PT Assessment Patient needs continued PT services  PT Problem List Decreased range of motion;Decreased activity tolerance;Decreased balance;Decreased knowledge of use of DME;Decreased mobility;Decreased knowledge of precautions       PT Treatment Interventions DME instruction;Therapeutic exercise;Gait training;Functional mobility training;Therapeutic activities;Patient/family education;Stair training    PT Goals (Current goals can be found in the Care Plan section)  Acute Rehab PT Goals Patient Stated Goal: get back to playing pickleball and spending time with grandkids PT Goal Formulation: With patient Time For Goal Achievement: 03/01/24 Potential to Achieve Goals: Good    Frequency 7X/week     Co-evaluation               AM-PAC PT 6 Clicks Mobility  Outcome Measure Help needed turning from your back to your side while in a flat bed without using bedrails?: A Little Help needed moving from lying on your back to sitting on the side of a flat bed without using bedrails?: A Little Help needed moving to and from a bed to a chair (including a wheelchair)?: A Little Help needed standing up from a chair using your arms (e.g., wheelchair or bedside chair)?: A Little Help needed to walk in hospital room?: A Little Help needed climbing 3-5 steps with a railing? : A Little 6 Click Score: 18    End of Session Equipment Utilized During Treatment: Gait belt Activity Tolerance: Patient tolerated treatment well Patient left:  in chair;with chair alarm set;with family/visitor present Nurse Communication: Mobility status PT Visit Diagnosis: Other abnormalities of gait and mobility (R26.89)    Time: 8447-8386 PT Time Calculation (min) (ACUTE ONLY): 21 min   Charges:   PT Evaluation $PT Eval Low Complexity: 1 Low   PT General Charges $$ ACUTE PT VISIT: 1 Visit         Rickia Freeburg, PT  Acute Rehab Dept ([facility]) [phone]  02/23/2024   [facility] 02/23/2024, 4:25 PM

## 2024-02-23 NOTE — Anesthesia Procedure Notes (Signed)
 Procedure Name: Intubation Date/Time: 02/23/2024 8:55 AM  Performed by: Laverda Burnard LABOR, CRNAPre-anesthesia Checklist: Patient identified, Emergency Drugs available, Suction available and Patient being monitored Patient Re-evaluated:Patient Re-evaluated prior to induction Oxygen Delivery Method: Circle system utilized Preoxygenation: Pre-oxygenation with 100% oxygen Induction Type: IV induction Ventilation: Mask ventilation without difficulty Laryngoscope Size: Miller and 3 Grade View: Grade III Tube type: Oral Number of attempts: 1 Airway Equipment and Method: Stylet and Oral airway Placement Confirmation: ETT inserted through vocal cords under direct vision, positive ETCO2 and breath sounds checked- equal and bilateral Secured at: 21 cm Tube secured with: Tape Dental Injury: Teeth and Oropharynx as per pre-operative assessment

## 2024-02-23 NOTE — Anesthesia Procedure Notes (Signed)
 Anesthesia Regional Block: Adductor canal block   Pre-Anesthetic Checklist: , timeout performed,  Correct Patient, Correct Site, Correct Laterality,  Correct Procedure, Correct Position, site marked,  Risks and benefits discussed,  Surgical consent,  Pre-op evaluation,  At surgeon's request and post-op pain management  Laterality: Left  Prep: chloraprep       Needles:  Injection technique: Single-shot  Needle Type: Echogenic Stimulator Needle     Needle Length: 9cm  Needle Gauge: 21     Additional Needles:   Procedures:,,,, ultrasound used (permanent image in chart),,    Narrative:  Start time: 02/23/2024 8:06 AM End time: 02/23/2024 8:12 AM Injection made incrementally with aspirations every 5 mL.  Performed by: Personally  Anesthesiologist: Erma Thom SAUNDERS, MD  Additional Notes: Discussed risks and benefits of the nerve block in detail, including but not limited vascular injury, permanent nerve damage and infection.   Patient tolerated the procedure well. Local anesthetic introduced in an incremental fashion under minimal resistance after negative aspirations. No paresthesias were elicited. After completion of the procedure, no acute issues were identified and patient continued to be monitored by RN.

## 2024-02-23 NOTE — Op Note (Signed)
 DATE OF SURGERY:  02/23/2024 TIME: 10:21 AM  PATIENT NAME:  Jerome Shaffer   AGE: 68 y.o.    PRE-OPERATIVE DIAGNOSIS: End-stage left knee osteoarthritis  POST-OPERATIVE DIAGNOSIS:  Same  PROCEDURE: Press-fit left total Knee Arthroplasty  SURGEON:  Gorden Stthomas A Aleane Wesenberg, MD   ASSISTANT: Bernarda Mclean, PA-C, present and scrubbed throughout the case, critical for assistance with exposure, retraction, instrumentation, and closure.   OPERATIVE IMPLANTS:  Press-fit Zimmer persona size 7 left CR PPS standard femur, size F left press-fit tibial baseplate, 35 mm press-fit [place]seo Ti patella, 10 mm MC poly insert Implant Name Type Inv. Item Serial No. Manufacturer Lot No. LRB No. Used Action  COMPONENT PATELLA 3 PEG 35 - [id number] Joint COMPONENT PATELLA 3 PEG 35  [employer](ORTH,TRAU,BIO,SG) [id number] Left 1 Implanted  COMPONENT FEM KNEE STD PS 7 LT - [id number] Joint COMPONENT FEM KNEE STD PS 7 LT  [employer](ORTH,TRAU,BIO,SG) [id number] Left 1 Implanted  COMPONENT TIB KNEE PS 0D LT - [id number] Joint COMPONENT TIB KNEE PS 0D LT  [employer](ORTH,TRAU,BIO,SG) [id number] Left 1 Implanted  STEM TIBIAL SZ6-7 E-F10 - [id number] Stem STEM TIBIAL SZ6-7 E-F10  [employer](ORTH,TRAU,BIO,SG) [id number] Left 1 Implanted      PREOPERATIVE INDICATIONS:  Jerome Shaffer is a 68 y.o. year old male with end stage bone on bone degenerative arthritis of the knee who failed conservative treatment, including injections, antiinflammatories, activity modification, and assistive devices, and had significant impairment of their activities of daily living, and elected for Total Knee Arthroplasty.   The risks, benefits, and alternatives were discussed at length including but not limited to the risks of infection, bleeding, nerve injury, stiffness, blood clots, the need for revision surgery, cardiopulmonary complications, among others, and they were willing to proceed.  ESTIMATED BLOOD LOSS: 50cc  OPERATIVE  DESCRIPTION:   Once adequate anesthesia was induced, preoperative antibiotics, 2 gm of ancef ,1 gm of Tranexamic Acid , and 8 mg of Decadron  administered, the patient was positioned supine with a left thigh tourniquet placed.  The left lower extremity was prepped and draped in sterile fashion.  A time-  out was performed identifying the patient, planned procedure, and the appropriate extremity.     The leg was  exsanguinated, tourniquet elevated to 250 mmHg.  A midline incision was  made followed by median parapatellar arthrotomy. Anterior horn of the medial meniscus was released and resected. A medial release was performed, the infrapatellar fat pad was resected with care taken to protect the patellar tendon. The suprapatellar fat was removed to exposed the distal anterior femur. The anterior horn of the lateral meniscus and ACL were released.    Following initial  exposure, I first started with the femur  The femoral  canal was opened with a drill, canal was suctioned to try to prevent fat emboli.  An  intramedullary rod was passed set at 5 degrees valgus, 10 mm. The distal femur was resected.  Following this resection, the tibia was  subluxated anteriorly.  Using the extramedullary guide, 10 mm of bone was resected off   the proximal lateral tibia.  Initially felt that both the flexion extension gaps appeared tight so elected to resect additional 2 mm off the proximal tibia.  We confirmed the gap would be  stable medially and laterally with a size 10mm spacer block as well as confirmed that the tibial cut was perpendicular in the coronal plane, checking with an alignment rod.    Once this was done, the posterior femoral referencing femoral sizer  was placed under to the posterior condyles with 5 degrees of external rotational which was parallel to the transepicondylar axis and perpendicular to [employer]. The femur was sized to be a size 7 in the anterior-  posterior dimension. The  anterior,  posterior, and  chamfer cuts were made without difficulty nor   notching making certain that I was along the anterior cortex to help  with flexion gap stability. Next a laminar spreader was placed with the knee in flexion and the medial lateral menisci were resected.  5 cc of the Exparel  mixture was injected in the medial side of the back of the knee and 3 cc in the lateral side.  1/2 inch curved osteotome was used to resect posterior osteophyte that was then removed with a pituitary rongeur.       At this point, the tibia was sized to be a size F.  The size F tray was  then pinned in position. Trial reduction was now carried with a 7 femur, F tibia, a 10mm MC insert.  The knee had full extension and was stable to varus valgus stress in extension.  The knee was slightly tight in flexion and the PCL was partially released.   Attention was next directed to the patella.  Precut  measurement was noted to be 26 mm.  I resected down to 16 mm and used a  35mm patellar button to restore patellar height as well as cover the cut surface.     The patella lug holes were drilled and a 35mm patella poly trial was placed.    The knee was brought to full extension with good flexion stability with the patella tracking through the trochlea without application of pressure.    Next the femoral component was again assessed and determined to be seated and appropriately lateralized.  The femoral lug holes were drilled.  The femoral component was then removed. Tibial component was again assessed and felt to be seated and appropriately rotated with the medial third of the tubercle. The tibia was then drilled, and keel punched.     Final components were  opened and impacted into place.   The knee was irrigated with surgiphor irrigation. The synovial lining was  then injected a dilute Exparel  with 30cc of 0.25% marcaine  with epinephrine .     I confirmed that I was satisfied with the range of motion and stability, and the  final 10mm MC poly insert was chosen.  It was placed into the knee.         The tourniquet had been let down at 64 minutes.  No significant hemostasis was required.  The medial parapatellar arthrotomy was then reapproximated using  #1 Stratafix sutures with the knee  in flexion.  The remaining wound was closed with 0 stratafix, 2-0 Vicryl, and running 3-0 Monocryl. The knee was cleaned, dried, dressed sterilely using Dermabond and   Aquacel dressing.  The patient was then brought to recovery room in stable condition, tolerating the procedure  well. There were no complications.   Post op recs: WB: WBAT Abx: ancef  Imaging: PACU xrays DVT prophylaxis: Aspirin  81mg  BID x4 weeks, resume Plavix  postop day 2 Follow up: 2 weeks after surgery for a wound check with Dr. Edna at [facility].  Address: [address], [place], [place] [address]  Office Phone: [phone]  Toribio Edna, MD Orthopaedic Surgery

## 2024-02-23 NOTE — Transfer of Care (Signed)
 Immediate Anesthesia Transfer of Care Note  Patient: Jerome Shaffer  Procedure(s) Performed: ARTHROPLASTY, KNEE, TOTAL (Left: Knee)  Patient Location: PACU  Anesthesia Type:GA combined with regional for post-op pain  Level of Consciousness: awake and patient cooperative  Airway & Oxygen Therapy: Patient Spontanous Breathing and Patient connected to face mask oxygen  Post-op Assessment: Report given to RN and Post -op Vital signs reviewed and stable  Post vital signs: Reviewed and stable  Last Vitals:  Vitals Value Taken Time  BP 135/69 02/23/24 10:49  Temp    Pulse 86 02/23/24 10:51  Resp 10 02/23/24 10:51  SpO2 100 % 02/23/24 10:51  Vitals shown include unfiled device data.  Last Pain:  Vitals:   02/23/24 0701  TempSrc:   PainSc: 0-No pain         Complications: No notable events documented.

## 2024-02-23 NOTE — Interval H&P Note (Signed)

## 2024-02-23 NOTE — Anesthesia Postprocedure Evaluation (Signed)
 Anesthesia Post Note  Patient: Jerome Shaffer  Procedure(s) Performed: ARTHROPLASTY, KNEE, TOTAL (Left: Knee)     Patient location during evaluation: PACU Anesthesia Type: Regional and General Level of consciousness: awake and alert Pain management: pain level controlled Vital Signs Assessment: post-procedure vital signs reviewed and stable Respiratory status: spontaneous breathing, nonlabored ventilation, respiratory function stable and patient connected to nasal cannula oxygen Cardiovascular status: blood pressure returned to baseline and stable Postop Assessment: no apparent nausea or vomiting Anesthetic complications: no   No notable events documented.  Last Vitals:  Vitals:   02/23/24 1130 02/23/24 1145  BP: (!) 142/82 130/89  Pulse: 81 77  Resp: 12 16  Temp: 36.6 C 36.5 C  SpO2: 96% 99%    Last Pain:  Vitals:   02/23/24 1200  TempSrc:   PainSc: 5                  Thom JONELLE Peoples

## 2024-02-23 NOTE — Progress Notes (Signed)
 Orthopedic Tech Progress Note Patient Details:  Jerome Shaffer Jul 22, 1955 [id number]  Ortho Devices Type of Ortho Device: Bone foam zero knee Ortho Device/Splint Location: left Ortho Device/Splint Interventions: Ordered, Application, Adjustment   Post Interventions Patient Tolerated: Well Instructions Provided: Adjustment of device, Care of device  Waylan Thom Loving 02/23/2024, 10:53 AM

## 2024-02-24 DIAGNOSIS — Z96652 Presence of left artificial knee joint: Secondary | ICD-10-CM | POA: Diagnosis not present

## 2024-02-24 DIAGNOSIS — M1712 Unilateral primary osteoarthritis, left knee: Secondary | ICD-10-CM | POA: Diagnosis not present

## 2024-02-24 LAB — CBC
HCT: 35.6 % — ABNORMAL LOW (ref 39.0–52.0)
Hemoglobin: 11.8 g/dL — ABNORMAL LOW (ref 13.0–17.0)
MCH: 30.9 pg (ref 26.0–34.0)
MCHC: 33.1 g/dL (ref 30.0–36.0)
MCV: 93.2 fL (ref 80.0–100.0)
Platelets: 203 K/uL (ref 150–400)
RBC: 3.82 MIL/uL — ABNORMAL LOW (ref 4.22–5.81)
RDW: 13.9 % (ref 11.5–15.5)
WBC: 12.1 K/uL — ABNORMAL HIGH (ref 4.0–10.5)
nRBC: 0 % (ref 0.0–0.2)

## 2024-02-24 LAB — BASIC METABOLIC PANEL WITH GFR
Anion gap: 6 (ref 5–15)
BUN: 19 mg/dL (ref 8–23)
CO2: 23 mmol/L (ref 22–32)
Calcium: 9.2 mg/dL (ref 8.9–10.3)
Chloride: 106 mmol/L (ref 98–111)
Creatinine, Ser: 1.08 mg/dL (ref 0.61–1.24)
GFR, Estimated: >60 mL/min (ref >60)
Glucose, Bld: 149 mg/dL — ABNORMAL HIGH (ref 70–99)
Potassium: 4.4 mmol/L (ref 3.5–5.1)
Sodium: 135 mmol/L (ref 135–145)

## 2024-02-24 NOTE — TOC Transition Note (Signed)
 Transition of Care [facility]) - Discharge Note   Patient Details  Name: Jerome Shaffer MRN: [id number] Date of Birth: 12/13/1955  [facility] of [facility]) CM/SW Contact:  Alfonse JONELLE Rex, RN Phone Number: 02/24/2024, 9:40 AM   Clinical Narrative:   Met with patient at bedside to review dc therapy and home equipment needs, pt confirmed OPPT-SOS on [facility], has RW. MOON completed. No TOC needs.     Final next level of care: OP Rehab Barriers to Discharge: No Barriers Identified   Patient Goals and CMS Choice Patient states their goals for this hospitalization and ongoing recovery are:: return home          Discharge Placement                       Discharge Plan and Services Additional resources added to the After Visit Summary for                  DME Arranged: CPM DME Agency: [employer]                  Social Drivers of Health (SDOH) Interventions SDOH Screenings   Food Insecurity: No Food Insecurity (02/23/2024)  Housing: Low Risk  (02/23/2024)  Transportation Needs: No Transportation Needs (02/23/2024)  Utilities: Not At Risk (02/23/2024)  Social Connections: Socially Integrated (02/23/2024)  Tobacco Use: Low Risk  (02/23/2024)     Readmission Risk Interventions     No data to display

## 2024-02-24 NOTE — Discharge Summary (Signed)
 Physician Discharge Summary  Patient ID: Jerome Shaffer MRN: [id number] DOB/AGE: 69/29/1957 69 y.o.  Admit date: 02/23/2024 Discharge date: 02/24/2024  Admission Diagnoses:  Primary osteoarthritis of left knee  Discharge Diagnoses:  Principal Problem:   Primary osteoarthritis of left knee   Past Medical History:  Diagnosis Date   Arthritis    Heart murmur    Hypertension    Stroke [facility])    TIA (transient ischemic attack)     Surgeries: Procedure(s): ARTHROPLASTY, KNEE, TOTAL on 02/23/2024   Consultants (if any):   Discharged Condition: Improved  Hospital Course: GEDDY BOYDSTUN is an 68 y.o. male who was admitted 02/23/2024 with a diagnosis of Primary osteoarthritis of left knee and went to the operating room on 02/23/2024 and underwent the above named procedures.    He was given perioperative antibiotics:  Anti-infectives (From admission, onward)    Start     Dose/Rate Route Frequency Ordered Stop   02/23/24 1500  ceFAZolin  (ANCEF ) IVPB 2g/100 mL premix        2 g 200 mL/hr over 30 Minutes Intravenous Every 6 hours 02/23/24 1147 02/23/24 2230   02/23/24 0645  ceFAZolin  (ANCEF ) IVPB 2g/100 mL premix        2 g 200 mL/hr over 30 Minutes Intravenous On call to O.R. 02/23/24 9366 02/23/24 9081     .  He was given sequential compression devices, early ambulation, and aspirin  for DVT prophylaxis.  He benefited maximally from the hospital stay and there were no complications.    Recent vital signs:  Vitals:   02/24/24 0253 02/24/24 0630  BP: 119/74 136/75  Pulse: 71 70  Resp: 18 18  Temp: 98.3 F (36.8 C) 97.9 F (36.6 C)  SpO2: 96% 98%    Recent laboratory studies:  Lab Results  Component Value Date   HGB 11.8 (L) 02/24/2024   HGB 14.2 02/10/2024   HGB 8.2 (L) 06/16/2016   Lab Results  Component Value Date   WBC 12.1 (H) 02/24/2024   PLT 203 02/24/2024   No results found for: INR Lab Results  Component Value Date   NA 135 02/24/2024   K 4.4  02/24/2024   CL 106 02/24/2024   CO2 23 02/24/2024   BUN 19 02/24/2024   CREATININE 1.08 02/24/2024   GLUCOSE 149 (H) 02/24/2024    Discharge Medications:   Allergies as of 02/24/2024   No Known Allergies      Medication List     TAKE these medications    acetaminophen  500 MG tablet Commonly known as: TYLENOL  Take 2 tablets (1,000 mg total) by mouth every 8 (eight) hours as needed.   amLODipine  5 MG tablet Commonly known as: NORVASC  Take 5 mg by mouth daily.   amoxicillin 500 MG capsule Commonly known as: AMOXIL Take 2,000 mg by mouth See admin instructions. Take 2000 mg by mouth prior to dental appointment   aspirin  EC 81 MG tablet Take 1 tablet (81 mg total) by mouth 2 (two) times daily for 28 days. Swallow whole.   atorvastatin  10 MG tablet Commonly known as: LIPITOR Take 20 mg by mouth daily.   clopidogrel  75 MG tablet Commonly known as: PLAVIX  Take 1 tablet (75 mg total) by mouth daily. Start taking on: February 25, 2024   Flaxseed Oil 1000 MG Caps Take 1,000 mg by mouth daily.   methocarbamol  500 MG tablet Commonly known as: ROBAXIN  Take 1 tablet (500 mg total) by mouth every 8 (eight) hours as needed for  up to 10 days for muscle spasms.   multivitamin with minerals Tabs tablet Take 1 tablet by mouth daily. Multivitamin for 50+   olmesartan 5 MG tablet Commonly known as: BENICAR Take 5 mg by mouth daily.   omeprazole  40 MG capsule Commonly known as: PRILOSEC Take 1 capsule (40 mg total) by mouth daily for 21 days.   ondansetron  4 MG tablet Commonly known as: Zofran  Take 1 tablet (4 mg total) by mouth every 8 (eight) hours as needed for up to 14 days for nausea or vomiting.   OVER THE COUNTER MEDICATION Take 1 tablet by mouth daily. EZ Prostate 1152   oxyCODONE  5 MG immediate release tablet Commonly known as: Roxicodone  Take 1 tablet (5 mg total) by mouth every 4 (four) hours as needed for up to 7 days for severe pain (pain score 7-10) or  moderate pain (pain score 4-6).   polyethylene glycol 17 g packet Commonly known as: MiraLax  Take 17 g by mouth daily.   tadalafil 5 MG tablet Commonly known as: CIALIS Take 5 mg by mouth daily.   tamsulosin  0.4 MG Caps capsule Commonly known as: FLOMAX  Take 0.4 mg by mouth daily.   vitamin C  1000 MG tablet Take 1,000 mg by mouth daily.   Vitamin D3 125 MCG (5000 UT) Caps Take 5,000 Units by mouth daily.        Diagnostic Studies: DG Knee Left Port Result Date: 02/23/2024 CLINICAL DATA:  [id number] Post-operative state 252351 EXAM: PORTABLE LEFT KNEE - 1-2 VIEW COMPARISON:  October 22, 2022 FINDINGS: Well-aligned knee arthroplasty without acute fracture.No dislocation. Subcutaneous edema and gas present, an expected finding at this stage in the postoperative recovery. No radiopaque foreign body or retained surgical instrument. IMPRESSION: Well-aligned total knee arthroplasty without acute fracture or dislocation.No radiopaque foreign body or retained surgical instrument. Electronically Signed   By: Rogelia Myers M.D.   On: 02/23/2024 11:30   CUP PACEART REMOTE DEVICE CHECK Result Date: 02/04/2024 ILR summary report received. Battery status OK. Normal device function. No new symptom, tachy, brady, or pause episodes. No new AF episodes. Monthly summary reports and ROV/PRN AB, CVRS   Disposition: Discharge disposition: 01-Home or Self Care       Discharge Instructions     Call MD / Call 911   Complete by: As directed    If you experience chest pain or shortness of breath, CALL 911 and be transported to the hospital emergency room.  If you develope a fever above 101 F, pus (white drainage) or increased drainage or redness at the wound, or calf pain, call your surgeon's office.   Constipation Prevention   Complete by: As directed    Drink plenty of fluids.  Prune juice may be helpful.  You may use a stool softener, such as Colace (over the counter) 100 mg twice a day.  Use MiraLax   (over the counter) for constipation as needed.   Diet - low sodium heart healthy   Complete by: As directed    Increase activity slowly as tolerated   Complete by: As directed    Post-operative opioid taper instructions:   Complete by: As directed    POST-OPERATIVE OPIOID TAPER INSTRUCTIONS: It is important to wean off of your opioid medication as soon as possible. If you do not need pain medication after your surgery it is ok to stop day one. Opioids include: Codeine, Hydrocodone (Norco, Vicodin), Oxycodone (Percocet, oxycontin ) and hydromorphone  amongst others.  Long term and even short term use of opiods  can cause: Increased pain response Dependence Constipation Depression Respiratory depression And more.  Withdrawal symptoms can include Flu like symptoms Nausea, vomiting And more Techniques to manage these symptoms Hydrate well Eat regular healthy meals Stay active Use relaxation techniques(deep breathing, meditating, yoga) Do Not substitute Alcohol  to help with tapering If you have been on opioids for less than two weeks and do not have pain than it is ok to stop all together.  Plan to wean off of opioids This plan should start within one week post op of your joint replacement. Maintain the same interval or time between taking each dose and first decrease the dose.  Cut the total daily intake of opioids by one tablet each day Next start to increase the time between doses. The last dose that should be eliminated is the evening dose.           Follow-up Information     Edna Toribio LABOR, MD. Go on 03/09/2024.   Specialty: Orthopedic Surgery Why: Your appointment is scheduled for 2:45. Contact information: [address] [place] [address] [phone]         [facility], [place]. Go on 02/25/2024.   Why: Your outpatient physical therapy is scheduled for 11:00. Please arrive at 10:45 to complete your paperwork Contact  information: [facility] [address] [place] [address] [phone]                    Discharge Instructions      INSTRUCTIONS AFTER JOINT REPLACEMENT   Remove items at home which could result in a fall. This includes throw rugs or furniture in walking pathways ICE to the affected joint every three hours while awake for 30 minutes at a time, for at least the first 3-5 days, and then as needed for pain and swelling.  Continue to use ice for pain and swelling. You may notice swelling that will progress down to the foot and ankle.  This is normal after surgery.  Elevate your leg when you are not up walking on it.   Continue to use the breathing machine you got in the hospital (incentive spirometer) which will help keep your temperature down.  It is common for your temperature to cycle up and down following surgery, especially at night when you are not up moving around and exerting yourself.  The breathing machine keeps your lungs expanded and your temperature down.  DIET:  As you were doing prior to hospitalization, we recommend a well-balanced diet.  DRESSING / WOUND CARE / SHOWERING:  Keep the surgical dressing until follow up.  The dressing is water  proof, so you can shower without any extra covering.  IF THE DRESSING FALLS OFF or the wound gets wet inside, change the dressing with sterile gauze.  Please use good hand washing techniques before changing the dressing.  Do not use any lotions or creams on the incision until instructed by your surgeon.    ACTIVITY  Increase activity slowly as tolerated, but follow the weight bearing instructions below.   No driving for 6 weeks or until further direction given by your physician.  You cannot drive while taking narcotics.  No lifting or carrying greater than 10 lbs. until further directed by your surgeon. Avoid periods of inactivity such as sitting longer than an hour when not asleep. This helps prevent blood  clots.  You may return to work once you are authorized by your doctor.   WEIGHT BEARING:  Weight bearing as tolerated with assist device (walker, cane, etc) as directed, use it as long as suggested by your surgeon or therapist, typically at least 4-6 weeks.  EXERCISES  Results after joint replacement surgery are often greatly improved when you follow the exercise, range of motion and muscle strengthening exercises prescribed by your doctor. Safety measures are also important to protect the joint from further injury. Any time any of these exercises cause you to have increased pain or swelling, decrease what you are doing until you are comfortable again and then slowly increase them. If you have problems or questions, call your caregiver or physical therapist for advice.   Rehabilitation is important following a joint replacement. After just a few days of immobilization, the muscles of the leg can become weakened and shrink (atrophy).  These exercises are designed to build up the tone and strength of the thigh and leg muscles and to improve motion. Often times heat used for twenty to thirty minutes before working out will loosen up your tissues and help with improving the range of motion but do not use heat for the first two weeks following surgery (sometimes heat can increase post-operative swelling).   These exercises can be done on a training (exercise) mat, on the floor, on a table or on a bed. Use whatever works the best and is most comfortable for you.    Use music or television while you are exercising so that the exercises are a pleasant break in your day. This will make your life better with the exercises acting as a break in your routine that you can look forward to.   Perform all exercises about fifteen times, three times per day or as directed.  You should exercise both the operative leg and the other leg as well.  Exercises include:   Quad Sets - Tighten up the muscle on the front of the  thigh (Quad) and hold for 5-10 seconds.   Straight Leg Raises - With your knee straight (if you were given a brace, keep it on), lift the leg to 60 degrees, hold for 3 seconds, and slowly lower the leg.  Perform this exercise against resistance later as your leg gets stronger.  Leg Slides: Lying on your back, slowly slide your foot toward your buttocks, bending your knee up off the floor (only go as far as is comfortable). Then slowly slide your foot back down until your leg is flat on the floor again.  Angel Wings: Lying on your back spread your legs to the side as far apart as you can without causing discomfort.  Hamstring Strength:  Lying on your back, push your heel against the floor with your leg straight by tightening up the muscles of your buttocks.  Repeat, but this time bend your knee to a comfortable angle, and push your heel against the floor.  You may put a pillow under the heel to make it more comfortable if necessary.   A rehabilitation program following joint replacement surgery can speed recovery and prevent re-injury in the future due to weakened muscles. Contact your doctor or a physical therapist for more information on knee rehabilitation.   CONSTIPATION:  Constipation is defined medically as fewer than three stools per week and severe constipation as less than one stool per week.  Even if you have a regular bowel pattern at home, your normal regimen is likely to be disrupted due to multiple reasons following surgery.  Combination of anesthesia, postoperative narcotics, change in  appetite and fluid intake all can affect your bowels.   YOU MUST use at least one of the following options; they are listed in order of increasing strength to get the job done.  They are all available over the counter, and you may need to use some, POSSIBLY even all of these options:    Drink plenty of fluids (prune juice may be helpful) and high fiber foods Colace 100 mg by mouth twice a day  Senokot for  constipation as directed and as needed Dulcolax (bisacodyl), take with full glass of water   Miralax  (polyethylene glycol) once or twice a day as needed.  If you have tried all these things and are unable to have a bowel movement in the first 3-4 days after surgery call either your surgeon or your primary doctor.    If you experience loose stools or diarrhea, hold the medications until you stool forms back up.  If your symptoms do not get better within 1 week or if they get worse, check with your doctor.  If you experience the worst abdominal pain ever or develop nausea or vomiting, please contact the office immediately for further recommendations for treatment.  ITCHING:  If you experience itching with your medications, try taking only a single pain pill, or even half a pain pill at a time.  You can also use Benadryl  over the counter for itching or also to help with sleep.   TED HOSE STOCKINGS:  Use stockings on both legs until for at least 2 weeks or as directed by physician office. They may be removed at night for sleeping.  MEDICATIONS:  See your medication summary on the "After Visit Summary" that nursing will review with you.  You may have some home medications which will be placed on hold until you complete the course of blood thinner medication.  It is important for you to complete the blood thinner medication as prescribed.  Blood clot prevention (DVT Prophylaxis): After surgery you are at an increased risk for a blood clot.  You were prescribed a blood thinner, Aspirin  81mg , to be taken twice daily for a total of 4 weeks from surgery to help reduce your risk of getting a blood clot.  You will also resume your Plavix  on your second day after surgery (Friday) and take as normally prescribed.  Signs of a pulmonary embolus (blood clot in the lungs) include sudden short of breath, feeling lightheaded or dizzy, chest pain with a deep breath, rapid pulse rapid breathing.  Signs of a blood clot in  your arms or legs include new unexplained swelling and cramping, warm, red or darkened skin around the painful area.  Please call the office or 911 right away if these signs or symptoms develop.  PRECAUTIONS:   If you experience chest pain or shortness of breath - call 911 immediately for transfer to the hospital emergency department.   If you develop a fever greater that 101 F, purulent drainage from wound, increased redness or drainage from wound, foul odor from the wound/dressing, or calf pain - CONTACT YOUR SURGEON.                                                   FOLLOW-UP APPOINTMENTS:  If you do not already have a post-op appointment, please call the office for an appointment to be seen  by your surgeon.  Guidelines for how soon to be seen are listed in your "After Visit Summary", but are typically between 2-3 weeks after surgery.  If you have a specialized bandage, you may be told to follow up 1 week after surgery.  OTHER INSTRUCTIONS:  Knee Replacement:  Do not place pillow under knee, focus on keeping the knee straight while resting.  Place foam block, curve side up under heel at all times except when walking.  DO NOT modify, tear, cut, or change the foam block in any way.  POST-OPERATIVE OPIOID TAPER INSTRUCTIONS: It is important to wean off of your opioid medication as soon as possible. If you do not need pain medication after your surgery it is ok to stop day one. Opioids include: Codeine, Hydrocodone (Norco, Vicodin), Oxycodone (Percocet, oxycontin ) and hydromorphone  amongst others.  Long term and even short term use of opiods can cause: Increased pain response Dependence Constipation Depression Respiratory depression And more.  Withdrawal symptoms can include Flu like symptoms Nausea, vomiting And more Techniques to manage these symptoms Hydrate well Eat regular healthy meals Stay active Use relaxation techniques(deep breathing, meditating, yoga) Do Not substitute Alcohol   to help with tapering If you have been on opioids for less than two weeks and do not have pain than it is ok to stop all together.  Plan to wean off of opioids This plan should start within one week post op of your joint replacement. Maintain the same interval or time between taking each dose and first decrease the dose.  Cut the total daily intake of opioids by one tablet each day Next start to increase the time between doses. The last dose that should be eliminated is the evening dose.   MAKE SURE YOU:  Understand these instructions.  Get help right away if you are not doing well or get worse.    Thank you for letting us  be a part of your medical care team.  It is a privilege we respect greatly.  We hope these instructions will help you stay on track for a fast and full recovery!            Signed: Ebany Bowermaster A Lashante Fryberger 02/24/2024, 6:41 AM

## 2024-02-24 NOTE — Progress Notes (Signed)
 Physical Therapy Treatment Patient Details Name: Jerome Shaffer MRN: [id number] DOB: 06/11/1956 Today's Date: 02/24/2024   History of Present Illness 68 yo male s/p L TKA on 02/23/24.  PMH: bil  THA, TIA, HTN    PT Comments  Patient making excellent progress, minimal pain reported left knee. Patient has met PT goals for DC.    If plan is discharge home, recommend the following: A little help with walking and/or transfers;A little help with bathing/dressing/bathroom;Assist for transportation;Help with stairs or ramp for entrance   Can travel by private vehicle        Equipment Recommendations  None recommended by PT    Recommendations for Other Services       Precautions / Restrictions Precautions Precautions: Fall;Knee Restrictions LLE Weight Bearing Per Provider Order: Weight bearing as tolerated     Mobility  Bed Mobility Overal bed mobility: Modified Independent                  Transfers   Equipment used: Rolling walker (2 wheels) Transfers: Sit to/from Stand Sit to Stand: Supervision           General transfer comment: cues for proper hand placement and LLE position    Ambulation/Gait Ambulation/Gait assistance: Contact guard assist Gait Distance (Feet): 220 Feet Assistive device: Rolling walker (2 wheels) Gait Pattern/deviations: Step-to pattern, Step-through pattern       General Gait Details: cues for initial sequence and RW position, progression to step through pattern end of distance without incr pain   Stairs Stairs: Yes Stairs assistance: Contact guard assist Stair Management: One rail Left, Step to pattern, Forwards, With cane Number of Stairs: 5 General stair comments: cues for sequence   Wheelchair Mobility     Tilt Bed    Modified Rankin (Stroke Patients Only)       Balance Overall balance assessment: No apparent balance deficits (not formally assessed)                                           Communication Communication Communication: No apparent difficulties  Cognition Arousal: Alert Behavior During Therapy: WFL for tasks assessed/performed   PT - Cognitive impairments: No apparent impairments                       PT - Cognition Comments: did require repetitio of sequence for negotiating steps Following commands: Intact      Cueing Cueing Techniques: Verbal cues  Exercises Total Joint Exercises Ankle Circles/Pumps: AROM Quad Sets: AROM, Left Heel Slides: AROM, Left, 5 reps Straight Leg Raises: AROM, Left, 5 reps Long Arc Quad: AROM, Left, 10 reps Knee Flexion: AROM, Left, 10 reps Goniometric ROM: 0-90 left    General Comments        Pertinent Vitals/Pain Pain Assessment Pain Score: 4  Pain Location: L knee Pain Descriptors / Indicators: Discomfort Pain Intervention(s): Monitored during session, Premedicated before session    Home Living                          Prior Function            PT Goals (current goals can now be found in the care plan section) Progress towards PT goals: Progressing toward goals    Frequency    7X/week      PT Plan  Co-evaluation              AM-PAC PT 6 Clicks Mobility   Outcome Measure  Help needed turning from your back to your side while in a flat bed without using bedrails?: A Little Help needed moving from lying on your back to sitting on the side of a flat bed without using bedrails?: A Little Help needed moving to and from a bed to a chair (including a wheelchair)?: A Little Help needed standing up from a chair using your arms (e.g., wheelchair or bedside chair)?: A Little Help needed to walk in hospital room?: A Little Help needed climbing 3-5 steps with a railing? : A Little 6 Click Score: 18    End of Session Equipment Utilized During Treatment: Gait belt Activity Tolerance: Patient tolerated treatment well Patient left: in chair;with family/visitor present Nurse  Communication: Mobility status PT Visit Diagnosis: Other abnormalities of gait and mobility (R26.89)     Time: 0840-0900 PT Time Calculation (min) (ACUTE ONLY): 20 min  Charges:    $Gait Training: 8-22 mins PT General Charges $$ ACUTE PT VISIT: 1 Visit                     Darice Potters PT Acute Rehabilitation Services Office [phone]    Potters Darice Norris 02/24/2024, 10:47 AM

## 2024-02-24 NOTE — Progress Notes (Signed)
     Subjective:  Patient reports pain as mild.  Did great with physical therapy yesterday mobilizing 100 feet.  Denies distal numbness and tingling. No new concerns.  Plan for discharge home today.  Yesterday's total administered Morphine Milligram Equivalents: 103   Objective:   VITALS:   Vitals:   02/23/24 1844 02/23/24 2302 02/24/24 0253 02/24/24 0630  BP: 116/74 115/65 119/74 136/75  Pulse: 88 66 71 70  Resp: 18 18 18 18   Temp: 98.2 F (36.8 C) 98.3 F (36.8 C) 98.3 F (36.8 C) 97.9 F (36.6 C)  TempSrc:      SpO2: 96% 97% 96% 98%  Weight:      Height:        Sensation intact distally Intact pulses distally Dorsiflexion/Plantar flexion intact Incision: dressing C/D/I Compartment soft    Lab Results  Component Value Date   WBC 12.1 (H) 02/24/2024   HGB 11.8 (L) 02/24/2024   HCT 35.6 (L) 02/24/2024   MCV 93.2 02/24/2024   PLT 203 02/24/2024   BMET    Component Value Date/Time   NA 135 02/24/2024 0400   NA 144 09/24/2023 1436   K 4.4 02/24/2024 0400   CL 106 02/24/2024 0400   CO2 23 02/24/2024 0400   GLUCOSE 149 (H) 02/24/2024 0400   BUN 19 02/24/2024 0400   BUN 20 09/24/2023 1436   CREATININE 1.08 02/24/2024 0400   CALCIUM  9.2 02/24/2024 0400   EGFR 83 09/24/2023 1436   GFRNONAA >60 02/24/2024 0400      Xray: TKA components in good position, no adverse features  Assessment/Plan: 1 Day Post-Op   Principal Problem:   Primary osteoarthritis of left knee  S/p L TKA 02/23/24  Post op recs: WB: WBAT Abx: ancef  Imaging: PACU xrays DVT prophylaxis: Aspirin  81mg  BID x4 weeks, resume Plavix  postop day 2 Follow up: 2 weeks after surgery for a wound check with Dr. Edna at [facility].  Address: [address], [place], [place] [address]  Office Phone: [phone]    Jerome Shaffer 02/24/2024, 6:40 AM   Jerome Edna, MD  Contact information:   [phone] 7am-5pm epic message Dr. Edna, or call  office for patient follow up: [phone] After hours and holidays please check [email or web address] for group call information for [facility]

## 2024-02-24 NOTE — Care Management Obs Status (Signed)
 MEDICARE OBSERVATION STATUS NOTIFICATION   Patient Details  Name: Jerome Shaffer MRN: [id number] Date of Birth: July 04, 1956   Medicare Observation Status Notification Given:  Yes    Alfonse JONELLE Rex, RN 02/24/2024, 9:22 AM

## 2024-02-25 ENCOUNTER — Encounter (HOSPITAL_COMMUNITY): Payer: Self-pay | Admitting: Orthopedic Surgery

## 2024-02-29 DIAGNOSIS — R262 Difficulty in walking, not elsewhere classified: Secondary | ICD-10-CM | POA: Diagnosis not present

## 2024-02-29 DIAGNOSIS — M25662 Stiffness of left knee, not elsewhere classified: Secondary | ICD-10-CM | POA: Diagnosis not present

## 2024-02-29 DIAGNOSIS — M6281 Muscle weakness (generalized): Secondary | ICD-10-CM | POA: Diagnosis not present

## 2024-02-29 DIAGNOSIS — M1712 Unilateral primary osteoarthritis, left knee: Secondary | ICD-10-CM | POA: Diagnosis not present

## 2024-03-03 DIAGNOSIS — M6281 Muscle weakness (generalized): Secondary | ICD-10-CM | POA: Diagnosis not present

## 2024-03-03 DIAGNOSIS — M25662 Stiffness of left knee, not elsewhere classified: Secondary | ICD-10-CM | POA: Diagnosis not present

## 2024-03-03 DIAGNOSIS — M1712 Unilateral primary osteoarthritis, left knee: Secondary | ICD-10-CM | POA: Diagnosis not present

## 2024-03-03 DIAGNOSIS — R262 Difficulty in walking, not elsewhere classified: Secondary | ICD-10-CM | POA: Diagnosis not present

## 2024-03-06 ENCOUNTER — Ambulatory Visit

## 2024-03-06 DIAGNOSIS — I639 Cerebral infarction, unspecified: Secondary | ICD-10-CM | POA: Diagnosis not present

## 2024-03-07 DIAGNOSIS — M25662 Stiffness of left knee, not elsewhere classified: Secondary | ICD-10-CM | POA: Diagnosis not present

## 2024-03-07 DIAGNOSIS — M6281 Muscle weakness (generalized): Secondary | ICD-10-CM | POA: Diagnosis not present

## 2024-03-07 DIAGNOSIS — R262 Difficulty in walking, not elsewhere classified: Secondary | ICD-10-CM | POA: Diagnosis not present

## 2024-03-07 DIAGNOSIS — M1712 Unilateral primary osteoarthritis, left knee: Secondary | ICD-10-CM | POA: Diagnosis not present

## 2024-03-08 LAB — CUP PACEART REMOTE DEVICE CHECK
Date Time Interrogation Session: 20250901043909
Implantable Pulse Generator Implant Date: 20250530

## 2024-03-10 DIAGNOSIS — M25662 Stiffness of left knee, not elsewhere classified: Secondary | ICD-10-CM | POA: Diagnosis not present

## 2024-03-10 DIAGNOSIS — R262 Difficulty in walking, not elsewhere classified: Secondary | ICD-10-CM | POA: Diagnosis not present

## 2024-03-10 DIAGNOSIS — M6281 Muscle weakness (generalized): Secondary | ICD-10-CM | POA: Diagnosis not present

## 2024-03-10 DIAGNOSIS — M1712 Unilateral primary osteoarthritis, left knee: Secondary | ICD-10-CM | POA: Diagnosis not present

## 2024-03-11 ENCOUNTER — Ambulatory Visit: Payer: Self-pay | Admitting: Cardiology

## 2024-03-14 DIAGNOSIS — R262 Difficulty in walking, not elsewhere classified: Secondary | ICD-10-CM | POA: Diagnosis not present

## 2024-03-14 DIAGNOSIS — M6281 Muscle weakness (generalized): Secondary | ICD-10-CM | POA: Diagnosis not present

## 2024-03-14 DIAGNOSIS — M1712 Unilateral primary osteoarthritis, left knee: Secondary | ICD-10-CM | POA: Diagnosis not present

## 2024-03-14 DIAGNOSIS — M25662 Stiffness of left knee, not elsewhere classified: Secondary | ICD-10-CM | POA: Diagnosis not present

## 2024-03-14 NOTE — Progress Notes (Signed)
 Remote Loop Recorder Transmission

## 2024-03-16 DIAGNOSIS — M1712 Unilateral primary osteoarthritis, left knee: Secondary | ICD-10-CM | POA: Diagnosis not present

## 2024-03-16 DIAGNOSIS — M6281 Muscle weakness (generalized): Secondary | ICD-10-CM | POA: Diagnosis not present

## 2024-03-16 DIAGNOSIS — R262 Difficulty in walking, not elsewhere classified: Secondary | ICD-10-CM | POA: Diagnosis not present

## 2024-03-16 DIAGNOSIS — M25662 Stiffness of left knee, not elsewhere classified: Secondary | ICD-10-CM | POA: Diagnosis not present

## 2024-03-20 DIAGNOSIS — M6281 Muscle weakness (generalized): Secondary | ICD-10-CM | POA: Diagnosis not present

## 2024-03-20 DIAGNOSIS — R262 Difficulty in walking, not elsewhere classified: Secondary | ICD-10-CM | POA: Diagnosis not present

## 2024-03-20 DIAGNOSIS — M1712 Unilateral primary osteoarthritis, left knee: Secondary | ICD-10-CM | POA: Diagnosis not present

## 2024-03-20 DIAGNOSIS — M25662 Stiffness of left knee, not elsewhere classified: Secondary | ICD-10-CM | POA: Diagnosis not present

## 2024-03-23 DIAGNOSIS — R262 Difficulty in walking, not elsewhere classified: Secondary | ICD-10-CM | POA: Diagnosis not present

## 2024-03-23 DIAGNOSIS — M1712 Unilateral primary osteoarthritis, left knee: Secondary | ICD-10-CM | POA: Diagnosis not present

## 2024-03-23 DIAGNOSIS — M6281 Muscle weakness (generalized): Secondary | ICD-10-CM | POA: Diagnosis not present

## 2024-03-23 DIAGNOSIS — M25662 Stiffness of left knee, not elsewhere classified: Secondary | ICD-10-CM | POA: Diagnosis not present

## 2024-03-27 DIAGNOSIS — M25662 Stiffness of left knee, not elsewhere classified: Secondary | ICD-10-CM | POA: Diagnosis not present

## 2024-03-27 DIAGNOSIS — R262 Difficulty in walking, not elsewhere classified: Secondary | ICD-10-CM | POA: Diagnosis not present

## 2024-03-27 DIAGNOSIS — M6281 Muscle weakness (generalized): Secondary | ICD-10-CM | POA: Diagnosis not present

## 2024-03-27 DIAGNOSIS — M1712 Unilateral primary osteoarthritis, left knee: Secondary | ICD-10-CM | POA: Diagnosis not present

## 2024-03-30 DIAGNOSIS — M25662 Stiffness of left knee, not elsewhere classified: Secondary | ICD-10-CM | POA: Diagnosis not present

## 2024-03-30 DIAGNOSIS — M1712 Unilateral primary osteoarthritis, left knee: Secondary | ICD-10-CM | POA: Diagnosis not present

## 2024-03-30 DIAGNOSIS — M6281 Muscle weakness (generalized): Secondary | ICD-10-CM | POA: Diagnosis not present

## 2024-03-30 DIAGNOSIS — R262 Difficulty in walking, not elsewhere classified: Secondary | ICD-10-CM | POA: Diagnosis not present

## 2024-04-03 DIAGNOSIS — R262 Difficulty in walking, not elsewhere classified: Secondary | ICD-10-CM | POA: Diagnosis not present

## 2024-04-03 DIAGNOSIS — M1712 Unilateral primary osteoarthritis, left knee: Secondary | ICD-10-CM | POA: Diagnosis not present

## 2024-04-03 DIAGNOSIS — M25662 Stiffness of left knee, not elsewhere classified: Secondary | ICD-10-CM | POA: Diagnosis not present

## 2024-04-03 DIAGNOSIS — M6281 Muscle weakness (generalized): Secondary | ICD-10-CM | POA: Diagnosis not present

## 2024-04-03 NOTE — Progress Notes (Signed)
 Remote Loop Recorder Transmission

## 2024-04-05 ENCOUNTER — Encounter: Payer: Self-pay | Admitting: Cardiovascular Disease

## 2024-04-06 ENCOUNTER — Ambulatory Visit

## 2024-04-06 DIAGNOSIS — I639 Cerebral infarction, unspecified: Secondary | ICD-10-CM | POA: Diagnosis not present

## 2024-04-06 LAB — CUP PACEART REMOTE DEVICE CHECK
Date Time Interrogation Session: 20251001233542
Implantable Pulse Generator Implant Date: 20250530

## 2024-04-06 NOTE — Progress Notes (Signed)
 Remote Loop Recorder Transmission

## 2024-04-07 ENCOUNTER — Ambulatory Visit: Payer: Self-pay | Admitting: Cardiology

## 2024-04-10 DIAGNOSIS — M25662 Stiffness of left knee, not elsewhere classified: Secondary | ICD-10-CM | POA: Diagnosis not present

## 2024-05-08 ENCOUNTER — Ambulatory Visit

## 2024-05-08 DIAGNOSIS — I639 Cerebral infarction, unspecified: Secondary | ICD-10-CM | POA: Diagnosis not present

## 2024-05-08 LAB — CUP PACEART REMOTE DEVICE CHECK
Date Time Interrogation Session: 20251102235138
Implantable Pulse Generator Implant Date: 20250530

## 2024-05-09 ENCOUNTER — Ambulatory Visit: Payer: Self-pay | Admitting: Cardiology

## 2024-05-11 NOTE — Progress Notes (Signed)
 Remote Loop Recorder Transmission

## 2024-05-16 ENCOUNTER — Other Ambulatory Visit: Payer: Self-pay | Admitting: Neurology

## 2024-06-08 ENCOUNTER — Encounter

## 2024-06-10 ENCOUNTER — Ambulatory Visit: Attending: Cardiology

## 2024-06-10 DIAGNOSIS — I639 Cerebral infarction, unspecified: Secondary | ICD-10-CM | POA: Diagnosis not present

## 2024-06-12 LAB — CUP PACEART REMOTE DEVICE CHECK
Date Time Interrogation Session: 20251205232932
Implantable Pulse Generator Implant Date: 20250530

## 2024-06-14 NOTE — Progress Notes (Signed)
 Remote Loop Recorder Transmission

## 2024-06-23 ENCOUNTER — Ambulatory Visit: Payer: Self-pay | Admitting: Cardiology

## 2024-07-10 ENCOUNTER — Encounter

## 2024-07-11 ENCOUNTER — Ambulatory Visit

## 2024-07-11 DIAGNOSIS — I639 Cerebral infarction, unspecified: Secondary | ICD-10-CM | POA: Diagnosis not present

## 2024-07-11 LAB — CUP PACEART REMOTE DEVICE CHECK
Date Time Interrogation Session: 20260105233736
Implantable Pulse Generator Implant Date: 20250530

## 2024-07-13 NOTE — Progress Notes (Signed)
 Remote Loop Recorder Transmission

## 2024-07-15 ENCOUNTER — Ambulatory Visit: Payer: Self-pay | Admitting: Cardiology

## 2024-08-10 ENCOUNTER — Encounter

## 2024-08-11 ENCOUNTER — Ambulatory Visit: Payer: Self-pay

## 2024-08-11 LAB — CUP PACEART REMOTE DEVICE CHECK
Date Time Interrogation Session: 20260205233210
Implantable Pulse Generator Implant Date: 20250530

## 2024-09-11 ENCOUNTER — Ambulatory Visit

## 2024-10-12 ENCOUNTER — Ambulatory Visit

## 2024-11-12 ENCOUNTER — Ambulatory Visit

## 2024-12-13 ENCOUNTER — Ambulatory Visit

## 2025-01-13 ENCOUNTER — Ambulatory Visit

## 2025-02-13 ENCOUNTER — Ambulatory Visit

## 2025-03-16 ENCOUNTER — Ambulatory Visit

## 2025-04-16 ENCOUNTER — Ambulatory Visit

## 2025-05-17 ENCOUNTER — Ambulatory Visit

## 2025-06-17 ENCOUNTER — Ambulatory Visit

## 2025-07-18 ENCOUNTER — Ambulatory Visit
